# Patient Record
Sex: Female | Born: 1940
Health system: Southern US, Community
[De-identification: ages and names within clinical notes are randomized; demographics above are authoritative.]

## PROBLEM LIST (undated history)

## (undated) DIAGNOSIS — R319 Hematuria, unspecified: Secondary | ICD-10-CM

## (undated) DIAGNOSIS — N200 Calculus of kidney: Secondary | ICD-10-CM

## (undated) DIAGNOSIS — I340 Nonrheumatic mitral (valve) insufficiency: Secondary | ICD-10-CM

## (undated) DIAGNOSIS — R9430 Abnormal result of cardiovascular function study, unspecified: Secondary | ICD-10-CM

## (undated) DIAGNOSIS — I4891 Unspecified atrial fibrillation: Secondary | ICD-10-CM

## (undated) DIAGNOSIS — I8392 Asymptomatic varicose veins of left lower extremity: Secondary | ICD-10-CM

## (undated) DIAGNOSIS — I341 Nonrheumatic mitral (valve) prolapse: Secondary | ICD-10-CM

## (undated) DIAGNOSIS — N189 Chronic kidney disease, unspecified: Secondary | ICD-10-CM

## (undated) DIAGNOSIS — IMO0002 Reserved for concepts with insufficient information to code with codable children: Secondary | ICD-10-CM

## (undated) DIAGNOSIS — C439 Malignant melanoma of skin, unspecified: Secondary | ICD-10-CM

## (undated) DIAGNOSIS — E785 Hyperlipidemia, unspecified: Secondary | ICD-10-CM

## (undated) DIAGNOSIS — Z79899 Other long term (current) drug therapy: Secondary | ICD-10-CM

## (undated) DIAGNOSIS — R87619 Unspecified abnormal cytological findings in specimens from cervix uteri: Secondary | ICD-10-CM

## (undated) DIAGNOSIS — K611 Rectal abscess: Secondary | ICD-10-CM

## (undated) DIAGNOSIS — Z87442 Personal history of urinary calculi: Secondary | ICD-10-CM

## (undated) DIAGNOSIS — I700 Atherosclerosis of aorta: Secondary | ICD-10-CM

## (undated) HISTORY — DX: Atherosclerosis of aorta: I70.0

## (undated) HISTORY — DX: Hyperlipidemia, unspecified: E78.5

## (undated) HISTORY — DX: Malignant melanoma of skin, unspecified: C43.9

## (undated) HISTORY — DX: Nonrheumatic mitral (valve) prolapse: I34.1

## (undated) HISTORY — PX: COLONOSCOPY: SHX174

## (undated) HISTORY — DX: Calculus of kidney: N20.0

## (undated) HISTORY — DX: Rectal abscess: K61.1

## (undated) HISTORY — DX: Chronic kidney disease, unspecified: N18.9

## (undated) HISTORY — DX: Abnormal result of cardiovascular function study, unspecified: R94.30

## (undated) HISTORY — DX: Unspecified abnormal cytological findings in specimens from cervix uteri: R87.619

## (undated) HISTORY — DX: Unspecified atrial fibrillation: I48.91

## (undated) HISTORY — PX: GYNECOLOGIC CRYOSURGERY: SHX857

## (undated) HISTORY — PX: TONSILLECTOMY AND ADENOIDECTOMY: SHX28

## (undated) HISTORY — DX: Nonrheumatic mitral (valve) insufficiency: I34.0

## (undated) HISTORY — DX: Reserved for concepts with insufficient information to code with codable children: IMO0002

## (undated) HISTORY — PX: OTHER SURGICAL HISTORY: SHX169

## (undated) HISTORY — DX: Other long term (current) drug therapy: Z79.899

---

## 1979-06-03 HISTORY — PX: GYNECOLOGIC CRYOSURGERY: SHX857

## 1998-10-25 ENCOUNTER — Other Ambulatory Visit: Admission: RE | Admit: 1998-10-25 | Discharge: 1998-10-25 | Payer: Self-pay | Admitting: *Deleted

## 1999-10-17 ENCOUNTER — Other Ambulatory Visit: Admission: RE | Admit: 1999-10-17 | Discharge: 1999-10-17 | Payer: Self-pay | Admitting: *Deleted

## 1999-12-29 ENCOUNTER — Encounter: Admission: RE | Admit: 1999-12-29 | Discharge: 1999-12-29 | Payer: Self-pay | Admitting: *Deleted

## 1999-12-29 ENCOUNTER — Encounter: Payer: Self-pay | Admitting: *Deleted

## 2000-04-09 ENCOUNTER — Encounter: Payer: Self-pay | Admitting: *Deleted

## 2000-04-09 ENCOUNTER — Encounter: Admission: RE | Admit: 2000-04-09 | Discharge: 2000-04-09 | Payer: Self-pay | Admitting: *Deleted

## 2000-10-24 ENCOUNTER — Other Ambulatory Visit: Admission: RE | Admit: 2000-10-24 | Discharge: 2000-10-24 | Payer: Self-pay | Admitting: *Deleted

## 2001-03-28 ENCOUNTER — Encounter: Admission: RE | Admit: 2001-03-28 | Discharge: 2001-03-28 | Payer: Self-pay | Admitting: *Deleted

## 2001-03-28 ENCOUNTER — Encounter: Payer: Self-pay | Admitting: *Deleted

## 2001-10-24 ENCOUNTER — Other Ambulatory Visit: Admission: RE | Admit: 2001-10-24 | Discharge: 2001-10-24 | Payer: Self-pay | Admitting: *Deleted

## 2002-03-25 ENCOUNTER — Encounter: Admission: RE | Admit: 2002-03-25 | Discharge: 2002-03-25 | Payer: Self-pay | Admitting: *Deleted

## 2002-03-25 ENCOUNTER — Encounter: Payer: Self-pay | Admitting: *Deleted

## 2002-08-02 HISTORY — PX: HYSTEROSCOPY: SHX211

## 2002-11-19 ENCOUNTER — Other Ambulatory Visit: Admission: RE | Admit: 2002-11-19 | Discharge: 2002-11-19 | Payer: Self-pay | Admitting: *Deleted

## 2002-12-02 ENCOUNTER — Encounter: Admission: RE | Admit: 2002-12-02 | Discharge: 2002-12-02 | Payer: Self-pay | Admitting: *Deleted

## 2002-12-02 ENCOUNTER — Encounter: Payer: Self-pay | Admitting: *Deleted

## 2003-04-09 ENCOUNTER — Encounter: Payer: Self-pay | Admitting: *Deleted

## 2003-04-09 ENCOUNTER — Encounter: Admission: RE | Admit: 2003-04-09 | Discharge: 2003-04-09 | Payer: Self-pay | Admitting: *Deleted

## 2003-11-26 ENCOUNTER — Other Ambulatory Visit: Admission: RE | Admit: 2003-11-26 | Discharge: 2003-11-26 | Payer: Self-pay | Admitting: *Deleted

## 2003-12-02 ENCOUNTER — Encounter: Admission: RE | Admit: 2003-12-02 | Discharge: 2003-12-02 | Payer: Self-pay | Admitting: Internal Medicine

## 2004-02-02 ENCOUNTER — Ambulatory Visit (HOSPITAL_COMMUNITY): Admission: RE | Admit: 2004-02-02 | Discharge: 2004-02-02 | Payer: Self-pay | Admitting: Radiology

## 2004-02-25 ENCOUNTER — Ambulatory Visit (HOSPITAL_COMMUNITY): Admission: RE | Admit: 2004-02-25 | Discharge: 2004-02-25 | Payer: Self-pay | Admitting: Radiology

## 2004-04-12 ENCOUNTER — Encounter: Admission: RE | Admit: 2004-04-12 | Discharge: 2004-04-12 | Payer: Self-pay | Admitting: *Deleted

## 2004-11-30 ENCOUNTER — Other Ambulatory Visit: Admission: RE | Admit: 2004-11-30 | Discharge: 2004-11-30 | Payer: Self-pay | Admitting: Obstetrics and Gynecology

## 2004-12-06 ENCOUNTER — Encounter: Admission: RE | Admit: 2004-12-06 | Discharge: 2004-12-06 | Payer: Self-pay | Admitting: Internal Medicine

## 2004-12-06 ENCOUNTER — Encounter: Admission: RE | Admit: 2004-12-06 | Discharge: 2004-12-06 | Payer: Self-pay | Admitting: Urology

## 2004-12-17 ENCOUNTER — Observation Stay (HOSPITAL_COMMUNITY): Admission: EM | Admit: 2004-12-17 | Discharge: 2004-12-17 | Payer: Self-pay | Admitting: Emergency Medicine

## 2004-12-27 ENCOUNTER — Ambulatory Visit: Payer: Self-pay | Admitting: Cardiology

## 2005-01-04 ENCOUNTER — Ambulatory Visit: Payer: Self-pay

## 2005-01-06 ENCOUNTER — Encounter: Admission: RE | Admit: 2005-01-06 | Discharge: 2005-01-06 | Payer: Self-pay | Admitting: Internal Medicine

## 2005-01-12 ENCOUNTER — Ambulatory Visit: Payer: Self-pay | Admitting: Cardiology

## 2005-05-01 ENCOUNTER — Encounter: Admission: RE | Admit: 2005-05-01 | Discharge: 2005-05-01 | Payer: Self-pay | Admitting: *Deleted

## 2005-06-02 ENCOUNTER — Other Ambulatory Visit: Admission: RE | Admit: 2005-06-02 | Discharge: 2005-06-02 | Payer: Self-pay | Admitting: Obstetrics and Gynecology

## 2005-06-08 ENCOUNTER — Encounter: Admission: RE | Admit: 2005-06-08 | Discharge: 2005-06-08 | Payer: Self-pay | Admitting: Internal Medicine

## 2005-12-04 ENCOUNTER — Encounter: Admission: RE | Admit: 2005-12-04 | Discharge: 2005-12-04 | Payer: Self-pay | Admitting: Obstetrics & Gynecology

## 2005-12-04 ENCOUNTER — Other Ambulatory Visit: Admission: RE | Admit: 2005-12-04 | Discharge: 2005-12-04 | Payer: Self-pay | Admitting: Obstetrics & Gynecology

## 2006-05-23 ENCOUNTER — Encounter: Admission: RE | Admit: 2006-05-23 | Discharge: 2006-05-23 | Payer: Self-pay | Admitting: Obstetrics & Gynecology

## 2006-05-28 ENCOUNTER — Encounter: Admission: RE | Admit: 2006-05-28 | Discharge: 2006-05-28 | Payer: Self-pay | Admitting: Urology

## 2006-12-06 ENCOUNTER — Other Ambulatory Visit: Admission: RE | Admit: 2006-12-06 | Discharge: 2006-12-06 | Payer: Self-pay | Admitting: Obstetrics & Gynecology

## 2007-04-02 ENCOUNTER — Encounter: Admission: RE | Admit: 2007-04-02 | Discharge: 2007-04-02 | Payer: Self-pay | Admitting: Urology

## 2007-06-06 ENCOUNTER — Encounter: Admission: RE | Admit: 2007-06-06 | Discharge: 2007-06-06 | Payer: Self-pay | Admitting: Obstetrics & Gynecology

## 2007-12-09 ENCOUNTER — Other Ambulatory Visit: Admission: RE | Admit: 2007-12-09 | Discharge: 2007-12-09 | Payer: Self-pay | Admitting: Obstetrics & Gynecology

## 2008-06-09 ENCOUNTER — Encounter: Admission: RE | Admit: 2008-06-09 | Discharge: 2008-06-09 | Payer: Self-pay | Admitting: Internal Medicine

## 2008-07-08 ENCOUNTER — Ambulatory Visit: Payer: Self-pay | Admitting: Internal Medicine

## 2008-07-28 ENCOUNTER — Ambulatory Visit: Payer: Self-pay

## 2008-07-28 ENCOUNTER — Encounter: Payer: Self-pay | Admitting: Cardiology

## 2008-08-11 ENCOUNTER — Ambulatory Visit: Payer: Self-pay | Admitting: Cardiology

## 2009-06-15 ENCOUNTER — Encounter: Admission: RE | Admit: 2009-06-15 | Discharge: 2009-06-15 | Payer: Self-pay | Admitting: Internal Medicine

## 2009-11-11 ENCOUNTER — Ambulatory Visit: Payer: Self-pay | Admitting: Internal Medicine

## 2009-11-11 ENCOUNTER — Telehealth: Payer: Self-pay | Admitting: Cardiology

## 2009-11-11 ENCOUNTER — Ambulatory Visit: Payer: Self-pay | Admitting: Cardiology

## 2009-12-02 ENCOUNTER — Telehealth: Payer: Self-pay | Admitting: Cardiology

## 2009-12-02 ENCOUNTER — Ambulatory Visit: Payer: Self-pay | Admitting: Internal Medicine

## 2009-12-02 ENCOUNTER — Ambulatory Visit: Payer: Self-pay | Admitting: Cardiology

## 2009-12-14 ENCOUNTER — Ambulatory Visit: Payer: Self-pay | Admitting: Cardiology

## 2009-12-14 ENCOUNTER — Encounter: Payer: Self-pay | Admitting: Cardiology

## 2010-02-04 ENCOUNTER — Encounter: Payer: Self-pay | Admitting: Cardiology

## 2010-02-07 ENCOUNTER — Ambulatory Visit: Payer: Self-pay | Admitting: Cardiology

## 2010-02-24 ENCOUNTER — Encounter: Payer: Self-pay | Admitting: Cardiology

## 2010-06-20 ENCOUNTER — Encounter: Admission: RE | Admit: 2010-06-20 | Discharge: 2010-06-20 | Payer: Self-pay | Admitting: Obstetrics & Gynecology

## 2010-08-08 ENCOUNTER — Ambulatory Visit: Payer: Self-pay | Admitting: Cardiology

## 2010-10-23 ENCOUNTER — Encounter: Payer: Self-pay | Admitting: Urology

## 2010-10-23 ENCOUNTER — Encounter: Payer: Self-pay | Admitting: Radiology

## 2010-11-01 NOTE — Progress Notes (Signed)
Summary: a-fib    Phone Note Call from Patient   Caller: Pts husband Dr Maple Hudson Summary of Call: Received phone call that pt was in a-fib, she ahs taken her calcium channel blocker and it has not helped, pts husband reports that she also had an episode about a month ago but it broke w/med, will bring pt in for EKG at 1pm today Initial call taken by: Meredith Staggers, RN,  November 11, 2009 12:13 PM  Follow-up for Phone Call        pt in for EKG HR 160's, pt seen by Dr Warnell Forester, RN  November 11, 2009 2:03 PM

## 2010-11-01 NOTE — Assessment & Plan Note (Signed)
Summary: rov  Medications Added SIMVASTATIN 40 MG TABS (SIMVASTATIN) Take one tablet by mouth daily at bedtime * VITAMIN D 16109 weekly * ESTROGEN VAGINAL RING  DILT-CD 120 MG XR24H-CAP (DILTIAZEM HCL COATED BEADS) as needed DILT-CD 120 MG XR24H-CAP (DILTIAZEM HCL COATED BEADS) Take 1 capsule by mouth once a day      Allergies Added: * TETANUS  History of Present Illness: [name] (the wife of Dr. Stevphen Meuse) is a 70 y/o woman with h/o lone PAF and hyperlipidemia. She is followed by Dr. Myrtis Ser who has not seen her in several years as she has been doing well.  She tells me over the past 7 months has had 2 episodes of AF after drinking alcohol or having some caffeine. Both episodes were short and terminated about an hour after taking diltiazem.  Today woke up with palpitations. No CP or SOB. Took diltiazem but palpitations persisted. Came to office and had ECG which showed AF with RVR 169. While in office AF broke back to sinus.  Continue to exercise regularly without CP or SOB.  Current Medications (verified): 1)  Simvastatin 40 Mg Tabs (Simvastatin) .... Take One Tablet By Mouth Daily At Bedtime 2)  Vitamin D .... 50000 Weekly 3)  Estrogen Vaginal Ring 4)  Dilt-Cd 120 Mg Xr24h-Cap (Diltiazem Hcl Coated Beads) .... As Needed  Allergies (verified): 1)  * Tetanus  Past History:  Past Medical History: PAF   --echo normal with tirvial MR Hyperlipidemia  Review of Systems       As per HPI and past medical history; otherwise all systems negative.   Vital Signs:  Patient profile:   70 year old female Weight:      140 pounds Pulse rate:   169 / minute BP sitting:   112 / 60  (left arm)  Vitals Entered By: Meredith Staggers, RN (November 11, 2009 1:46 PM)  Physical Exam  General:  Gen: well appearing. thin no resp difficulty HEENT: normal Neck: supple. no JVD. Carotids 2+ bilat; no bruits. No lymphadenopathy or thryomegaly appreciated. Cor: PMI nondisplaced. Regular  rate & rhythm. No rubs, gallops, murmur. Lungs: clear Abdomen: soft, nontender, nondistended.Good bowel sounds. Extremities: no cyanosis, clubbing, rash, edema Neuro: alert & orientedx3, cranial nerves grossly intact. moves all 4 extremities w/o difficulty. affect pleasant    Impression & Recommendations:  Problem # 1:  ATRIAL FIBRILLATION (ICD-427.31) She now has had 3 episodes of lone PAF (CHADS 0) in past 7 months with decreasing triggers and despite signifcant lifestyle modifications to prevent. I discussed the options with her and her husband.  1) Continue current therapy with diltiazem as needed  2)  Use flecainide as pill-in-pocket approach 3) Start flecainide 50 two times a day + low dose AV nodal blocker to help prevent PAF recurrence  Given the increasing frequency I favor the 3rd option but she seems to favor the 2nd. We did discuss the risks of proarrythmia. I will discuss with Dr. Myrtis Ser as well. Would suggest ETT to evaluate for underlying CAD.  Increase ASA to 325.  I discussed with Dr. Myrtis Ser and he favored trying her on Diltiazem 120qd (verus as needed) and proceeding with ETT. If ETT negative and she continues to have PAF, he will then consider flecainide with pill in pocket approach. I think this is a great way to approach it and we gave her a script for the diltaizem and arranged the stress test.   Orders: EKG w/ Interpretation (93000) Treadmill (Treadmill)  Patient Instructions:  1)  Your physician has requested that you have an exercise tolerance test.  For further information please visit [email or web address]  Please also follow instruction sheet, as given. Prescriptions: DILT-CD 120 MG XR24H-CAP (DILTIAZEM HCL COATED BEADS) Take 1 capsule by mouth once a day  #30 x 6   Entered by:   Meredith Staggers, RN   Authorized by:   Dolores Patty, MD, [facility]   Signed by:   Meredith Staggers, RN on 11/11/2009   Method used:   [facility]        [facility] [address]  [phone]* (retail)       [address]       [place], [place]  [address]       Ph: [phone] or [phone]       Fax: [phone]   RxID:   [id number]

## 2010-11-01 NOTE — Assessment & Plan Note (Signed)
Summary: [place] [facility]  Medications Added FLECAINIDE ACETATE 50 MG TABS (FLECAINIDE ACETATE) Take one tablet by mouth every 12 hours, take 4 tabs tonight      Allergies Added:   Visit Type:  add on Primary Provider:  Rodrigo Ran MD  CC:  a fib.  History of Present Illness: [name] (the wife of Dr. Stevphen Meuse) is a 70 y/o woman with h/o lone PAF and hyperlipidemia. She is followed by Dr. Myrtis Ser.  I saw her on February 10 for an episode of AF. Given that it was her 3rd episode in the 7 months we discussed possible initiation of Flecainide but given the fact that sehe didn't have recent stress test we decided to change her diltiazem from as needed to scheduled dosing. She has stress test scheduled for late March.  She walked into the office today c/o recurrent palpitations. Last night had mild irregular HR so she took an extra dose of diltiazem but HR still fast. Palpitations recurred today so she came for evalaution. ECG here shows AF at 124 bpm.   Had emergency root canal on Tuesday and has been taking pain meds.  HR when in sinus rhythm tends to be in the 80s-90s.   Current Medications (verified): 1)  Simvastatin 40 Mg Tabs (Simvastatin) .... Take One Tablet By Mouth Daily At Bedtime 2)  Vitamin D .... 50000 Weekly 3)  Estrogen Vaginal Ring 4)  Dilt-Cd 120 Mg Xr24h-Cap (Diltiazem Hcl Coated Beads) .... Take 1 Capsule By Mouth Once A Day  Allergies (verified): 1)  * Tetanus  Past History:  Past Medical History: Last updated: 11/11/2009 PAF   --echo normal with tirvial MR Hyperlipidemia  Review of Systems       As per HPI and past medical history; otherwise all systems negative.   Vital Signs:  Patient profile:   70 year old female Height:      66 inches Weight:      142 pounds BMI:     23.00 Pulse rate:   124 / minute BP sitting:   102 / 64  (left arm) Cuff size:   regular  Vitals Entered By: Hardin Negus, RMA (December 02, 2009 4:53 PM)  Physical  Exam  General:  Gen: well appearing. thin no resp difficulty HEENT: normal Neck: supple. no JVD. Carotids 2+ bilat; no bruits. No lymphadenopathy or thryomegaly appreciated. Cor: PMI nondisplaced.Irregular tachy. No rubs, gallops, murmur. Lungs: clear Abdomen: soft, nontender, nondistended.Good bowel sounds. Extremities: no cyanosis, clubbing, rash, edema Neuro: alert & orientedx3, cranial nerves grossly intact. moves all 4 extremities w/o difficulty. affect pleasant    Impression & Recommendations:  Problem # 1:  ATRIAL FIBRILLATION (ICD-427.31) Recurrent with 4 known symptomatic episodes in 8 months despite calcium channel blockade. Will start low-dose flelcainide at 50 two times a day. She will take 200mg  today. May need to increase to 75mg  (or 100)  two times a day to keep AF in check. Continue diltiazem and ASA 325. We have moved ETT up to next week to rule out pro-arrhthymia effect of Flecainide (we discussed this potential side effect).  F/u with Dr. Myrtis Ser.   Other Orders: EKG w/ Interpretation (93000)  Patient Instructions: 1)  Flecanide 50mg  two times a day, take 4 tabs tonight Prescriptions: FLECAINIDE ACETATE 50 MG TABS (FLECAINIDE ACETATE) Take one tablet by mouth every 12 hours, take 4 tabs tonight  #64 x 3   Entered by:   Meredith Staggers, RN   Authorized by:  Dolores Patty, MD, [facility]   Signed by:   Meredith Staggers, RN on 12/02/2009   Method used:   [facility] to        [facility] [address] [phone]* (retail)       [address]       [place], [place]  [address]       Ph: [phone] or [phone]       Fax: [phone]   RxID:   [id number]

## 2010-11-01 NOTE — Assessment & Plan Note (Signed)
Summary: per check out/sf  Medications Added FLECAINIDE ACETATE 50 MG TABS (FLECAINIDE ACETATE) Take one tablet by mouth every 12 hours FLECAINIDE ACETATE 50 MG TABS (FLECAINIDE ACETATE) Take one tablet by mouth every 12 hours      Allergies Added:   Visit Type:  Follow-up Primary Provider:  Rodrigo Ran MD  CC:  Atrial fibrillation.  History of Present Illness: The patient is seen for followup of atrial fibrillation.  She has not had any recurrent atrial fib since low-dose flecainide was started.  Of course her episodes in the past had been very infrequent.  She is only 50 mg b.i.d.  We did a treadmill to be sure that she has no stress induced ectopy.  She feels well and is not having any particular problems.  Current Medications (verified): 1)  Simvastatin 40 Mg Tabs (Simvastatin) .... Take One Tablet By Mouth Daily At Bedtime 2)  Vitamin D .... 50000 Weekly 3)  Estrogen Vaginal Ring 4)  Dilt-Cd 120 Mg Xr24h-Cap (Diltiazem Hcl Coated Beads) .... Take 1 Capsule By Mouth Once A Day 5)  Flecainide Acetate 50 Mg Tabs (Flecainide Acetate) .... Take One Tablet By Mouth Every 12 Hours  Allergies (verified): 1)  * Tetanus  Past History:  Past Medical History: PAF --echo normal with tirvial MR  / recurrent episodes  March, 2011... CHADS2 score 0 EF 55-65%... echo... October, 2009 Mitral regurgitation mild.... echo.... October, 2009 Hyperlipidemia Flecainide Rx.... started December 02, 2009  /  treadmill December 14, 2009.... no exercise-induced ectopy  Review of Systems       Patient denies fever, chills, headache, sweats, rash, change in vision, change in hearing, chest pain, cough, nausea vomiting, urinary symptoms.  All other systems are reviewed and are negative  Vital Signs:  Patient profile:   70 year old female Height:      66 inches Weight:      137 pounds BMI:     22.19 Pulse rate:   75 / minute BP sitting:   104 / 66  (left arm) Cuff size:   regular  Vitals Entered By:  Hardin Negus, RMA (Feb 07, 2010 2:04 PM)  Physical Exam  General:  patient is quite stable in general. Eyes:  no xanthelasma. Neck:  no jugular venous distention. Lungs:  lungs are clear.  Respiratory effort is nonlabored. Heart:  cardiac exam reveals an S1-S2.  There are no clicks or significant murmurs. Abdomen:  abdomen is soft. Extremities:  no peripheral edema. Psych:  patient is oriented to person time and place.  Affect is normal.   Impression & Recommendations:  Problem # 1:  MITRAL REGURGITATION (ICD-396.3) mitral regurgitation is mild.  No further workup is needed.  Problem # 2:  HYPERLIPIDEMIA-MIXED (ICD-272.4)  Her updated medication list for this problem includes:    Simvastatin 40 Mg Tabs (Simvastatin) .Marland Kitchen... Take one tablet by mouth daily at bedtime Lipids are treated.  Problem # 3:  ATRIAL FIBRILLATION (ICD-427.31)  Her updated medication list for this problem includes:    Flecainide Acetate 50 Mg Tabs (Flecainide acetate) .Marland Kitchen... Take one tablet by mouth every 12 hours Rhythm is stable on physical exam.  She is doing well.  I decided to obtain a trough level.  It would be very unlikely that her level is high. The information would be helpful going forward if we were to decide to go to a higher dose.  Followup in 6 months.  Patient Instructions: 1)  Follow up in 6 months Prescriptions:  FLECAINIDE ACETATE 50 MG TABS (FLECAINIDE ACETATE) Take one tablet by mouth every 12 hours  #180 x 4   Entered by:   Hardin Negus, RMA   Authorized by:   Talitha Givens, MD, [facility]   Signed by:   Hardin Negus, RMA on 02/07/2010   Method used:   [facility] to        [facility] [address] [phone]* (retail)       [address]       [place], [place]  [address]       Ph: [phone] or [phone]       Fax: [phone]   RxID:   [id number] DILT-CD 120 MG XR24H-CAP (DILTIAZEM HCL COATED BEADS) Take 1 capsule by mouth once a day  #90 x 4    Entered by:   Hardin Negus, RMA   Authorized by:   Talitha Givens, MD, [facility]   Signed by:   Hardin Negus, RMA on 02/07/2010   Method used:   Electronically to        [facility] [address] [phone]* (retail)       [address]       [place], [place]  [address]       Ph: [phone] or [phone]       Fax: [phone]   RxID:   [id number]

## 2010-11-01 NOTE — Miscellaneous (Signed)
  Clinical Lists Changes  Problems: Added new problem of * FLECAINIDE RX Added new problem of MITRAL REGURGITATION (ICD-396.3) Observations: Added new observation of PAST MED HX: PAF --echo normal with tirvial MR EF 55-65%... echo... October, 2009 Mitral regurgitation mild.... echo.... October, 2009 Hyperlipidemia Flecainide Rx.... started December 02, 2009  (12/14/2009 12:29) Added new observation of PRIMARY MD: Rodrigo Ran MD (12/14/2009 12:29)       Past History:  Past Medical History: PAF --echo normal with tirvial MR EF 55-65%... echo... October, 2009 Mitral regurgitation mild.... echo.... October, 2009 Hyperlipidemia Flecainide Rx.... started December 02, 2009

## 2010-11-01 NOTE — Assessment & Plan Note (Signed)
Summary: [facility]    Visit Type:  treadmill Primary Provider:  Rodrigo Ran MD  CC:  atrial fibrillation.  History of Present Illness: The patient was here today for a standard treadmill.  She is on Flecainide 50 mg p.o. b.i.d.  Treadmill is done to be sure that she does not have any exercise induced arrhythmias.  She exercised well.  There were no arrhythmias.  She has not had any atrial fib since low-dose flecainide was started.  We will obtain a trough level.  She will remain on low dose diltiazem along with the flecainide.  If she has a recurrent episode she will take an extra dose of 100 mg of flecainide.  I will see her back in 2 months.  Allergies: 1)  * Tetanus

## 2010-11-01 NOTE — Miscellaneous (Signed)
  Clinical Lists Changes  Observations: Added new observation of PAST MED HX: PAF --echo normal with tirvial MR  / recurrent episodes  March, 2011 EF 55-65%... echo... October, 2009 Mitral regurgitation mild.... echo.... October, 2009 Hyperlipidemiaa Flecainide Rx.... started December 02, 2009  /  treadmill December 14, 2009.... no exercise-induced ectopy  (02/04/2010 13:07) Added new observation of PRIMARY MD: Rodrigo Ran MD (02/04/2010 13:07)       Past History:  Past Medical History: PAF --echo normal with tirvial MR  / recurrent episodes  March, 2011 EF 55-65%... echo... October, 2009 Mitral regurgitation mild.... echo.... October, 2009 Hyperlipidemiaa Flecainide Rx.... started December 02, 2009  /  treadmill December 14, 2009.... no exercise-induced ectopy

## 2010-11-01 NOTE — Progress Notes (Signed)
Summary: a-fib   Phone Note Call from Patient   Caller: husband Dr Maple Hudson  [phone] Summary of Call: pts husband called and reported she still having a-fib and wanted to know if it was ok to take extra diltiazem, per Dr Gala Romney that is ok  Follow-up for Phone Call        pt walked into the office before she could be called backed, see nurse visit Meredith Staggers, RN  December 02, 2009 3:48 PM      Appended Document: a-fib Saw patient as a walk in complaining of possibly being in atrial fib. She states that she took another long acting Diltiazem at about 320 pm because her heart rate was fast. EKG showed atrial fib rate 120 to 140 with a BP of 120/80 and RR of 18 .Discussed case with Dr. Gala Romney...he advised I give her Diltiazem 60mg  by mouth...given at 345 pm. and he will see her for an MD visit.

## 2010-11-01 NOTE — Assessment & Plan Note (Signed)
Summary: f21m  Medications Added CALCIUM CARBONATE-VITAMIN D 600-400 MG-UNIT  TABS (CALCIUM CARBONATE-VITAMIN D) once daily VITAMIN B COMPLEX-C   CAPS (B COMPLEX-C) once daily ASPIRIN EC 325 MG TBEC (ASPIRIN) Take one tablet by mouth daily FISH OIL   OIL (FISH OIL) 1000mg  two times a day MULTIVITAMINS   TABS (MULTIPLE VITAMIN) once daily MAGNESIUM 500 MG TABS (MAGNESIUM) once daily      Allergies Added:   Visit Type:  Follow-up Primary Provider:  Rodrigo Ran MD  CC:  atrial fibrillation.  History of Present Illness: The patient is seen for followup of paroxysmal atrial fibrillation.  I saw her last May, 2011.  We checked a flecainide level on March 03, 2010.  Level was 0.28 which is below therapeutic range.  The patient has had no recurrence of her atrial fibrillation.  She is fully active.  Current Medications (verified): 1)  Simvastatin 40 Mg Tabs (Simvastatin) .... Take One Tablet By Mouth Daily At Bedtime 2)  Vitamin D .... 50000 Weekly 3)  Estrogen Vaginal Ring 4)  Dilt-Cd 120 Mg Xr24h-Cap (Diltiazem Hcl Coated Beads) .... Take 1 Capsule By Mouth Once A Day 5)  Flecainide Acetate 50 Mg Tabs (Flecainide Acetate) .... Take One Tablet By Mouth Every 12 Hours 6)  Calcium Carbonate-Vitamin D 600-400 Mg-Unit  Tabs (Calcium Carbonate-Vitamin D) .... Once Daily 7)  Vitamin B Complex-C   Caps (B Complex-C) .... Once Daily 8)  Aspirin Ec 325 Mg Tbec (Aspirin) .... Take One Tablet By Mouth Daily 9)  Fish Oil   Oil (Fish Oil) .... 1000mg  Two Times A Day 10)  Multivitamins   Tabs (Multiple Vitamin) .... Once Daily 11)  Magnesium 500 Mg Tabs (Magnesium) .... Once Daily  Allergies (verified): 1)  * Tetanus  Past History:  Past Medical History: Last updated: 02/07/2010 PAF --echo normal with tirvial MR  / recurrent episodes  March, 2011... CHADS2 score 0 EF 55-65%... echo... October, 2009 Mitral regurgitation mild.... echo.... October, 2009 Hyperlipidemia Flecainide Rx.... started  December 02, 2009  /  treadmill December 14, 2009.... no exercise-induced ectopy  Review of Systems       Patient denies fever, chills, headache, sweats, rash, change in vision, change in hearing, chest pain, cough, nausea vomiting, urinary symptoms.  All other systems are reviewed and are negative.  Vital Signs:  Patient profile:   70 year old female Height:      66 inches Weight:      142 pounds BMI:     23.00 Pulse rate:   80 / minute BP sitting:   110 / 70  (left arm) Cuff size:   regular  Vitals Entered By: Hardin Negus, RMA (August 08, 2010 2:00 PM)  Physical Exam  General:  patient looks quite good. Eyes:  no xanthelasma. Neck:  no jugular venous extension. Lungs:  lungs are clear respiratory effort is not labored. Heart:  cardiac exam reveals S1-S2.  No clicks or significant murmurs. Abdomen:  abdomen is soft. Msk:  no musculoskeletal deformities. Extremities:  no peripheral edema. Psych:  patient is oriented to person time and place.  Affect is normal.   Impression & Recommendations:  Problem # 1:  MITRAL REGURGITATION (ICD-396.3) Mitral regurgitation was very mild in the past.  She does not need a followup echo at this time.  Problem # 2:  * FLECAINIDE RX The patient does very well with low-dose flecainide.  There is no reason to repeat a level or to make any changes.  Problem #  3:  HYPERLIPIDEMIA-MIXED (ICD-272.4)  Her updated medication list for this problem includes:    Simvastatin 40 Mg Tabs (Simvastatin) .Marland Kitchen... Take one tablet by mouth daily at bedtime Lipids are treated by her primary physician.  Problem # 4:  ATRIAL FIBRILLATION (ICD-427.31)  Her updated medication list for this problem includes:    Flecainide Acetate 50 Mg Tabs (Flecainide acetate) .Marland Kitchen... Take one tablet by mouth every 12 hours    Aspirin Ec 325 Mg Tbec (Aspirin) .Marland Kitchen... Take one tablet by mouth daily No recurrent clinical atrial fibrillation.  There is no indication for Coumadin.  One  year cardiology follow.  Patient Instructions: 1)  Your physician wants you to follow-up in: 1 year  You will receive a reminder letter in the mail two months in advance. If you don't receive a letter, please call our office to schedule the follow-up appointment.

## 2011-02-14 NOTE — Assessment & Plan Note (Signed)
[facility]                            CARDIOLOGY OFFICE NOTE   NAME:[name], [name]                     MRN:          [id number]  DATE:07/08/2008                            DOB:          Nov 03, 1940    PRIMARY CARDIOLOGIST:  Luis Abed, MD, [facility]   This is a very pleasant 70 year old married white female patient who saw  Dr. Myrtis Ser back in April 2006, at which time she had an episode of atrial  fibrillation.  This was felt related to a combination of a viral  illness, taking decongestant and caffeine.  She was placed on low-dose  Cardizem short term and given a prescription eventually for Cartia to  have on hand.  Echo at that time was normal.   The patient has not been seen in 3 years.  She was traveling in  [place] with her husband and was sitting down to meal.  She  was eating some chocolate mousse and was drinking a vodka and tonic,  which was stronger than what she normally drinks.  She stated she went  into atrial fibrillation.  She stated her heart was racing and very  irregular.  She had the 75-year-old Cartia in her purse and took one, and  her symptoms resolved after 45 minutes.  She denied any chest pain,  dizziness, or presyncope with this episode.  She is now wondering today  if it had anything to do with the alcohol or caffeine or the chocolate  that she ate.  She has not had any further episodes.   CURRENT MEDICATIONS:  1. Fish oil 1000 mg daily.  2. Super-B complex and C daily.  3. Calcium and D daily.  4. Zocor 40 mg nightly.  5. Estrogen ring.  6. Aspirin 81 mg daily.  7. Multivitamin daily.   PHYSICAL EXAMINATION:  GENERAL:  This is a very pleasant 70 year old  white female in no acute distress.  VITAL SIGNS:  Blood pressure 113/68, pulse 71, weight 143.  NECK:  Without JVD, HJR, bruit, or thyroid enlargement.  LUNGS:  Clear anterior, posterior, and lateral.  HEART:  Regular rate and rhythm at 71 beats per  minute.  Normal S1 and  S2.  No murmur, rub, bruit, thrill, or heave noted.  ABDOMEN:  Soft without organomegaly, masses, lesions, or abnormal  tenderness.  EXTREMITIES:  Without cyanosis, clubbing, or edema.  She has good distal  pulses.   EKG normal sinus rhythm, normal EKG.   IMPRESSION:  1. Paroxysmal atrial fibrillation with recurrent episode last week,      last episode 3 years ago.  2. Hyperlipidemia, treated.  3. Question mitral valve prolapse in the past, echo has never      documented this.   PLAN:  I have given the patient prescription for Cartia 120 mg to take  p.r.n.  We will do a 2-D echo on her to make sure there has not been any  change and she will follow up with Dr. Myrtis Ser.      Jacolyn Reedy, PA-C  Electronically Signed      Doylene Canning.  Ladona Ridgel, MD  Electronically Signed   ML/MedQ  DD: 07/08/2008  DT: 07/09/2008  Job #: [id number]

## 2011-02-14 NOTE — Assessment & Plan Note (Signed)
[facility]                            CARDIOLOGY OFFICE NOTE   NAME:Leathers, SUKHMANI FETHEROLF                     MRN:          [id number]  DATE:08/11/2008                            DOB:          [birth year]    [name] (the wife of Dr. Stevphen Meuse) is here for cardiology  followup.  I have followed her in the past.  She is a delightful healthy  woman.  She had an episode of atrial fibrillation in April 2006.  She  has not had any problems since then until very recently.  She was  traveling and had had some chocolate and a stronger alcoholic drink than  usual.  She was actually feeling fine, but noticed some increased heart  rate.  She has carried calcium-blocker in her wallet for many years, and  she took a dose.  She felt better and had no reoccurrence.  In my  absence, she was seen by Wende Bushy, PAC in our office on July 08, 2008, and she was quite stable.  She arranged for 2D echocardiogram  followup and had Mrs. Cunliffe carry calcium-blocker.  She has had no  recurrence since then.  Her 2D echocardiogram was done on July 28, 2008.  She has excellent LV function.  She has no major mitral valve  problems.  There is a trivial mitral regurgitation.   PAST MEDICAL HISTORY:   ALLERGIES:  No known drug allergies.   MEDICATIONS:  Fish oil, vitamins, Zocor, estrogen, aspirin, calcium, and  diltiazem 120 that she carries as needed.   OTHER MEDICAL PROBLEMS:  See the list below.   REVIEW OF SYSTEMS:  She has no GI or GU symptoms.  She has no headaches  or chills or fever.  Her review of systems is negative.   PHYSICAL EXAMINATION:  VITAL SIGNS:  Blood pressure is 104/60 with a  pulse of 78.  GENERAL:  The patient is oriented to person, time, and place.  Affect is  normal.  HEENT:  No xanthelasma.  She has normal extraocular motion.  NECK:  There are no carotid bruits.  There is no jugular venous tension.  LUNGS:  Clear.  Respiratory effort  is not labored.  CARDIAC:  S1 with an S2.  There are no clicks or significant murmurs.  ABDOMEN:  Soft.  She has no peripheral edema.   PROBLEMS:  1. History of 1 episode of atrial fibrillation in 2006, and another      very brief episode of very recently with no recurrence.  2. Hyperlipidemia, treated.  3. Question of mitral valve prolapse in the past, but her current      echocardiogram shows no major mitral abnormalities.   The patient does not need Coumadin.  She does not need any other  medicines at this time.  We will follow her.  If she has recurring  episodes overtime, I will be happy to see her.  It will be okay for her  to resume a small amount of chocolate and alcohol in moderation.     Luis Abed, MD, [facility]  Electronically  Signed    JDK/MedQ  DD: 08/11/2008  DT: 08/12/2008  Job #: [id number]   cc:   Loraine Leriche A. Perini, M.D.

## 2011-02-17 NOTE — Discharge Summary (Signed)
NAMEJASREET, [name]              ACCOUNT NO.:  [id number]   MEDICAL RECORD NO.:  [id number]          PATIENT TYPE:  INP   LOCATION:  3732                         FACILITY:  [facility]   PHYSICIAN:  Olga Millers, M.D. LHCDATE OF BIRTH:  08/28/1941   DATE OF ADMISSION:  12/17/2004  DATE OF DISCHARGE:  12/17/2004                                 DISCHARGE SUMMARY   DISCHARGE DIAGNOSIS:  Atrial fibrillation converted to normal sinus rhythm,  new onset.   PAST MEDICAL HISTORY:  1.  Mitral valve prolapse.  2.  Hyperlipidemia.  3.  History of microscopic hematuria.   DISCHARGE MEDICATIONS:  1.  Cardizem CD 120 mg p.o. daily.  2.  Zocor 40 mg daily as previously.  3.  Aspirin 325 mg daily.   FOLLOWUP:  Dr. Myrtis [name] at the end of this coming week.  Our office will call  and arrange an appointment time.   HISTORY OF PRESENT ILLNESS:  Ms. [name] is a pleasant 70 year old female with  past history stated above who presented with new onset atrial fibrillation.  The patient states she had seen Dr. Myrtis [name] in the remote past.  Her primary  care physician is Dr. ____________Mack in [place].  On day of admission  around 11:30 p.m., she developed sudden onset of palpitations without any  associated shortness of breath, chest pain, or presyncope.  She presented ot  the [facility] Emergency Room.  Cardiac enzymes negative.  EKG showing  normal sinus rhythm converting to atrial fibrillation with a rapid  ventricular response with no specific ST changes.  The patient was admitted  to telemetry, placed on IV Cardizem, heparin for anticoagulation therapy.  The patient converted to normal sinus rhythm.  She was seen by Dr. Willa [name].  Plan is to discharge the patient to follow up as instructed above.  TSH level was pending.      MB/MEDQ  D:  12/17/2004  T:  12/18/2004  Job:  [id number]

## 2011-02-17 NOTE — H&P (Signed)
[name], [name]              ACCOUNT NO.:  [id number]   MEDICAL RECORD NO.:  [id number]          PATIENT TYPE:  INP   LOCATION:  3732                         FACILITY:  [facility]   PHYSICIAN:  Olga Millers, M.D. LHCDATE OF BIRTH:  1940/10/10   DATE OF ADMISSION:  12/17/2004  DATE OF DISCHARGE:                                HISTORY & PHYSICAL   HISTORY OF PRESENT ILLNESS:  [name] is a very pleasant 70 year old  female with past medical history with mitral valve prolapse, hyperlipidemia,  who presents with atrial fibrillation.  The patient has previously been seen  by Dr. Myrtis Ser, but it has been quite some time.  She typically does not have  dyspnea on exertion, orthopnea, PND, pedal edema, palpitations, presyncope,  syncope or exertional chest pain.  Tonight, at approximately 11:30 p.m., she  developed a sudden onset of palpitations.  There was no associated shortness  of breath, chest pain or presyncope.  The symptoms persisted and she  presented to the emergency room for further evaluation.  She was found to be  in atrial fibrillation and we were asked to further evaluate.  Of note, she  has had palpitations briefly in the past, but none in the past 2 years.  They have lasted for approximately 1 minute previously.   ALLERGIES:  She has no known drug allergies.   MEDICATIONS:  Her medications include:  1.  Zocor 40 mg p.o. nightly.  2.  Vitamins.   SOCIAL HISTORY:  She does not smoke.  She does occasionally consume alcohol.   FAMILY HISTORY:  Her family history is negative for coronary artery disease.   PAST MEDICAL HISTORY:  There is no diabetes mellitus, or hypertension, but  there is hyperlipidemia.  She has a history of mild mitral valve prolapse by  report.  She recently was noted to have microscopic hematuria and is being  evaluated for that.  She apparently has been found to have nephrolithiasis  by a CT scan.  She has had prior tonsillectomy.   REVIEW OF  SYSTEMS:  She denies any headaches or fever or chills.  There is  no productive cough or hemoptysis.  There is no dysphagia, odynophagia,  melena or hematochezia.  There is no dysuria or hematuria.  There is no rash  or seizure activity.  There is no orthopnea, PND or pedal edema.  There is  no claudication noted.  The remaining systems are negative.   PHYSICAL EXAM:  VITAL SIGNS:  On physical exam, her blood pressure is 90/60  and her pulse is 146.  GENERAL:  She is well-developed and well-nourished, in no acute distress.  Her skin is warm and dry.  She does not appear to be depressed and there is  no peripheral clubbing.  HEENT:  Unremarkable with normal eyelids.  NECK:  Her neck is supple with a normal upstroke bilaterally and I cannot  appreciate bruits.  There is no jugular venous distention and I cannot  appreciate thyromegaly.  CHEST:  Her chest is clear to auscultation with normal expansion.  CARDIOVASCULAR:  Exam reveals a tachycardic rate and  an irregular rhythm.  There are no murmurs, rubs, or gallops noted.  ABDOMINAL EXAM:  Not tender or distended.  Positive bowel sounds.  No  hepatosplenomegaly and no masses appreciated.  There is no abdominal bruit.  She has 2+ femoral pulses bilaterally and no bruits.  EXTREMITIES:  Extremities show no edema and I can palpate no cords.  She has  2+ posterior tibial pulses bilaterally.  NEUROLOGICAL:  Exam is grossly intact.   ACCESSORY CLINICAL DATA:  Her electrocardiogram shows a normal sinus rhythm  converting to atrial fibrillation with a rapid ventricular response.  The  axis is normal.  There are nonspecific ST changes.   DIAGNOSES:  1.  New-onset atrial fibrillation.  2.  History of mitral valve prolapse.  3.  Hyperlipidemia.  4.  History of microscopic hematuria.   PLAN:  [name] has developed atrial fibrillation.  We will admit to a  telemetry bed and we will add IV Cardizem for rate control.  We may also  need to add  digoxin, as her heart rate is increased in the emergency room  after a 15-mg bolus and her blood pressure is borderline.  We will rule out  myocardial infarction with serial enzymes, although I think ischemia is  unlikely.  We will check a TSH to exclude hyperthyroidism.  The patient has  no embolic risk factors (no hypertension, diabetes mellitus, prior CVA and  her age is less 62).  We will add heparin for now.  However, if she converts  to normal sinus rhythm, then I would discontinue on aspirin and not long-  term Coumadin at this point.  She will need long-term Cardizem for rate  control.  We will schedule her for an outpatient echocardiogram if she  converts and a followup with Dr. Myrtis Ser.      BC/MEDQ  D:  12/17/2004  T:  12/17/2004  Job:  [id number]

## 2011-02-28 ENCOUNTER — Other Ambulatory Visit: Payer: Self-pay | Admitting: Cardiology

## 2011-03-05 ENCOUNTER — Other Ambulatory Visit: Payer: Self-pay | Admitting: Cardiology

## 2011-05-25 ENCOUNTER — Other Ambulatory Visit: Payer: Self-pay | Admitting: Obstetrics & Gynecology

## 2011-05-25 DIAGNOSIS — Z1231 Encounter for screening mammogram for malignant neoplasm of breast: Secondary | ICD-10-CM

## 2011-06-16 ENCOUNTER — Other Ambulatory Visit: Payer: Self-pay | Admitting: Obstetrics & Gynecology

## 2011-06-16 DIAGNOSIS — Z780 Asymptomatic menopausal state: Secondary | ICD-10-CM

## 2011-07-03 ENCOUNTER — Ambulatory Visit
Admission: RE | Admit: 2011-07-03 | Discharge: 2011-07-03 | Disposition: A | Discharge status: Home / Self Care | Payer: Medicare Other | Source: Ambulatory Visit | Attending: Obstetrics & Gynecology | Admitting: Obstetrics & Gynecology

## 2011-07-03 DIAGNOSIS — Z1231 Encounter for screening mammogram for malignant neoplasm of breast: Secondary | ICD-10-CM

## 2011-07-03 DIAGNOSIS — Z780 Asymptomatic menopausal state: Secondary | ICD-10-CM

## 2011-07-10 ENCOUNTER — Ambulatory Visit
Admission: RE | Admit: 2011-07-10 | Discharge: 2011-07-10 | Disposition: A | Discharge status: Home / Self Care | Payer: Medicare Other | Source: Ambulatory Visit | Attending: Internal Medicine | Admitting: Internal Medicine

## 2011-07-10 ENCOUNTER — Other Ambulatory Visit: Payer: Self-pay | Admitting: Internal Medicine

## 2011-07-10 DIAGNOSIS — Z0000 Encounter for general adult medical examination without abnormal findings: Secondary | ICD-10-CM

## 2011-07-25 ENCOUNTER — Encounter: Payer: Self-pay | Admitting: Vascular Surgery

## 2011-08-16 ENCOUNTER — Encounter: Payer: Self-pay | Admitting: Cardiology

## 2011-08-16 DIAGNOSIS — E785 Hyperlipidemia, unspecified: Secondary | ICD-10-CM | POA: Insufficient documentation

## 2011-08-16 DIAGNOSIS — Z79899 Other long term (current) drug therapy: Secondary | ICD-10-CM | POA: Insufficient documentation

## 2011-08-16 DIAGNOSIS — I4891 Unspecified atrial fibrillation: Secondary | ICD-10-CM | POA: Insufficient documentation

## 2011-08-16 DIAGNOSIS — R9430 Abnormal result of cardiovascular function study, unspecified: Secondary | ICD-10-CM | POA: Insufficient documentation

## 2011-08-16 DIAGNOSIS — I340 Nonrheumatic mitral (valve) insufficiency: Secondary | ICD-10-CM | POA: Insufficient documentation

## 2011-08-17 ENCOUNTER — Encounter: Payer: Self-pay | Admitting: Cardiology

## 2011-08-17 ENCOUNTER — Ambulatory Visit (INDEPENDENT_AMBULATORY_CARE_PROVIDER_SITE_OTHER): Payer: Medicare Other | Admitting: Cardiology

## 2011-08-17 DIAGNOSIS — Z79899 Other long term (current) drug therapy: Secondary | ICD-10-CM

## 2011-08-17 DIAGNOSIS — Z5189 Encounter for other specified aftercare: Secondary | ICD-10-CM

## 2011-08-17 DIAGNOSIS — I340 Nonrheumatic mitral (valve) insufficiency: Secondary | ICD-10-CM

## 2011-08-17 DIAGNOSIS — I059 Rheumatic mitral valve disease, unspecified: Secondary | ICD-10-CM

## 2011-08-17 DIAGNOSIS — I4891 Unspecified atrial fibrillation: Secondary | ICD-10-CM

## 2011-08-17 NOTE — Assessment & Plan Note (Addendum)
The patient has had no recurrence of atrial fibrillation.  She continues on low dose Flecainide.  We know that her level was low therapeutic in the past.  I have chosen not to repeat a level at this time.  I will leave her on the medication at this point.  Her stroke risk from atrial fib is extremely low.  She does not need anticoagulation at this time. The patient has been on aspirin for this purpose.  There is no data that says that 325 is any better than 81 mg.  Therefore she was switched to 81 mg of aspirin daily.

## 2011-08-17 NOTE — Assessment & Plan Note (Signed)
Patient had mild mitral regurgitation in the past.  It has been 3 years since her last echo.  I will arrange for a followup echo to be sure that there is been no significant progression.  If not there will not be a need for another echo for at least 5 years.  In the past the patient had taken antibiotics for dental prophylaxis.  There is no longer a guideline recommendations for antibiotic use in her case.  She and her husband feel more comfortable using an antibiotic.  She tolerates it well and she has not had any reactions.  In my opinion it is totally acceptable for her to do this if she chooses.  Patient asked me about the use of calcium for protection from osteoporosis.  I made it clear to her that there is no formal recommendation concerning the use of calcium from the cardiovascular viewpoint.  There are theoretical issues but none of these have been proven.  The patient has significant concerns about her osteoporosis risk.  I feel it is up to her and her other physicians to decide this issue.  There is no contraindication from my viewpoint.

## 2011-08-17 NOTE — Assessment & Plan Note (Signed)
Flecainide Will be continued.  No change in therapy.

## 2011-08-17 NOTE — Progress Notes (Signed)
HPI    Patient is seen in followup for history of paroxysmal atrial fibrillation.  Historically she had very rare episodes of atrial fib in the past.  We decided to put her on Flecainide 50 mg b.i.d. Several years ago.  She had a level done in June, 2011.  Level was in the low therapeutic range of 0.28.  Clinically she's had no recurrence of atrial fib.  She feels quite good and she is quite active.  She's not had chest pain or shortness of breath.  Allergies  Allergen Reactions  . Tetanus Toxoid     Current Outpatient Prescriptions  Medication Sig Dispense Refill  . aspirin 81 MG tablet Take 325 mg by mouth daily.       . calcium-vitamin D 250-100 MG-UNIT per tablet Take by mouth daily.       . Cholecalciferol (VITAMIN D PO) Take by mouth. 50000 weekly       . diltiazem (CARDIZEM CD) 120 MG 24 hr capsule TAKE 1 CAPSULE BY MOUTH ONCE A DAY  90 capsule  4  . fish oil-omega-3 fatty acids 1000 MG capsule Take 2 g by mouth daily.        . flecainide (TAMBOCOR) 50 MG tablet TAKE ONE TABLET BY MOUTH EVERY 12 HOURS  180 tablet  4  . Multiple Vitamin (MULTIVITAMIN) capsule Take 1 capsule by mouth daily.        . simvastatin (ZOCOR) 40 MG tablet Take 40 mg by mouth at bedtime.          History   Social History  . Marital Status: Married    Spouse Name: N/A    Number of Children: N/A  . Years of Education: N/A   Occupational History  . Not on file.   Social History Main Topics  . Smoking status: Never Smoker   . Smokeless tobacco: Not on file  . Alcohol Use: Yes  . Drug Use: No  . Sexually Active:    Other Topics Concern  . Not on file   Social History Narrative  . No narrative on file    No family history on file.  Past Medical History  Diagnosis Date  . Hyperlipidemia   . Drug therapy     Flecainide therapy started March, 2011, treadmill March, 2011, no exercise-induced ectopy  . Atrial fibrillation     Very rare episodes  / episode March, 2011  CHADS score O  . Ejection  fraction     EF 55-65%, echo, October, 2009  . Mitral regurgitation     Mild, echo, October, 2009    No past surgical history on file.  ROS   Patient denies fever, chills, headache, sweats, rash, change in vision, change in hearing, chest pain, cough, nausea vomiting, urinary symptoms.  All other systems are reviewed and are negative.  PHYSICAL EXAM  Patient appears quite healthy.  She is oriented to person time and place.  Affect is normal.  There is no jugular venous distention.  Lungs are clear.  Respiratory effort is nonlabored.  Cardiac exam reveals an S1 and S2.  There is no significant murmur heard.  The abdomen is soft.  There is no peripheral edema.  Filed Vitals:   08/17/11 0936  BP: 118/78  Pulse: 76  Height: 5\' 6"  (1.676 m)  Weight: 145 lb (65.772 kg)    EKG EKG is done today and reviewed by me.  She has normal sinus rhythm.  There is no significant abnormality  ASSESSMENT &  PLAN

## 2011-08-17 NOTE — Patient Instructions (Signed)
Your physician recommends that you schedule a follow-up appointment in: 12 months with Dr Myrtis Ser Your physician has requested that you have an echocardiogram. Echocardiography is a painless test that uses sound waves to create images of your heart. It provides your doctor with information about the size and shape of your heart and how well your heart's chambers and valves are working. This procedure takes approximately one hour. There are no restrictions for this procedure.

## 2011-08-23 ENCOUNTER — Encounter: Payer: Self-pay | Admitting: Vascular Surgery

## 2011-08-28 ENCOUNTER — Encounter: Payer: Self-pay | Admitting: Vascular Surgery

## 2011-08-28 ENCOUNTER — Ambulatory Visit (INDEPENDENT_AMBULATORY_CARE_PROVIDER_SITE_OTHER): Payer: Medicare Other | Admitting: Vascular Surgery

## 2011-08-28 ENCOUNTER — Ambulatory Visit (HOSPITAL_COMMUNITY): Payer: Medicare Other | Attending: Cardiology | Admitting: Radiology

## 2011-08-28 VITALS — BP 131/71 | HR 85 | Resp 18 | Ht 66.0 in | Wt 142.0 lb

## 2011-08-28 DIAGNOSIS — I079 Rheumatic tricuspid valve disease, unspecified: Secondary | ICD-10-CM | POA: Insufficient documentation

## 2011-08-28 DIAGNOSIS — I059 Rheumatic mitral valve disease, unspecified: Secondary | ICD-10-CM

## 2011-08-28 DIAGNOSIS — I83893 Varicose veins of bilateral lower extremities with other complications: Secondary | ICD-10-CM

## 2011-08-28 DIAGNOSIS — I4891 Unspecified atrial fibrillation: Secondary | ICD-10-CM

## 2011-08-28 DIAGNOSIS — I340 Nonrheumatic mitral (valve) insufficiency: Secondary | ICD-10-CM

## 2011-08-28 DIAGNOSIS — E785 Hyperlipidemia, unspecified: Secondary | ICD-10-CM | POA: Insufficient documentation

## 2011-08-28 NOTE — Progress Notes (Signed)
BLE venous DUplex for V.V. On 08/28/2011 performed VVS

## 2011-08-28 NOTE — Progress Notes (Signed)
Subjective:     Patient ID: [name], female   DOB: 12-Sep-1941, 69 y.o.   MRN: [id number]  HPI this 70 year old healthy female is referred for venous insufficiency of the left leg. She has a history of sclerotherapy on multiple occasions performed by Dr. Amy Swaziland and other physicians in the past over 20-30 year span. She has developed increasingly prominent spider and varicose veins in the left lateral calf area which cause aching throbbing and burning discomfort as the day progresses. She also has developed worsening spider and reticular veins in the left thigh area. She has no history of DVT or thrombophlebitis. He has worn elastic compression stockings in the past without improvement.. She has never had previous laser treatment or surgical treatment for her varicosities. She continues to have worsening of her symptoms which are affecting her daily living with pain in the left leg. Past Medical History  Diagnosis Date  . Hyperlipidemia   . Drug therapy     Flecainide therapy started March, 2011, treadmill March, 2011, no exercise-induced ectopy  . Atrial fibrillation     Very rare episodes  / episode March, 2011  CHADS score O  . Ejection fraction     EF 55-65%, echo, October, 2009  . Mitral regurgitation     Mild, echo, October, 2009    History  Substance Use Topics  . Smoking status: Never Smoker   . Smokeless tobacco: Never Used  . Alcohol Use: 0.0 oz/week    Family History  Problem Relation Age of Onset  . Parkinsonism Father     Allergies  Allergen Reactions  . Tetanus Toxoid     Current outpatient prescriptions:aspirin 81 MG tablet, Take 325 mg by mouth daily. TAKING 81 MG DAILY, Disp: , Rfl: ;  calcium-vitamin D 250-100 MG-UNIT per tablet, Take by mouth daily. , Disp: , Rfl: ;  Cholecalciferol (VITAMIN D PO), Take by mouth. 50000 weekly , Disp: , Rfl: ;  diltiazem (CARDIZEM CD) 120 MG 24 hr capsule, TAKE 1 CAPSULE BY MOUTH ONCE A DAY, Disp: 90 capsule, Rfl:  4 fish oil-omega-3 fatty acids 1000 MG capsule, Take 2 g by mouth daily.  , Disp: , Rfl: ;  flecainide (TAMBOCOR) 50 MG tablet, TAKE ONE TABLET BY MOUTH EVERY 12 HOURS, Disp: 180 tablet, Rfl: 4;  Multiple Vitamin (MULTIVITAMIN) capsule, Take 1 capsule by mouth daily.  , Disp: , Rfl: ;  simvastatin (ZOCOR) 40 MG tablet, Take 40 mg by mouth at bedtime.  , Disp: , Rfl:   BP 131/71  Pulse 85  Resp 18  Ht 5\' 6"  (1.676 m)  Wt 142 lb (64.411 kg)  BMI 22.92 kg/m2  Body mass index is 22.92 kg/(m^2).         Review of Systems she denies chest pain, dyspnea on exertion, PND, orthopnea, hemoptysis, lower extremity claudication. She has a history of palpitations and atrial fibrillation but not in the past few years. All other symptoms are negative and a complete review of systems     Objective:   Physical Exam blood pressure 131/71 heart rate 85 respirations 18 General she is well-developed well-nourished female no apparent stress alert and oriented x3 HEENT normal for age Lungs no rhonchi or wheezing Cardiovascular regular and no murmurs carotid pulses 3+ no audible bruits Abdomen soft nontender with no palpable masses Neurologic normal Skin free of rashes Lower extremity exam reveals 3+ femoral and dorsalis pedis pulses palpable bilaterally. No distal edema is noted. She has  bulging varicosities  in the left lateral calf. There are diffuse bilateral veins almost circumferential in nature in the anterior and posterior thigh on the left. She has some prominent veins in the left pretibial area. There are no medial varicosities noted. There is no hyperpigmentation or ulceration noted.  Today I ordered a venous duplex exam of the left leg which are reviewed and interpreted. She has mild reflux in the left great saphenous system through the anterior accessory branch but no reflux in the left great saphenous vein trunk. Left small saphenous vein is a large vein which has gross reflux throughout  supplying these varicosities in the left lateral calf area. The right leg also has reflux in the small saphenous vein which is a smaller vessel and in the great saphenous vein intermittently. There is no DVT bilaterally.    Assessment:    venous insufficiency left leg secondary to gross reflux left small saphenous vein supplying bulging varicosities left lateral calf. These are affecting her daily living causing significant pain    Plan:    #1 long-leg elastic compression stockings 20-30 mm gradient #2 elevate legs intermittently during the day #3 ibuprofen daily to see if this effects her symptomatology #4 patient return in 3 months. There is no improvement in her symptomatology believe she should be treated with laser ablation left small saphenous vein with 10-20 stab phlebectomy and one course of sclerotherapy

## 2011-08-28 NOTE — Progress Notes (Deleted)
Laser Ablation Procedure      Date: 08/28/2011    [name] DOB:Apr 25, 1941  Consent signed: Yes  Surgeon:J.D. Hart Rochester  Procedure: Laser Ablation: right Greater Saphenous Vein  BP 131/71  Pulse 85  Resp 18  Ht 5\' 6"  (1.676 m)  Wt 142 lb (64.411 kg)  BMI 22.92 kg/m2  Start time: 9:15AM   End time: 10:45AM  Tumescent Anesthesia: 475 cc 0.9% NaCl with 50 cc Lidocaine HCL with 1% Epi and 15 cc 8.4% NaHCO3  Local Anesthesia: 16 cc Lidocaine HCL and NaHCO3 (ratio 2:1)  Pulsed Mode 15 watts  1 second   1 pulse  1    Total Pulses  124   Total Energy  1821 Joules   Total Time 2 :01   Stab Phlebectomy: >29 Sites: Thigh. calf  Patient tolerated procedure well: yes  [name], Neena Rhymes Description of Procedure:  After marking the course of the saphenous vein and the secondary varicosities in the standing position, the patient was placed on the operating table in the supine position, and the right leg was prepped and draped in sterile fashion. Local anesthetic was administered, and under ultrasound guidance the saphenous vein was accessed with a micro needle and guide wire; then the micro puncture sheath was placed. A guide wire was inserted to the saphenofemoral junction, followed by a 5 french sheath.  The position of the sheath and then the laser fiber below the junction was confirmed using the ultrasound and visualization of the aiming beam.  Tumescent anesthesia was administered along the course of the saphenous vein using ultrasound guidance. Protective laser glasses were placed on the patient, and the laser was fired at 15 watt pulsed mode advancing 1-2 mm per sec.  For a total of 1821 joules.  A steri strip was applied to the puncture site.  The patient was then put into Trendelenburg position.  Local anesthetic was utilized overlying the marked varicosities.  Greater than >20 stab wounds were made using the tip of an 11 blade; and using the vein hook,  The phlebectomies were  performed using a hemostat to avulse these varicosities.  Adequate hemostasis was achieved, and steri strips were applied to the stab wound.    ABD pads and thigh high compression stockings were applied.  Ace wrap bandages were applied over the phlebectomy sites and at the top of the saphenofemoral junction.  Blood loss was less than 15 cc.  The patient ambulated out of the operating room having tolerated the procedure well.

## 2011-08-30 NOTE — Procedures (Unsigned)
LOWER EXTREMITY VENOUS REFLUX EXAM  INDICATION:  Varicose veins  EXAM:  Using color-flow imaging and pulse Doppler spectral analysis, the bilateral common femoral, femoral, popliteal, posterior tibial, great and small saphenous veins were evaluated.  There is evidence suggesting deep venous insufficiency in the right lower extremity.  There is no evidence suggesting deep venous insufficiency in the left lower extremity.  The bilateral saphenofemoral junctions are competent.  The right GSV is not competent with reflux of >500 milliseconds with the caliber as described below.  The left GSV is competent.  The left anterolateral branch is not competent with reflux of >500 milliseconds with diameter ranges from 0.45 cm to 0.25 cm.  The bilateral proximal small saphenous veins demonstrate incompetency. The right small saphenous vein diameters range from 0.70 cm to 0.27 cm. The left small saphenous vein diameters range from 0.59 cm to 0.32 cm.  GSV Diameter (used if found to be incompetent only)                                           Right    Left Proximal Greater Saphenous Vein           0.67 cm  cm Proximal-to-mid-thigh                     0.49 cm  cm Mid thigh                                 0.56 cm  cm Mid-distal thigh                          cm       cm Distal thigh                              0.42 cm  cm Knee                                      0.46 cm  cm  IMPRESSION: 1. The right great saphenous vein is not competent with reflux of >500     milliseconds. 2. The left great saphenous vein is competent. 3. The left anterolateral branch is not competent with diameters as     noted above. 4. The bilateral great saphenous veins are not tortuous. 5. The deep venous system of the right lower extremity is not     competent with reflux of greater than 500 milliseconds. 6. The deep venous system of the left lower extremity is competent. 7. The bilateral small  saphenous veins are not competent with reflux     of >500 milliseconds.  ___________________________________________ Quita Skye Hart [place], M.D.  SH/MEDQ  D:  08/28/2011  T:  08/28/2011  Job:  [id number]

## 2011-08-31 NOTE — Progress Notes (Signed)
Pt was notified.  

## 2011-12-01 ENCOUNTER — Encounter: Payer: Self-pay | Admitting: Vascular Surgery

## 2011-12-04 ENCOUNTER — Encounter: Payer: Self-pay | Admitting: Vascular Surgery

## 2011-12-04 ENCOUNTER — Ambulatory Visit (INDEPENDENT_AMBULATORY_CARE_PROVIDER_SITE_OTHER): Payer: Medicare Other | Admitting: Vascular Surgery

## 2011-12-04 VITALS — BP 119/83 | HR 79 | Resp 18 | Ht 66.5 in | Wt 140.0 lb

## 2011-12-04 DIAGNOSIS — I83893 Varicose veins of bilateral lower extremities with other complications: Secondary | ICD-10-CM | POA: Insufficient documentation

## 2011-12-04 NOTE — Progress Notes (Signed)
Subjective:     Patient ID: [name], female   DOB: 1941/03/26, 71 y.o.   MRN: [id number]  HPI 71 year old female returns for further evaluation of her venous insufficiency of the left lower leg. She has bulging varicosities associated with reflux in the left small saphenous vein. She has been treating this with long-leg elastic compression stockings 20-30 mm gradient as well as frequent elevation of the legs. She is unable to take ibuprofen. He has had no improvement in her symptomatology which consists of aching throbbing and burning discomfort which worsens as the day progresses.  Past Medical History  Diagnosis Date  . Hyperlipidemia   . Drug therapy     Flecainide therapy started March, 2011, treadmill March, 2011, no exercise-induced ectopy  . Atrial fibrillation     Very rare episodes  / episode March, 2011  CHADS score O  . Ejection fraction     EF 55-65%, echo, October, 2009  . Mitral regurgitation     Mild, echo, October, 2009    History  Substance Use Topics  . Smoking status: Never Smoker   . Smokeless tobacco: Never Used  . Alcohol Use: 0.0 oz/week    Family History  Problem Relation Age of Onset  . Parkinsonism Father     Allergies  Allergen Reactions  . Tetanus Toxoid     Current outpatient prescriptions:aspirin 81 MG tablet, Take 325 mg by mouth daily. TAKING 81 MG DAILY, Disp: , Rfl: ;  calcium-vitamin D 250-100 MG-UNIT per tablet, Take by mouth daily. , Disp: , Rfl: ;  Cholecalciferol (VITAMIN D PO), Take by mouth. 50000 weekly , Disp: , Rfl: ;  diltiazem (CARDIZEM CD) 120 MG 24 hr capsule, TAKE 1 CAPSULE BY MOUTH ONCE A DAY, Disp: 90 capsule, Rfl: 4 fish oil-omega-3 fatty acids 1000 MG capsule, Take 2 g by mouth daily.  , Disp: , Rfl: ;  flecainide (TAMBOCOR) 50 MG tablet, TAKE ONE TABLET BY MOUTH EVERY 12 HOURS, Disp: 180 tablet, Rfl: 4;  Multiple Vitamin (MULTIVITAMIN) capsule, Take 1 capsule by mouth daily.  , Disp: , Rfl: ;  simvastatin (ZOCOR) 40 MG  tablet, Take 40 mg by mouth at bedtime.  , Disp: , Rfl:   BP 119/83  Pulse 79  Resp 18  Ht 5' 6.5" (1.689 m)  Wt 140 lb (63.504 kg)  BMI 22.26 kg/m2  Body mass index is 22.26 kg/(m^2).         Review of Systems     Objective:   Physical Exam blood pressure 119/83 heart rate 79 respirations 18 General well-developed well-nourished female in no apparent distress alert and oriented x3 Lawrence remedy exam on the left reveals 3+ femoral popliteal dorsalis pedis pulses palpable. She has bulging varicosities extending laterally into the small saphenous system and radiating around to the pretibial region. She has diffuse spider veins in her anterior and lateral thigh on the left as well as distally.  She has documented gross reflux in the left small saphenous vein supplying these varicosities.  She also has reflux in the right great saphenous vein but is not having enough symptomatology on the right to require treatment     Assessment:     Gross reflux left small saphenous vein with painful varicosities    Plan:     Patient needs #1 laser ablation left small saphenous vein #2 return in 3 months with possible stab phlebectomy secondary varicosities left leg with one course sclerotherapy

## 2011-12-18 ENCOUNTER — Encounter: Payer: Self-pay | Admitting: *Deleted

## 2011-12-19 ENCOUNTER — Other Ambulatory Visit: Payer: Self-pay | Admitting: *Deleted

## 2011-12-19 DIAGNOSIS — I83893 Varicose veins of bilateral lower extremities with other complications: Secondary | ICD-10-CM

## 2012-01-26 ENCOUNTER — Encounter: Payer: Self-pay | Admitting: Vascular Surgery

## 2012-01-29 ENCOUNTER — Ambulatory Visit (INDEPENDENT_AMBULATORY_CARE_PROVIDER_SITE_OTHER): Payer: Medicare Other | Admitting: Vascular Surgery

## 2012-01-29 ENCOUNTER — Encounter: Payer: Self-pay | Admitting: Vascular Surgery

## 2012-01-29 VITALS — BP 142/85 | HR 82 | Resp 18 | Ht 66.0 in | Wt 140.0 lb

## 2012-01-29 DIAGNOSIS — I83893 Varicose veins of bilateral lower extremities with other complications: Secondary | ICD-10-CM

## 2012-01-29 NOTE — Progress Notes (Signed)
Subjective:     Patient ID: [name], female   DOB: 10/24/1940, 71 y.o.   MRN: [id number]  HPI this 71 year old female had laser ablation of the left small saphenous vein performed under local tumescent anesthesia for painful varicosities and venous hypertension in the left leg. She tolerated the procedure well.  Review of Systems     Objective:   Physical ExamBP 142/85  Pulse 82  Resp 18  Ht 5\' 6"  (1.676 m)  Wt 140 lb (63.504 kg)  BMI 22.60 kg/m2      Assessment:    well-tolerated laser ablation left small saphenous vein performed under local tumescent anesthesia for venous hypertension with painful varicosities    Plan:     Return in one week for venous duplex exam left leg to confirm closure left small saphenous vein Patient will then return in 3 months to see if stab phlebectomy and sclerotherapy is necessary

## 2012-01-29 NOTE — Progress Notes (Signed)
Laser Ablation Procedure      Date: 01/29/2012    [name] DOB:[birth year]  Consent signed: Yes  Surgeon:J.D. Hart Rochester  Procedure: Laser Ablation: left Small Saphenous Vein  BP 142/85  Pulse 82  Resp 18  Ht 5\' 6"  (1.676 m)  Wt 140 lb (63.504 kg)  BMI 22.60 kg/m2  Start time: 3:00   End time: 3:30  Tumescent Anesthesia: 225 cc 0.9% NaCl with 50 cc Lidocaine HCL with 1% Epi and 15 cc 8.4% NaHCO3  Local Anesthesia: 7 cc Lidocaine HCL and NaHCO3 (ratio 2:1)  Pulsed mode: Watts 15 Seconds 1 Pulses:1 Total Pulses:91 Total Energy: 1365 Total Time: 1:31   Patient tolerated procedure well: Yes   Description of Procedure:  After marking the course of the saphenous vein and the secondary varicosities in the standing position, the patient was placed on the operating table in the prone position, and the left leg was prepped and draped in sterile fashion. Local anesthetic was administered, and under ultrasound guidance the saphenous vein was accessed with a micro needle and guide wire; then the micro puncture sheath was placed. A guide wire was inserted to the saphenopopliteal junction, followed by a 5 french sheath.  The position of the sheath and then the laser fiber below the junction was confirmed using the ultrasound and visualization of the aiming beam.  Tumescent anesthesia was administered along the course of the saphenous vein using ultrasound guidance. Protective laser glasses were placed on the patient, and the laser was fired at 15 watt pulsed mode advancing 1-2 mm per sec.  For a total of 1365 joules.  A steri strip was applied to the puncture site.     ABD pads and thigh high compression stockings were applied.  Ace wrap bandages were applied over the phlebectomy sites and at the top of the saphenopopliteal junction.  Blood loss was less than 15 cc.  The patient ambulated out of the operating room having tolerated the procedure well.

## 2012-01-30 ENCOUNTER — Ambulatory Visit: Payer: Medicare Other | Admitting: Vascular Surgery

## 2012-01-30 ENCOUNTER — Encounter: Payer: Self-pay | Admitting: Vascular Surgery

## 2012-01-30 ENCOUNTER — Telehealth: Payer: Self-pay | Admitting: *Deleted

## 2012-01-30 NOTE — Telephone Encounter (Signed)
Reached patient at home. Having no pain. Following all instructions. Reminded her of her fu appts. Next wk.

## 2012-02-05 ENCOUNTER — Encounter: Payer: Self-pay | Admitting: Vascular Surgery

## 2012-02-06 ENCOUNTER — Encounter: Payer: Self-pay | Admitting: Vascular Surgery

## 2012-02-06 ENCOUNTER — Encounter (INDEPENDENT_AMBULATORY_CARE_PROVIDER_SITE_OTHER): Payer: Medicare Other | Admitting: *Deleted

## 2012-02-06 ENCOUNTER — Ambulatory Visit (INDEPENDENT_AMBULATORY_CARE_PROVIDER_SITE_OTHER): Payer: Medicare Other | Admitting: Vascular Surgery

## 2012-02-06 VITALS — BP 114/58 | HR 74 | Resp 16 | Ht 66.0 in | Wt 142.0 lb

## 2012-02-06 DIAGNOSIS — I83893 Varicose veins of bilateral lower extremities with other complications: Secondary | ICD-10-CM

## 2012-02-06 NOTE — Progress Notes (Signed)
Subjective:     Patient ID: [name], female   DOB: 1940-10-23, 71 y.o.   MRN: [id number]  HPI this 71 year old healthy female returns 1 week post laser ablation left small saphenous vein for painful varicosities. She has had mild to moderate discomfort with no distal edema and feels the leg is less tight than previously. She is wearing elastic compression stockings and taking ibuprofen as prescribed. She has no symptoms in the contralateral right leg. She denies any chest pain, dyspnea on exertion, PND, orthopnea, hemoptysis, or other pulmonary symptoms.  Past Medical History  Diagnosis Date  . Hyperlipidemia   . Drug therapy     Flecainide therapy started March, 2011, treadmill March, 2011, no exercise-induced ectopy  . Atrial fibrillation     Very rare episodes  / episode March, 2011  CHADS score O  . Ejection fraction     EF 55-65%, echo, October, 2009  . Mitral regurgitation     Mild, echo, October, 2009    History  Substance Use Topics  . Smoking status: Never Smoker   . Smokeless tobacco: Never Used  . Alcohol Use: 0.0 oz/week    Family History  Problem Relation Age of Onset  . Parkinsonism Father     Allergies  Allergen Reactions  . Tetanus Toxoid     Current outpatient prescriptions:aspirin 81 MG tablet, Take 325 mg by mouth daily. TAKING 81 MG DAILY, Disp: , Rfl: ;  calcium-vitamin D 250-100 MG-UNIT per tablet, Take by mouth daily. , Disp: , Rfl: ;  Cholecalciferol (VITAMIN D PO), Take by mouth. 50000 weekly , Disp: , Rfl: ;  diltiazem (CARDIZEM CD) 120 MG 24 hr capsule, TAKE 1 CAPSULE BY MOUTH ONCE A DAY, Disp: 90 capsule, Rfl: 4 fish oil-omega-3 fatty acids 1000 MG capsule, Take 2 g by mouth daily.  , Disp: , Rfl: ;  flecainide (TAMBOCOR) 50 MG tablet, TAKE ONE TABLET BY MOUTH EVERY 12 HOURS, Disp: 180 tablet, Rfl: 4;  Multiple Vitamin (MULTIVITAMIN) capsule, Take 1 capsule by mouth daily.  , Disp: , Rfl: ;  simvastatin (ZOCOR) 40 MG tablet, Take 40 mg by mouth  at bedtime.  , Disp: , Rfl:   BP 114/58  Pulse 74  Resp 16  Ht 5\' 6"  (1.676 m)  Wt 142 lb (64.411 kg)  BMI 22.92 kg/m2  Body mass index is 22.92 kg/(m^2).          Review of Systems     Objective:   Physical Exam blood pressure 114/58 heart rate 74 respirations 16 General well-developed well-nourished female in no apparent distress alert and oriented x3 Lungs no rhonchi or wheezing Left lower extremity with 3+ femoral and dorsalis pedis pulse palpable. There is mild tenderness along the course of the left small saphenous vein. There continued to be some bulging varicosities along the lateral aspect of the left lower leg but not as tense as previously.  Today I ordered a venous duplex exam of the left leg which are reviewed and interpreted. There is no DVT. The left small saphenous vein is totally occluded from the mid calf to the distal thigh    Assessment:     Successful laser ablation left small saphenous vein for painful varicosities left leg    Plan:     Return in 3 months to see if stab phlebectomy and or sclerotherapy will be indicated

## 2012-02-12 NOTE — Procedures (Unsigned)
DUPLEX DEEP VENOUS EXAM - LOWER EXTREMITY  INDICATION:  Varicose veins with other complications.  HISTORY:  Edema:  No. Trauma/Surgery:  Left lesser saphenous vein laser ablation on 01/29/2012. Pain:  Mild left calf tenderness. PE:  No. Previous DVT:  No. Anticoagulants: Other:  DUPLEX EXAM:               CFV   SFV   PopV  PTV    GSV               R  L  R  L  R  L  R   L  R  L Thrombosis    o  o     o     o      o     o Spontaneous   +  +     +     +      +     + Phasic        +  +     +     +      +     + Augmentation  +  +     +     +      +     + Compressible  +  +     +     +      +     + Competent     0  0     +     0      +     +  Legend:  + - yes  o - no  p - partial  D - decreased  IMPRESSION: 1. No evidence of deep vein thrombosis noted in the left lower     extremity. 2. The left lesser saphenous vein appears totally occluded from the     mid calf to distal thigh levels. 3. Reflux of >514milliseconds noted in the left popliteal and     bilateral common femoral veins.   _____________________________ Quita Skye Hart Rochester, M.D.  CH/MEDQ  D:  02/09/2012  T:  02/09/2012  Job:  [id number]

## 2012-05-05 ENCOUNTER — Other Ambulatory Visit: Payer: Self-pay | Admitting: Cardiology

## 2012-05-06 NOTE — Telephone Encounter (Signed)
Pt needs appointment then refill can be made Fax Received. Refill Completed. Athziry Millican Chowoe (R.M.A)   

## 2012-05-09 NOTE — Telephone Encounter (Signed)
Talk to me about this

## 2012-05-14 ENCOUNTER — Encounter: Payer: Self-pay | Admitting: Gastroenterology

## 2012-05-27 ENCOUNTER — Encounter: Payer: Self-pay | Admitting: Vascular Surgery

## 2012-05-28 ENCOUNTER — Encounter: Payer: Self-pay | Admitting: Vascular Surgery

## 2012-05-28 ENCOUNTER — Ambulatory Visit (INDEPENDENT_AMBULATORY_CARE_PROVIDER_SITE_OTHER): Payer: Medicare Other | Admitting: Vascular Surgery

## 2012-05-28 VITALS — BP 136/66 | HR 76 | Resp 16 | Ht 66.0 in | Wt 140.0 lb

## 2012-05-28 DIAGNOSIS — I83893 Varicose veins of bilateral lower extremities with other complications: Secondary | ICD-10-CM

## 2012-05-28 NOTE — Progress Notes (Signed)
Subjective:     Patient ID: [name], female   DOB: [birth year], 71 y.o.   MRN: [id number]  HPI this 71 year old female returns for further followup regarding her recent laser ablation of the left small saphenous vein for painful varicosities in the left lower leg. The leg is feeling better than he did prior to the procedure but she continues to have some aching discomfort particularly in the distal thigh and lateral and posterior calf. She has had no swelling. She has no symptoms in the right leg. She has worn elastic compression stocking as instructed. She has no history of stasis ulcers bleeding DVT or thrombophlebitis.  Past Medical History  Diagnosis Date  . Hyperlipidemia   . Drug therapy     Flecainide therapy started March, 2011, treadmill March, 2011, no exercise-induced ectopy  . Atrial fibrillation     Very rare episodes  / episode March, 2011  CHADS score O  . Ejection fraction     EF 55-65%, echo, October, 2009  . Mitral regurgitation     Mild, echo, October, 2009    History  Substance Use Topics  . Smoking status: Never Smoker   . Smokeless tobacco: Never Used  . Alcohol Use: 0.0 oz/week    Family History  Problem Relation Age of Onset  . Parkinsonism Father     Allergies  Allergen Reactions  . Tetanus Toxoid     Current outpatient prescriptions:aspirin 81 MG tablet, Take 325 mg by mouth daily. TAKING 81 MG DAILY, Disp: , Rfl: ;  calcium-vitamin D 250-100 MG-UNIT per tablet, Take by mouth daily. , Disp: , Rfl: ;  Cholecalciferol (VITAMIN D PO), Take by mouth. 50000 weekly , Disp: , Rfl: ;  diltiazem (CARDIZEM CD) 120 MG 24 hr capsule, TAKE 1 CAPSULE BY MOUTH ONCE A DAY, Disp: 90 capsule, Rfl: 4 fish oil-omega-3 fatty acids 1000 MG capsule, Take 2 g by mouth daily.  , Disp: , Rfl: ;  flecainide (TAMBOCOR) 50 MG tablet, TAKE ONE TABLET BY MOUTH EVERY 12 HOURS, Disp: 180 tablet, Rfl: 4;  Multiple Vitamin (MULTIVITAMIN) capsule, Take 1 capsule by mouth daily.  ,  Disp: , Rfl: ;  simvastatin (ZOCOR) 40 MG tablet, Take 40 mg by mouth at bedtime.  , Disp: , Rfl:   BP 136/66  Pulse 76  Resp 16  Ht 5\' 6"  (1.676 m)  Wt 140 lb (63.504 kg)  BMI 22.60 kg/m2  Body mass index is 22.60 kg/(m^2).           Review of Systems denies chest pain, dyspnea on exertion, PND, orthopnea, hemoptysis, claudication    Objective:   Physical Exam blood pressure 136/66 heart rate 76 respirations 16 General well-developed well-nourished female in no apparent distress alert and oriented x3 Left lower extremity with 3+ femoral and dorsalis pedis pulse palpable. There are residual bulging varicosities on the lateral aspect of the left calf with multiple reticular and spider veins in the distal thigh and calf area. 1+ edema present distally. No hyperpigmentation or ulceration is noted. Right leg is free of significant varicosities.    Assessment:     Persistent symptoms and residual varicosities and reticular veins left leg following laser ablation left small saphenous vein    Plan:      patient needs 2 courses of sclerotherapy to complete treatment regimen left leg of venous insufficiency

## 2012-06-25 ENCOUNTER — Encounter: Payer: Self-pay | Admitting: *Deleted

## 2012-06-26 ENCOUNTER — Ambulatory Visit (INDEPENDENT_AMBULATORY_CARE_PROVIDER_SITE_OTHER): Payer: Medicare Other | Admitting: *Deleted

## 2012-06-26 DIAGNOSIS — I83893 Varicose veins of bilateral lower extremities with other complications: Secondary | ICD-10-CM

## 2012-06-26 NOTE — Progress Notes (Signed)
X=.3% Sotradecol administered with a 27g butterfly.  Patient received a total of 12cc foam.  Treated major spider veins and three reticular veins. Easy access. (Skin is thin.) Anticipate good results. Patient tolerated tx very well. One more ins covered sclero treatment to be done 07/31/12.  Photos: yes  Compression stockings applied: yes

## 2012-07-08 ENCOUNTER — Encounter: Payer: Self-pay | Admitting: Gastroenterology

## 2012-07-11 ENCOUNTER — Other Ambulatory Visit: Payer: Self-pay | Admitting: Obstetrics & Gynecology

## 2012-07-11 DIAGNOSIS — Z1231 Encounter for screening mammogram for malignant neoplasm of breast: Secondary | ICD-10-CM

## 2012-07-19 ENCOUNTER — Ambulatory Visit (AMBULATORY_SURGERY_CENTER): Payer: Medicare Other | Admitting: *Deleted

## 2012-07-19 VITALS — Ht 66.5 in | Wt 143.0 lb

## 2012-07-19 DIAGNOSIS — Z1211 Encounter for screening for malignant neoplasm of colon: Secondary | ICD-10-CM

## 2012-07-19 MED ORDER — PEG-KCL-NACL-NASULF-NA ASC-C 100 G PO SOLR: ORAL | Status: DC

## 2012-07-19 NOTE — Progress Notes (Signed)
No allergies to eggs or soy products 

## 2012-07-30 ENCOUNTER — Encounter: Payer: Self-pay | Admitting: Cardiology

## 2012-07-31 ENCOUNTER — Ambulatory Visit: Payer: Medicare Other | Admitting: *Deleted

## 2012-08-05 ENCOUNTER — Encounter: Payer: Self-pay | Admitting: Gastroenterology

## 2012-08-05 ENCOUNTER — Ambulatory Visit (AMBULATORY_SURGERY_CENTER): Payer: Medicare Other | Admitting: Gastroenterology

## 2012-08-05 VITALS — BP 139/77 | HR 71 | Temp 97.3°F | Resp 18 | Ht 66.5 in | Wt 143.0 lb

## 2012-08-05 DIAGNOSIS — K5520 Angiodysplasia of colon without hemorrhage: Secondary | ICD-10-CM

## 2012-08-05 DIAGNOSIS — Z1211 Encounter for screening for malignant neoplasm of colon: Secondary | ICD-10-CM

## 2012-08-05 MED ORDER — SODIUM CHLORIDE 0.9 % IV SOLN: 500.0000 mL | INTRAVENOUS | Status: DC

## 2012-08-05 NOTE — Op Note (Signed)
[facility] [address] [place], [address]   COLONOSCOPY PROCEDURE REPORT  PATIENT: [name], [name]  MR#: [id number] BIRTHDATE: 17-Dec-1940 , 71  yrs. old GENDER: Female ENDOSCOPIST: Mardella Layman, MD, Clementeen Graham REFERRED BY:  Guerry Bruin, M.D. PROCEDURE DATE:  08/05/2012 PROCEDURE:   Colonoscopy, screening ASA CLASS:   Class II INDICATIONS:average risk patient for colon cancer. MEDICATIONS: propofol (Diprivan) 250mg  IV  DESCRIPTION OF PROCEDURE:   After the risks and benefits and of the procedure were explained, informed consent was obtained.  A digital rectal exam revealed no abnormalities of the rectum.    The LB [id number]  endoscope was introduced through the anus and advanced to the cecum, which was identified by both the appendix and ileocecal valve .  The quality of the prep was excellent, using MoviPrep .  The instrument was then slowly withdrawn as the colon was fully examined.     COLON FINDINGS: A normal appearing cecum, ileocecal valve, and appendiceal orifice were identified.  The ascending, hepatic flexure, transverse, splenic flexure, descending, sigmoid colon and rectum appeared unremarkable.  No polyps or cancers were seen. Small nonbleeding cecal telangiectasiae...see pictures. Retroflexed views revealed no abnormalities.     The scope was then withdrawn from the patient and the procedure completed.  COMPLICATIONS: There were no complications. ENDOSCOPIC IMPRESSION: 1.   Normal colon...no polyps or cancer 2.   Angiodysplastic lesion 3.   Small nonbleeding cecal telangiectasiae...see pictures.  RECOMMENDATIONS: 1.  Continue current medications 2.  Continue current colorectal screening recommendations for "routine risk" patients with a repeat colonoscopy in 10 years.   REPEAT EXAM:  cc:  _______________________________ eSignedMardella Layman, MD, [facility] 08/05/2012 2:57 PM

## 2012-08-05 NOTE — Progress Notes (Signed)
Patient did not experience any of the following events: a burn prior to discharge; a fall within the facility; wrong site/side/patient/procedure/implant event; or a hospital transfer or hospital admission upon discharge from the facility. (G8907) Patient did not have preoperative order for IV antibiotic SSI prophylaxis. (G8918)  

## 2012-08-05 NOTE — Patient Instructions (Addendum)
YOU HAD AN ENDOSCOPIC PROCEDURE TODAY AT THE [facility]: Refer to the procedure report that was given to you for any specific questions about what was found during the examination.  If the procedure report does not answer your questions, please call your gastroenterologist to clarify.  If you requested that your care partner not be given the details of your procedure findings, then the procedure report has been included in a sealed envelope for you to review at your convenience later.  YOU SHOULD EXPECT: Some feelings of bloating in the abdomen. Passage of more gas than usual.  Walking can help get rid of the air that was put into your GI tract during the procedure and reduce the bloating. If you had a lower endoscopy (such as a colonoscopy or flexible sigmoidoscopy) you may notice spotting of blood in your stool or on the toilet paper. If you underwent a bowel prep for your procedure, then you may not have a normal bowel movement for a few days.  DIET: Your first meal following the procedure should be a light meal and then it is ok to progress to your normal diet.  A half-sandwich or bowl of soup is an example of a good first meal.  Heavy or fried foods are harder to digest and may make you feel nauseous or bloated.  Likewise meals heavy in dairy and vegetables can cause extra gas to form and this can also increase the bloating.  Drink plenty of fluids but you should avoid alcoholic beverages for 24 hours.  ACTIVITY: Your care partner should take you home directly after the procedure.  You should plan to take it easy, moving slowly for the rest of the day.  You can resume normal activity the day after the procedure however you should NOT DRIVE or use heavy machinery for 24 hours (because of the sedation medicines used during the test).    SYMPTOMS TO REPORT IMMEDIATELY: A gastroenterologist can be reached at any hour.  During normal business hours, 8:30 AM to 5:00 PM Monday through Friday,  call [phone].  After hours and on weekends, please call the GI answering service at [phone] who will take a message and have the physician on call contact you.   Following lower endoscopy (colonoscopy or flexible sigmoidoscopy):  Excessive amounts of blood in the stool  Significant tenderness or worsening of abdominal pains  Swelling of the abdomen that is new, acute  Fever of 100F or higher  FOLLOW UP: If any biopsies were taken you will be contacted by phone or by letter within the next 1-3 weeks.  Call your gastroenterologist if you have not heard about the biopsies in 3 weeks.  Our staff will call the home number listed on your records the next business day following your procedure to check on you and address any questions or concerns that you may have at that time regarding the information given to you following your procedure. This is a courtesy call and so if there is no answer at the home number and we have not heard from you through the emergency physician on call, we will assume that you have returned to your regular daily activities without incident.  SIGNATURES/CONFIDENTIALITY: You and/or your care partner have signed paperwork which will be entered into your electronic medical record.  These signatures attest to the fact that that the information above on your After Visit Summary has been reviewed and is understood.  Full responsibility of the confidentiality of this   discharge information lies with you and/or your care-partner.  Repeat colonoscopy in 10 years.  

## 2012-08-06 ENCOUNTER — Telehealth: Payer: Self-pay

## 2012-08-06 NOTE — Telephone Encounter (Signed)
Line busy x 2

## 2012-08-07 ENCOUNTER — Encounter: Payer: Self-pay | Admitting: *Deleted

## 2012-08-08 ENCOUNTER — Ambulatory Visit (INDEPENDENT_AMBULATORY_CARE_PROVIDER_SITE_OTHER): Payer: Medicare Other | Admitting: *Deleted

## 2012-08-08 DIAGNOSIS — I781 Nevus, non-neoplastic: Secondary | ICD-10-CM

## 2012-08-08 DIAGNOSIS — I83893 Varicose veins of bilateral lower extremities with other complications: Secondary | ICD-10-CM

## 2012-08-08 NOTE — Progress Notes (Signed)
Had planned on doing her last insurance covered sclerotherapy treatment today but she has not healed as well as expected. There is an area of blue "bruising" that she says has looked that way since the first treatment and is not changing. I am hopeful that over the next 6 weeks, the area will resolve. I encouraged her to continue using the Arnicare. May consider using Asclera on her next time to see if that works better for her. Will work on getting her DOS extended through [place] of 2014.

## 2012-08-14 ENCOUNTER — Ambulatory Visit: Payer: Medicare Other | Admitting: *Deleted

## 2012-08-15 ENCOUNTER — Encounter (HOSPITAL_COMMUNITY): Payer: Self-pay | Admitting: Pharmacist

## 2012-08-19 ENCOUNTER — Encounter (HOSPITAL_COMMUNITY)
Admission: RE | Admit: 2012-08-19 | Discharge: 2012-08-19 | Disposition: A | Discharge status: Home / Self Care | Payer: Medicare Other | Source: Ambulatory Visit | Attending: Obstetrics & Gynecology | Admitting: Obstetrics & Gynecology

## 2012-08-19 ENCOUNTER — Ambulatory Visit
Admission: RE | Admit: 2012-08-19 | Discharge: 2012-08-19 | Disposition: A | Discharge status: Home / Self Care | Payer: Medicare Other | Source: Ambulatory Visit | Attending: Obstetrics & Gynecology | Admitting: Obstetrics & Gynecology

## 2012-08-19 ENCOUNTER — Encounter (HOSPITAL_COMMUNITY): Payer: Self-pay

## 2012-08-19 DIAGNOSIS — Z1231 Encounter for screening mammogram for malignant neoplasm of breast: Secondary | ICD-10-CM

## 2012-08-19 NOTE — Patient Instructions (Addendum)
   Your procedure is scheduled on: Tuesday, Nov 19th  At  930am  Enter through the Main Entrance of [facility] at: 8am Pick up the phone at the desk and dial [phone] and inform us of your arrival.  Please call this number if you have any problems the morning of surgery: [phone]  Remember: Do not eat food after midnight: Monday Do not drink clear liquids after: Monday Take these medicines the morning of surgery with a SIP OF WATER:   Flecainide, Cardizem CD  Do not wear jewelry, make-up, or FINGER nail polish No metal in your hair or on your body. Do not wear lotions, powders, perfumes. You may wear deodorant.  Please use your CHG wash as directed prior to surgery.  Do not shave anywhere for at least 12 hours prior to first CHG shower.  Do not bring valuables to the hospital. Contacts, dentures or bridgework may not be worn into surgery.  Patients discharged on the day of surgery will not be allowed to drive home.  Home with husband Ronaldo Miyamoto.

## 2012-08-19 NOTE — Pre-Procedure Instructions (Signed)
Copy of labs and EKG requested and put on chart

## 2012-08-20 ENCOUNTER — Ambulatory Visit (HOSPITAL_COMMUNITY): Payer: Medicare Other | Admitting: Anesthesiology

## 2012-08-20 ENCOUNTER — Encounter (HOSPITAL_COMMUNITY)
Admission: RE | Disposition: A | Discharge status: Home / Self Care | Payer: Self-pay | Source: Ambulatory Visit | Attending: Obstetrics & Gynecology

## 2012-08-20 ENCOUNTER — Ambulatory Visit (HOSPITAL_COMMUNITY)
Admission: RE | Admit: 2012-08-20 | Discharge: 2012-08-20 | Disposition: A | Discharge status: Home / Self Care | Payer: Medicare Other | Source: Ambulatory Visit | Attending: Obstetrics & Gynecology | Admitting: Obstetrics & Gynecology

## 2012-08-20 ENCOUNTER — Encounter (HOSPITAL_COMMUNITY): Payer: Self-pay | Admitting: Anesthesiology

## 2012-08-20 DIAGNOSIS — Z01818 Encounter for other preprocedural examination: Secondary | ICD-10-CM | POA: Insufficient documentation

## 2012-08-20 DIAGNOSIS — N840 Polyp of corpus uteri: Secondary | ICD-10-CM | POA: Diagnosis present

## 2012-08-20 DIAGNOSIS — Z01812 Encounter for preprocedural laboratory examination: Secondary | ICD-10-CM | POA: Insufficient documentation

## 2012-08-20 HISTORY — PX: DILATATION & CURRETTAGE/HYSTEROSCOPY WITH RESECTOCOPE: SHX5572

## 2012-08-20 SURGERY — DILATATION & CURETTAGE/HYSTEROSCOPY WITH RESECTOCOPE
Anesthesia: General | Site: Uterus | Wound class: Clean Contaminated

## 2012-08-20 MED ORDER — MIDAZOLAM HCL 5 MG/5ML IJ SOLN
INTRAMUSCULAR | Status: DC | PRN
  Administered 2012-08-20: 1 mg via INTRAVENOUS

## 2012-08-20 MED ORDER — CEFAZOLIN SODIUM-DEXTROSE 2-3 GM-% IV SOLR
INTRAVENOUS | Status: AC
  Filled 2012-08-20: qty 50

## 2012-08-20 MED ORDER — LIDOCAINE HCL (CARDIAC) 20 MG/ML IV SOLN
INTRAVENOUS | Status: DC | PRN
  Administered 2012-08-20: 50 mg via INTRAVENOUS

## 2012-08-20 MED ORDER — MEPERIDINE HCL 25 MG/ML IJ SOLN: 6.2500 mg | INTRAMUSCULAR | Status: DC | PRN

## 2012-08-20 MED ORDER — KETOROLAC TROMETHAMINE 30 MG/ML IJ SOLN
INTRAMUSCULAR | Status: DC | PRN
  Administered 2012-08-20: 15 mg via INTRAVENOUS

## 2012-08-20 MED ORDER — KETOROLAC TROMETHAMINE 30 MG/ML IJ SOLN: 15.0000 mg | Freq: Once | INTRAMUSCULAR | Status: DC | PRN

## 2012-08-20 MED ORDER — CEFAZOLIN SODIUM-DEXTROSE 2-3 GM-% IV SOLR
2.0000 g | INTRAVENOUS | Status: AC
  Administered 2012-08-20: 2 g via INTRAVENOUS

## 2012-08-20 MED ORDER — GLYCINE 1.5 % IR SOLN
Status: DC | PRN
  Administered 2012-08-20: 1

## 2012-08-20 MED ORDER — ONDANSETRON HCL 4 MG/2ML IJ SOLN
INTRAMUSCULAR | Status: AC
  Filled 2012-08-20: qty 2

## 2012-08-20 MED ORDER — FENTANYL CITRATE 0.05 MG/ML IJ SOLN
INTRAMUSCULAR | Status: DC | PRN
  Administered 2012-08-20: 50 ug via INTRAVENOUS

## 2012-08-20 MED ORDER — KETOROLAC TROMETHAMINE 30 MG/ML IJ SOLN
INTRAMUSCULAR | Status: AC
  Filled 2012-08-20: qty 1

## 2012-08-20 MED ORDER — LIDOCAINE-EPINEPHRINE 1 %-1:100000 IJ SOLN
INTRAMUSCULAR | Status: DC | PRN
  Administered 2012-08-20: 10 mL

## 2012-08-20 MED ORDER — HYDROCODONE-ACETAMINOPHEN 5-500 MG PO TABS: ORAL_TABLET | ORAL | Status: DC

## 2012-08-20 MED ORDER — ONDANSETRON HCL 4 MG/2ML IJ SOLN: 4.0000 mg | Freq: Four times a day (QID) | INTRAMUSCULAR | Status: DC | PRN

## 2012-08-20 MED ORDER — PROPOFOL 10 MG/ML IV EMUL
INTRAVENOUS | Status: DC | PRN
  Administered 2012-08-20: 120 mg via INTRAVENOUS

## 2012-08-20 MED ORDER — FENTANYL CITRATE 0.05 MG/ML IJ SOLN: 25.0000 ug | INTRAMUSCULAR | Status: DC | PRN

## 2012-08-20 MED ORDER — MIDAZOLAM HCL 2 MG/2ML IJ SOLN
INTRAMUSCULAR | Status: AC
  Filled 2012-08-20: qty 2

## 2012-08-20 MED ORDER — ONDANSETRON HCL 4 MG/2ML IJ SOLN: 4.0000 mg | Freq: Once | INTRAMUSCULAR | Status: DC | PRN

## 2012-08-20 MED ORDER — ONDANSETRON HCL 4 MG/2ML IJ SOLN
INTRAMUSCULAR | Status: DC | PRN
  Administered 2012-08-20: 4 mg via INTRAVENOUS

## 2012-08-20 MED ORDER — ACETAMINOPHEN 325 MG PO TABS: 650.0000 mg | ORAL_TABLET | ORAL | Status: DC | PRN

## 2012-08-20 MED ORDER — LACTATED RINGERS IV SOLN
INTRAVENOUS | Status: DC
  Administered 2012-08-20 (×2): via INTRAVENOUS

## 2012-08-20 MED ORDER — ACETAMINOPHEN 650 MG RE SUPP
650.0000 mg | RECTAL | Status: DC | PRN
  Filled 2012-08-20: qty 1

## 2012-08-20 MED ORDER — LIDOCAINE HCL (CARDIAC) 20 MG/ML IV SOLN
INTRAVENOUS | Status: AC
  Filled 2012-08-20: qty 5

## 2012-08-20 MED ORDER — PROPOFOL 10 MG/ML IV EMUL
INTRAVENOUS | Status: AC
  Filled 2012-08-20: qty 20

## 2012-08-20 MED ORDER — FENTANYL CITRATE 0.05 MG/ML IJ SOLN
INTRAMUSCULAR | Status: AC
  Filled 2012-08-20: qty 2

## 2012-08-20 SURGICAL SUPPLY — 16 items
CANISTER SUCTION 2500CC (MISCELLANEOUS) ×2 IMPLANT
CATH ROBINSON RED A/P 16FR (CATHETERS) ×2 IMPLANT
CLOTH BEACON ORANGE TIMEOUT ST (SAFETY) ×2 IMPLANT
CONTAINER PREFILL 10% NBF 60ML (FORM) ×4 IMPLANT
DILATOR CANAL MILEX (MISCELLANEOUS) ×2 IMPLANT
DRESSING TELFA 8X3 (GAUZE/BANDAGES/DRESSINGS) ×2 IMPLANT
ELECT REM PT RETURN 9FT ADLT (ELECTROSURGICAL) ×2
ELECTRODE REM PT RTRN 9FT ADLT (ELECTROSURGICAL) ×1 IMPLANT
GLOVE BIOGEL PI IND STRL 7.0 (GLOVE) ×1 IMPLANT
GLOVE BIOGEL PI INDICATOR 7.0 (GLOVE) ×1
GLOVE ECLIPSE 6.5 STRL STRAW (GLOVE) ×4 IMPLANT
GOWN STRL REIN XL XLG (GOWN DISPOSABLE) ×4 IMPLANT
PACK HYSTEROSCOPY LF (CUSTOM PROCEDURE TRAY) ×2 IMPLANT
PAD OB MATERNITY 4.3X12.25 (PERSONAL CARE ITEMS) ×2 IMPLANT
TOWEL OR 17X24 6PK STRL BLUE (TOWEL DISPOSABLE) ×4 IMPLANT
WATER STERILE IRR 1000ML POUR (IV SOLUTION) ×2 IMPLANT

## 2012-08-20 NOTE — Anesthesia Preprocedure Evaluation (Signed)
Anesthesia Evaluation  Patient identified by MRN, date of birth, ID band Patient awake    Reviewed: Allergy & Precautions, H&P , NPO status , Patient's Chart, lab work & pertinent test results  Airway Mallampati: I TM Distance: >3 FB Neck ROM: full    Dental No notable dental hx. (+) Teeth Intact   Pulmonary neg pulmonary ROS,    Pulmonary exam normal       Cardiovascular negative cardio ROS      Neuro/Psych negative neurological ROS  negative psych ROS   GI/Hepatic negative GI ROS, Neg liver ROS,   Endo/Other  negative endocrine ROS  Renal/GU negative Renal ROS  negative genitourinary   Musculoskeletal negative musculoskeletal ROS (+)   Abdominal Normal abdominal exam  (+)   Peds negative pediatric ROS (+)  Hematology negative hematology ROS (+)   Anesthesia Other Findings   Reproductive/Obstetrics negative OB ROS                           Anesthesia Physical Anesthesia Plan  ASA: II  Anesthesia Plan: General   Post-op Pain Management:    Induction: Intravenous  Airway Management Planned: LMA  Additional Equipment:   Intra-op Plan:   Post-operative Plan:   Informed Consent: I have reviewed the patients History and Physical, chart, labs and discussed the procedure including the risks, benefits and alternatives for the proposed anesthesia with the patient or authorized representative who has indicated his/her understanding and acceptance.     Plan Discussed with: CRNA and Surgeon  Anesthesia Plan Comments:         Anesthesia Quick Evaluation  

## 2012-08-20 NOTE — Transfer of Care (Signed)
Immediate Anesthesia Transfer of Care Note  Patient: [name]  Procedure(s) Performed: Procedure(s) (LRB) with comments: DILATATION & CURETTAGE/HYSTEROSCOPY WITH RESECTOCOPE (N/A)  Patient Location: PACU  Anesthesia Type:General  Level of Consciousness: awake, alert  and oriented  Airway & Oxygen Therapy: Patient Spontanous Breathing and Patient connected to nasal cannula oxygen  Post-op Assessment: Report given to PACU RN and Post -op Vital signs reviewed and stable  Post vital signs: Reviewed and stable  Complications: No apparent anesthesia complications

## 2012-08-20 NOTE — H&P (Signed)
[name] is an 71 y.o. female here for hysteroscopy and polyp resection.  She has been having watery vaginal discharge for several months.  She was evaluated with a sonohysterogram showing an endometrial polyp.  Resection has been recommended.  Patient is here with spouse.  Risks and benefits discussed.  Pertinent Gynecological History: Menses: post-menopausal Bleeding: none Contraception: post menopausal status DES exposure: denies Blood transfusions: none Sexually transmitted diseases: no past history Previous GYN Procedures: none  Last mammogram: normal Date: this week Last pap: normal Date: 2013 OB History: G2, P2   Menstrual History: Menarche age: early teens No LMP recorded. Patient is postmenopausal.    Past Medical History  Diagnosis Date  . Hyperlipidemia   . Drug therapy     Flecainide therapy started March, 2011, treadmill March, 2011, no exercise-induced ectopy  . Atrial fibrillation     Very rare episodes  / episode March, 2011  CHADS score O  . Ejection fraction     EF 55-65%, echo, October, 2009  . Mitral regurgitation     Mild, echo, October, 2009  . MVP (mitral valve prolapse)     per pt "mild"  . Chronic kidney disease     hx of kidney stones    Past Surgical History  Procedure Date  . Tonsillectomy and adenoidectomy   . Varicose vein ablation     left  . Colonoscopy     Family History  Problem Relation Age of Onset  . Parkinsonism Father   . Colon cancer Neg Hx   . Esophageal cancer Neg Hx   . Rectal cancer Neg Hx   . Stomach cancer Neg Hx     Social History:  reports that she has never smoked. She has never used smokeless tobacco. She reports that she drinks alcohol. She reports that she does not use illicit drugs.  Allergies:  Allergies  Allergen Reactions  . Tetanus Toxoid     Fever, swelling at site    Prescriptions prior to admission  Medication Sig Dispense Refill  . aspirin 81 MG tablet Take 81 mg by mouth daily. TAKING  81 MG DAILY      . calcium-vitamin D 250-100 MG-UNIT per tablet Take 1 tablet by mouth daily.       Marland Kitchen diltiazem (CARDIZEM CD) 120 MG 24 hr capsule TAKE 1 CAPSULE BY MOUTH ONCE A DAY  90 capsule  4  . ESTRING 2 MG vaginal ring Place 2 mg vaginally every 3 (three) months. Wears for 3 months      . fish oil-omega-3 fatty acids 1000 MG capsule Take 2 g by mouth daily.        . flecainide (TAMBOCOR) 50 MG tablet TAKE ONE TABLET BY MOUTH EVERY 12 HOURS  180 tablet  4  . Magnesium 500 MG TABS Take 1 tablet by mouth daily.       . Multiple Vitamin (MULTIVITAMIN) capsule Take 1 capsule by mouth daily.        . simvastatin (ZOCOR) 40 MG tablet Take 40 mg by mouth at bedtime.        . Vitamin D, Ergocalciferol, (DRISDOL) 50000 UNITS CAPS Take 50,000 Units by mouth every 7 (seven) days.        Review of Systems  Constitutional: Negative for fever and chills.  Eyes: Negative for blurred vision.  Respiratory: Negative for cough.   Cardiovascular: Negative for chest pain and palpitations.  Gastrointestinal: Negative for heartburn, nausea, vomiting and abdominal pain.  Genitourinary: Negative  for dysuria, urgency and frequency.  Neurological: Negative for dizziness and headaches.  Endo/Heme/Allergies: Does not bruise/bleed easily.  Psychiatric/Behavioral: Negative for depression.    Blood pressure 130/73, pulse 80, temperature 97.3 F (36.3 C), temperature source Oral, resp. rate 16, height 5\' 6"  (1.676 m), weight 63.504 kg (140 lb), SpO2 100.00%. Physical Exam  Vitals reviewed. Constitutional: She is oriented to person, place, and time. She appears well-developed and well-nourished.  HENT:  Head: Normocephalic and atraumatic.  Cardiovascular: Normal rate and regular rhythm.   Respiratory: Effort normal and breath sounds normal.  GI: Soft. Bowel sounds are normal.  Musculoskeletal: Normal range of motion.  Neurological: She is alert and oriented to person, place, and time.  Skin: Skin is warm and  dry.  Psychiatric: She has a normal mood and affect.    No results found for this or any previous visit (from the past 24 hour(s)).  Mm Digital Screening  08/19/2012  *RADIOLOGY REPORT*  Clinical Data: Screening.  DIGITAL BILATERAL SCREENING MAMMOGRAM WITH CAD DIGITAL BREAST TOMOSYNTHESIS  Digital breast tomosynthesis images are acquired in two projections.  These images are reviewed in combination with the digital mammogram, confirming the findings below.  Comparison:  Previous exams.  Findings:  The breast tissue is heterogeneously dense. No suspicious masses, architectural distortion, or calcifications are present.  Images were processed with CAD.  IMPRESSION: No mammographic evidence of malignancy.  A result letter of this screening mammogram will be mailed directly to the patient.  RECOMMENDATION: Screening mammogram in one year. (Code:SM-B-01Y)  BI-RADS CATEGORY 1:  Negative.   Original Report Authenticated By: Baird Lyons, M.D.     Assessment/Plan: 71 year old wf with history of endometrial polyp here for resection, hysteroscopy, and Dilation and curettage.  Risks and benefits have been described. She is ready to proceed.  [name] 08/20/2012, 9:05 AM

## 2012-08-20 NOTE — Op Note (Signed)
08/20/2012  10:12 AM  PATIENT:  [name]  71 y.o. female  PRE-OPERATIVE DIAGNOSIS:  Endometrial Polyp  POST-OPERATIVE DIAGNOSIS:  Endometrial Polyp  PROCEDURE:  Procedure(s): DILATATION & CURETTAGE/HYSTEROSCOPY WITH POLYP RESECTION  SURGEON:  Jevin Camino SUZANNE  ASSISTANTS: OR Staff   ANESTHESIA:   general  ESTIMATED BLOOD LOSS: * No blood loss amount entered *  BLOOD ADMINISTERED:none   FLUIDS: 1000cc LR  UOP: 75cc drained with I&O catheterization at beginning of procedure  SPECIMEN:  Endometrial polyp x2 and endometrial curettings  DISPOSITION OF SPECIMEN:  PATHOLOGY  FINDINGS: Thin endometrium with the presence of 2 endometrial polyps  DESCRIPTION OF OPERATION: Patient taken to the OR.  She is positioned in the supine position. Informed consent is present and on the chart.  Running IV is in place.  2grams Ancef given IV.  Sequential compression devices are on her lower extremities and functioning properly.  Once adequate anesthesia is placed and confirmed, her legs were placed in the low lithotomy position in [place] stirrups.  Legs were then lifted to the high lithotomy position. The perineum, inner thighs, and vagina were prepped with Betadine x3. A red rubber Foley catheter was used as an in and out catheterizations drain the bladder of all urine. The patient was then draped in a normal standard fashion.  A bivalve speculum was placed in the vagina. The anterior lip of the cervix was grasped with a single-tooth tenaculum. A paracervical block of 1% Xylocaine mixed 1:1 with epinephrine (1:100,000 units) was placed by injecting 2.5 cc at each the 3, and  6, 9, and 12 o'clock positions.  An os finder was used to dilate the cervix. The uterus sounded to 5 1/2 cm. The cervix was dilated up to #21 with Shawnie Pons dilators. Then a 3.9 mm diagnostic hysteroscope was used to visualize endometrial cavity. 1.5% glycine was used as a hysteroscopic fluid. There are actually 2 endometrial  polyps are present. Endometrial tissue is otherwise thin. Photo documentation was made. The tubal ostia were noted bilaterally. The hysteroscope was removed and the cervix was dilated up to #23. Using a polyp forceps each polyp was grasped and removed without difficulty. There is a small amount tissue remaining from the second polyp and a #1 toothed curette was used to curette his portion of the endometrial cavity. With revisualization of the endometrial cavity with the hysteroscopy there is no remaining polyp tissue. A curetting of the entire endometrial cavity was performed at this point. There is minimal tissue obtained. The cavity was revisualized final time and photodocumentation was made. The hysteroscope was removed at this point. The single-tooth tenaculum was removed. There is no bleeding noted from the tenaculum sites. There was just a small amount of bleeding from the cervical os. The speculum was removed. 15 mg IV Toradol was given by the CRNA. The Betadine prep was washed off the skin. The legs were lower back to the supine position and taken out of the lithotomy position. Sponge, laps, needle, and instrument counts were correct x2. The patient did well during the procedure. She was awakened from anesthesia and taken to the recovery room stable condition.   COUNTS:  YES  PLAN OF CARE: Transfer to PACU

## 2012-08-20 NOTE — Anesthesia Postprocedure Evaluation (Signed)
Anesthesia Post Note  Patient: [name]  Procedure(s) Performed: Procedure(s) (LRB): DILATATION & CURETTAGE/HYSTEROSCOPY WITH RESECTOCOPE (N/A)  Anesthesia type: General  Patient location: PACU  Post pain: Pain level controlled  Post assessment: Post-op Vital signs reviewed  Last Vitals:  Filed Vitals:   08/20/12 1030  BP: 105/45  Pulse: 71  Temp:   Resp: 18    Post vital signs: Reviewed  Level of consciousness: sedated  Complications: No apparent anesthesia complicationsfj

## 2012-08-21 ENCOUNTER — Encounter (HOSPITAL_COMMUNITY): Payer: Self-pay | Admitting: Obstetrics & Gynecology

## 2012-11-05 ENCOUNTER — Encounter: Payer: Self-pay | Admitting: *Deleted

## 2012-11-06 ENCOUNTER — Ambulatory Visit (INDEPENDENT_AMBULATORY_CARE_PROVIDER_SITE_OTHER): Payer: Medicare Other | Admitting: *Deleted

## 2012-11-06 DIAGNOSIS — I83893 Varicose veins of bilateral lower extremities with other complications: Secondary | ICD-10-CM

## 2012-11-06 DIAGNOSIS — I781 Nevus, non-neoplastic: Secondary | ICD-10-CM

## 2012-11-06 NOTE — Progress Notes (Signed)
X=.3% Sotradecol administered with a 27g butterfly.  Patient received a total of 11cc.  Jalecia is a slow healer but the areas previously treated that looked so blue have faded. She wants to try one more time to see if we can get some good results. She did have a significant skin rxn to most of the injections which is not uncommon but usually not seen. The areas were red and puffy which usually mean the med is working appropriately. Retreated all vessels I could get in to. Easy access. Her skin is thin and transperant. Will follow prn and hope for good results.   Photos: yes  Compression stockings applied: yes

## 2012-11-11 ENCOUNTER — Encounter: Payer: Self-pay | Admitting: Vascular Surgery

## 2012-12-27 ENCOUNTER — Other Ambulatory Visit: Payer: Self-pay | Admitting: *Deleted

## 2013-03-17 ENCOUNTER — Telehealth: Payer: Self-pay | Admitting: Obstetrics & Gynecology

## 2013-03-17 DIAGNOSIS — N898 Other specified noninflammatory disorders of vagina: Secondary | ICD-10-CM

## 2013-03-17 DIAGNOSIS — N840 Polyp of corpus uteri: Secondary | ICD-10-CM

## 2013-03-17 NOTE — Telephone Encounter (Signed)
Pt concerned about having a watery discharge, the last time she had this problem she ended up  Having a hysteroscopy, polyp resection and a D&C  08/2012. Not sure if she needs to schedule a PUS Out of town, will be back Wednesday  03/18/13. Should I just schedule a OV.  Please advise.

## 2013-03-17 NOTE — Telephone Encounter (Signed)
Please put on schedule for Sonohysterography.  Do not need to precert.  Maybe can be done early next week.  Order entered.

## 2013-03-17 NOTE — Telephone Encounter (Signed)
Patient calling re: Watery discharge and wants to schedule an ultrasound please.

## 2013-03-18 ENCOUNTER — Telehealth: Payer: Self-pay | Admitting: *Deleted

## 2013-03-18 NOTE — Telephone Encounter (Signed)
Patient only avail this week on Wed or Thurs.  No PUS appt avail this week. Patient out of town until 04-20-13 Appt sched for 04-21-13.

## 2013-03-18 NOTE — Telephone Encounter (Signed)
See next phone note regarding appt.

## 2013-03-18 NOTE — Telephone Encounter (Signed)
PUS/SHGM Scheduled for 04/21/13 @ 1:30 and office consult w/Dr. Hyacinth Meeker to follow.

## 2013-03-18 NOTE — Telephone Encounter (Signed)
Returning call regarding ultrasound appt.

## 2013-03-18 NOTE — Telephone Encounter (Signed)
Called to sched PUS/shgm appt. LMTCB.

## 2013-03-18 NOTE — Telephone Encounter (Signed)
Sched appt for 03-24-13 at 230 but patient is not aware, will need to see if this will work for her.

## 2013-03-24 ENCOUNTER — Other Ambulatory Visit: Payer: Self-pay | Admitting: Obstetrics & Gynecology

## 2013-03-24 ENCOUNTER — Other Ambulatory Visit: Payer: Self-pay

## 2013-04-02 ENCOUNTER — Other Ambulatory Visit: Payer: Self-pay | Admitting: Obstetrics & Gynecology

## 2013-04-02 DIAGNOSIS — N898 Other specified noninflammatory disorders of vagina: Secondary | ICD-10-CM

## 2013-04-02 DIAGNOSIS — N840 Polyp of corpus uteri: Secondary | ICD-10-CM

## 2013-04-02 DIAGNOSIS — N83339 Acquired atrophy of ovary and fallopian tube, unspecified side: Secondary | ICD-10-CM

## 2013-04-21 ENCOUNTER — Ambulatory Visit (INDEPENDENT_AMBULATORY_CARE_PROVIDER_SITE_OTHER): Payer: Medicare Other | Admitting: Obstetrics & Gynecology

## 2013-04-21 ENCOUNTER — Ambulatory Visit (INDEPENDENT_AMBULATORY_CARE_PROVIDER_SITE_OTHER): Payer: Medicare Other

## 2013-04-21 DIAGNOSIS — N83339 Acquired atrophy of ovary and fallopian tube, unspecified side: Secondary | ICD-10-CM

## 2013-04-21 DIAGNOSIS — D259 Leiomyoma of uterus, unspecified: Secondary | ICD-10-CM

## 2013-04-21 DIAGNOSIS — D252 Subserosal leiomyoma of uterus: Secondary | ICD-10-CM

## 2013-04-21 DIAGNOSIS — N952 Postmenopausal atrophic vaginitis: Secondary | ICD-10-CM

## 2013-04-21 DIAGNOSIS — N840 Polyp of corpus uteri: Secondary | ICD-10-CM

## 2013-04-21 DIAGNOSIS — N898 Other specified noninflammatory disorders of vagina: Secondary | ICD-10-CM

## 2013-04-21 NOTE — Progress Notes (Signed)
72 y.o.Marriedfemale here for a pelvic ultrasound with sonohystogram.  Has experienced, again, some watery discharge.  She is sure this is not urine.  She had something similar last year and had a benign endometrial polyp found on ultrasound.  This was removed without difficulty and pathology was benign.  She wants to make sure she does not have a new polyp or other gyn issue.  Indication:  Vaginal discharge, history of endometrial polyp  No LMP recorded. Patient is postmenopausal.  Contraception:   PMP  Technique:  Both transabdominal and transvaginal ultrasound examinations of the pelvis were performed. Transabdominal technique was performed for global imaging of the pelvis including uterus, ovaries, adnexal regions, and pelvic cul-de-sac. It was necessary to proceed with endovaginal exam following the abdominal ultrasound transabdominal exam to visualize the endometrium and adnexa. Color and duplex Doppler ultrasound was utilized to evaluate blood flow to the ovaries.    FINDINGS: UTERUS:  5.0 x 4.2 x 2.5cm with calcified right fundal 9 x 11mm fibroid, and 11 x 10mm posterior fibroid.  Both subserosal EMS: 0.59mm ADNEXA: Left ovary 1.5 x 1.1 x 0.9cm, Right ovary 1.5 x 0.8 x 0.8cm.  Both appear atrophic.  CUL DE SAC: neg, no free fluid  SHSG:  After obtaining appropriate verbal consent from patient, the cervix was visualized using a speculum, and prepped with betadine.  A tenaculum  was applied to the cervix.  Dilation of the cervix was necessary. The catheter was passed into the uterus and sterile saline introduced, with the following findings:  No lesions noted.  No biopsy was performed due to hysteroscopic polyp resection, D&C done 08/20/12 with negative pathology.  Discussed with patient findings.  Images reviewed.  She is satisfied and does not feel the need to proceed with additional testing.  She has decided to stop the Estring due to cost.  She has tried Vagifem in the past but states  it is not much cheaper either.  She will use OTC products for vaginal dryness, if needed.  She is welcome to call with any new issues or concerns and she knows I do want her to call if similar problem occurs again.  Assessment:  H/O endometrial polyp, vaginal discharge/fluid, subserosal fibroids  Plan:  For now, will just watch and wait.  She is comfortable that ultrasound was essentially the same except that the endometrium on this ultrasound is very thin.  ~15 minutes spent with patient >50% of time was in face to face discussion of above.

## 2013-04-23 ENCOUNTER — Encounter: Payer: Self-pay | Admitting: Obstetrics & Gynecology

## 2013-04-23 NOTE — Patient Instructions (Signed)
Please call if you continue to have symptoms

## 2013-05-07 ENCOUNTER — Other Ambulatory Visit: Payer: Self-pay

## 2013-05-13 ENCOUNTER — Other Ambulatory Visit: Payer: Self-pay | Admitting: Cardiology

## 2013-06-04 ENCOUNTER — Encounter: Payer: Self-pay | Admitting: Obstetrics & Gynecology

## 2013-06-04 ENCOUNTER — Ambulatory Visit (INDEPENDENT_AMBULATORY_CARE_PROVIDER_SITE_OTHER): Payer: Medicare Other | Admitting: Gynecology

## 2013-06-04 VITALS — BP 126/72 | Temp 98.1°F | Resp 14 | Ht 65.5 in | Wt 142.0 lb

## 2013-06-04 DIAGNOSIS — R300 Dysuria: Secondary | ICD-10-CM

## 2013-06-04 LAB — POCT URINALYSIS DIPSTICK: Urobilinogen, UA: NEGATIVE

## 2013-06-04 MED ORDER — NITROFURANTOIN MONOHYD MACRO 100 MG PO CAPS: 100.0000 mg | ORAL_CAPSULE | Freq: Two times a day (BID) | ORAL | Status: DC

## 2013-06-04 NOTE — Progress Notes (Signed)
Subjective:     Patient ID: [name], female   DOB: 1941-05-22, 72 y.o.   MRN: [id number]  HPI Comments: Pt reports burning and frequency that began yesterday.  No fever or chills.  Pt reports history of recurrent cystitis.  Pt reports walking outside with increase sweat and not changing undergarments immediately.  Pt was not holding her urine.  Pt is anxious as she is going out of town for 2w.    Urinary Frequency  This is a new problem. The current episode started yesterday. The problem has been gradually improving. There has been no fever. The fever has been present for less than 1 day. Associated symptoms include frequency. Pertinent negatives include no flank pain, hematuria, hesitancy or urgency. She has tried home medications and increased fluids for the symptoms. Her past medical history is significant for recurrent UTIs.     Review of Systems  Genitourinary: Positive for frequency. Negative for hesitancy, urgency, hematuria and flank pain.       Objective:   Physical Exam  Constitutional: She is oriented to person, place, and time. She appears well-developed and well-nourished.  Neurological: She is alert and oriented to person, place, and time.  Pelvic exam: VULVA: normal appearing vulva with no masses, tenderness or lesions, VAGINA: atrophic, no discharge, CERVIX: atrophic, UTERUS: atrophic, ADNEXA: no masses. No suprapubic tenderness NO CVAT U/a:  Trace Leuk esterase, trace blood, otherwise negative    Assessment:     Questionable cystitis     Plan:     Check urine culture

## 2013-06-06 LAB — URINE CULTURE
Colony Count: NO GROWTH
Organism ID, Bacteria: NO GROWTH

## 2013-06-19 ENCOUNTER — Encounter: Payer: Self-pay | Admitting: Obstetrics & Gynecology

## 2013-08-02 ENCOUNTER — Other Ambulatory Visit: Payer: Self-pay | Admitting: Cardiology

## 2013-08-07 ENCOUNTER — Other Ambulatory Visit: Payer: Self-pay

## 2013-08-18 ENCOUNTER — Other Ambulatory Visit: Payer: Self-pay

## 2013-08-18 DIAGNOSIS — Z1231 Encounter for screening mammogram for malignant neoplasm of breast: Secondary | ICD-10-CM

## 2013-08-22 ENCOUNTER — Encounter: Payer: Self-pay | Admitting: Obstetrics & Gynecology

## 2013-08-22 ENCOUNTER — Ambulatory Visit (INDEPENDENT_AMBULATORY_CARE_PROVIDER_SITE_OTHER): Payer: Medicare Other | Admitting: Obstetrics & Gynecology

## 2013-08-22 VITALS — BP 118/72 | HR 64 | Resp 16 | Ht 65.5 in | Wt 144.0 lb

## 2013-08-22 DIAGNOSIS — Z01419 Encounter for gynecological examination (general) (routine) without abnormal findings: Secondary | ICD-10-CM

## 2013-08-22 DIAGNOSIS — Z124 Encounter for screening for malignant neoplasm of cervix: Secondary | ICD-10-CM

## 2013-08-22 MED ORDER — ESTRADIOL 2 MG VA RING: 2.0000 mg | VAGINAL_RING | VAGINAL | Status: DC

## 2013-08-22 MED ORDER — VITAMIN D (ERGOCALCIFEROL) 1.25 MG (50000 UNIT) PO CAPS: 50000.0000 [IU] | ORAL_CAPSULE | ORAL | Status: DC

## 2013-08-22 NOTE — Progress Notes (Signed)
72 y.o. [id number] MarriedCaucasianF here for annual exam.  [place].  H/O recurrent discharge with endometrial polyp in u/s.  They just sold [employer].  No VB or discharge.  Did labs with Perini yesterday.    Patient's last menstrual period was 10/02/1997.          Sexually active: yes  The current method of family planning is none.    Exercising: yes  pilates and zumba Smoker:  no  Health Maintenance: Pap:  07/02/12 WNL History of abnormal Pap:  yes MMG:  08/19/12 normal, scheduled 12/18 Colonoscopy:  2013 repeat in 10 years  BMD:   2012, -1.1/-1.1 TDaP:  Up to date with Dr Waynard Edwards Screening Labs: PCP, Hb today: PCP, Urine today: PCP   reports that she has never smoked. She has never used smokeless tobacco. She reports that she drinks about 0.5 ounces of alcohol per week. She reports that she does not use illicit drugs.  Past Medical History  Diagnosis Date  . Hyperlipidemia   . Drug therapy     Flecainide therapy started March, 2011, treadmill March, 2011, no exercise-induced ectopy  . Atrial fibrillation     Very rare episodes  / episode March, 2011  CHADS score O  . Ejection fraction     EF 55-65%, echo, October, 2009  . Mitral regurgitation     Mild, echo, October, 2009  . MVP (mitral valve prolapse)     per pt "mild"  . Chronic kidney disease     hx of kidney stones  . Abnormal Pap smear     Past Surgical History  Procedure Laterality Date  . Tonsillectomy and adenoidectomy    . Varicose vein ablation      left  . Colonoscopy    . Dilatation & currettage/hysteroscopy with resectocope  08/20/2012    Procedure: DILATATION & CURETTAGE/HYSTEROSCOPY WITH RESECTOCOPE;  Surgeon: Annamaria Boots, MD;  Location: [facility];  Service: Gynecology;  Laterality: N/A;  . Gynecologic cryosurgery  1980's  . Hysteroscopy  11/3    polyp resection    Current Outpatient Prescriptions  Medication Sig Dispense Refill  . aspirin 81  MG tablet Take 81 mg by mouth daily. TAKING 81 MG DAILY      . calcium-vitamin D 250-100 MG-UNIT per tablet Take 1 tablet by mouth daily.       Marland Kitchen diltiazem (CARDIZEM CD) 120 MG 24 hr capsule TAKE 1 CAPSULE BY MOUTH ONCE A DAY  90 capsule  0  . ESTRING 2 MG vaginal ring Place 2 mg vaginally every 3 (three) months. Wears for 3 months      . fish oil-omega-3 fatty acids 1000 MG capsule Take 2 g by mouth daily.        . flecainide (TAMBOCOR) 50 MG tablet TAKE ONE TABLET BY MOUTH EVERY 12 HOURS  120 tablet  0  . Magnesium 500 MG TABS Take 1 tablet by mouth daily.       . Multiple Vitamin (MULTIVITAMIN) capsule Take 1 capsule by mouth daily.        . simvastatin (ZOCOR) 40 MG tablet Take 40 mg by mouth at bedtime.        . Vitamin D, Ergocalciferol, (DRISDOL) 50000 UNITS CAPS Take 50,000 Units by mouth every 7 (seven) days.       No current facility-administered medications for this visit.    Family History  Problem Relation Age of Onset  . Parkinsonism Father   .  Colon cancer Neg Hx   . Esophageal cancer Neg Hx   . Rectal cancer Neg Hx   . Stomach cancer Neg Hx     ROS:  Pertinent items are noted in HPI.  Otherwise, a comprehensive ROS was negative.  Exam:   BP 118/72  Pulse 64  Resp 16  Ht 5' 5.5" (1.664 m)  Wt 144 lb (65.318 kg)  BMI 23.59 kg/m2  LMP 10/02/1997    Height: 5' 5.5" (166.4 cm)  Ht Readings from Last 3 Encounters:  08/22/13 5' 5.5" (1.664 m)  06/04/13 5' 5.5" (1.664 m)  08/14/12 5\' 6"  (1.676 m)    General appearance: alert, cooperative and appears stated age Head: Normocephalic, without obvious abnormality, atraumatic Neck: no adenopathy, supple, symmetrical, trachea midline and thyroid normal to inspection and palpation Lungs: clear to auscultation bilaterally Breasts: normal appearance, no masses or tenderness Heart: regular rate and rhythm Abdomen: soft, non-tender; bowel sounds normal; no masses,  no organomegaly Extremities: extremities normal, atraumatic,  no cyanosis or edema Skin: Skin color, texture, turgor normal. No rashes or lesions Lymph nodes: Cervical, supraclavicular, and axillary nodes normal. No abnormal inguinal nodes palpated Neurologic: Grossly normal   Pelvic: External genitalia:  no lesions              Urethra:  normal appearing urethra with no masses, tenderness or lesions              Bartholins and Skenes: normal                 Vagina: normal appearing vagina with normal color and discharge, no lesions              Cervix: no lesions              Pap taken: yes Bimanual Exam:  Uterus:  normal size, contour, position, consistency, mobility, non-tender              Adnexa: normal adnexa and no mass, fullness, tenderness               Rectovaginal: Confirms               Anus:  normal sphincter tone, no lesions  A:  Well Woman with normal exam PMP, No HRT H/O endometrial polyp removed last year.  H/O watery discharge which has resolved. H/O low Vit D.  On supplementation  P:   Mammogram yearly.  Pt has dense breasts.  Doing 3D yearly pap smear today.  Pt prefers yearly. On Vit D, 50K weekly.  Rx to pharmacy   return annually or prn  An After Visit Summary was printed and given to the patient.

## 2013-08-22 NOTE — Patient Instructions (Signed)

## 2013-09-08 ENCOUNTER — Other Ambulatory Visit: Payer: Self-pay | Admitting: Internal Medicine

## 2013-09-08 DIAGNOSIS — M899 Disorder of bone, unspecified: Secondary | ICD-10-CM

## 2013-09-18 ENCOUNTER — Ambulatory Visit: Payer: Medicare Other

## 2013-09-18 ENCOUNTER — Ambulatory Visit
Admission: RE | Admit: 2013-09-18 | Discharge: 2013-09-18 | Disposition: A | Discharge status: Home / Self Care | Payer: Medicare Other | Source: Ambulatory Visit

## 2013-09-18 DIAGNOSIS — Z1231 Encounter for screening mammogram for malignant neoplasm of breast: Secondary | ICD-10-CM

## 2013-10-13 ENCOUNTER — Ambulatory Visit
Admission: RE | Admit: 2013-10-13 | Discharge: 2013-10-13 | Disposition: A | Discharge status: Home / Self Care | Payer: Medicare Other | Source: Ambulatory Visit | Attending: Internal Medicine | Admitting: Internal Medicine

## 2013-10-13 DIAGNOSIS — M899 Disorder of bone, unspecified: Secondary | ICD-10-CM

## 2013-10-13 DIAGNOSIS — M949 Disorder of cartilage, unspecified: Principal | ICD-10-CM

## 2013-10-20 ENCOUNTER — Other Ambulatory Visit: Payer: Self-pay

## 2013-10-20 MED ORDER — DILTIAZEM HCL ER COATED BEADS 120 MG PO CP24: ORAL_CAPSULE | ORAL | Status: DC

## 2013-10-21 ENCOUNTER — Other Ambulatory Visit: Payer: Self-pay

## 2013-10-21 MED ORDER — FLECAINIDE ACETATE 50 MG PO TABS: ORAL_TABLET | ORAL | Status: DC

## 2013-10-24 ENCOUNTER — Telehealth: Payer: Self-pay | Admitting: Cardiology

## 2013-10-24 ENCOUNTER — Other Ambulatory Visit: Payer: Self-pay

## 2013-10-24 MED ORDER — FLECAINIDE ACETATE 50 MG PO TABS: ORAL_TABLET | ORAL | Status: DC

## 2013-10-24 MED ORDER — DILTIAZEM HCL ER COATED BEADS 120 MG PO CP24: ORAL_CAPSULE | ORAL | Status: DC

## 2013-10-24 NOTE — Telephone Encounter (Signed)
I explained the importance of the pt keeping her f/u appts.,she verbalized understanding and agreed to schedule a f/u with Dr Ron Parker on 3/6. Per pt request I have granted her a 90 day supply of her Diltiazem and Flecainide with the understanding that if she does not keep this appt we will not be able to grant a 90 day supply in the future, she verbalized understanding.

## 2013-10-24 NOTE — Telephone Encounter (Signed)
New message     Pt was told that she could not have her medication refilled until she made an appt.  She want to know why does she have to make an appt first.  However, she can not remember the last time she saw Dr Ron Parker.

## 2013-10-30 ENCOUNTER — Other Ambulatory Visit: Payer: Self-pay | Admitting: Cardiology

## 2013-10-30 MED ORDER — DILTIAZEM HCL ER COATED BEADS 120 MG PO CP24: ORAL_CAPSULE | ORAL | Status: DC

## 2013-11-07 ENCOUNTER — Other Ambulatory Visit: Payer: Self-pay | Admitting: *Deleted

## 2013-11-07 MED ORDER — FLECAINIDE ACETATE 50 MG PO TABS: ORAL_TABLET | ORAL | Status: DC

## 2013-12-03 ENCOUNTER — Encounter: Payer: Self-pay | Admitting: Cardiology

## 2013-12-05 ENCOUNTER — Encounter: Payer: Self-pay | Admitting: Cardiology

## 2013-12-05 ENCOUNTER — Ambulatory Visit (INDEPENDENT_AMBULATORY_CARE_PROVIDER_SITE_OTHER): Payer: Medicare Other | Admitting: Cardiology

## 2013-12-05 VITALS — BP 124/72 | HR 77 | Ht 66.0 in | Wt 147.0 lb

## 2013-12-05 DIAGNOSIS — Z79899 Other long term (current) drug therapy: Secondary | ICD-10-CM

## 2013-12-05 DIAGNOSIS — I059 Rheumatic mitral valve disease, unspecified: Secondary | ICD-10-CM

## 2013-12-05 DIAGNOSIS — E785 Hyperlipidemia, unspecified: Secondary | ICD-10-CM

## 2013-12-05 DIAGNOSIS — I4891 Unspecified atrial fibrillation: Secondary | ICD-10-CM

## 2013-12-05 DIAGNOSIS — I340 Nonrheumatic mitral (valve) insufficiency: Secondary | ICD-10-CM

## 2013-12-05 NOTE — Patient Instructions (Signed)
**Note De-Identified Varun Jourdan Obfuscation** Your physician recommends that you continue on your current medications as directed. Please refer to the Current Medication list given to you today. We will refill your medications for 2 years since you follow up with Dr Ron Parker every 2 years.  Your physician wants you to follow-up in: 2 years. You will receive a reminder letter in the mail two months in advance. If you don't receive a letter, please call our office to schedule the follow-up appointment.

## 2013-12-05 NOTE — Progress Notes (Signed)
HPI  Patient is seen to followup history of paroxysmal atrial fibrillation. She also has very mild mitral valvular disease. She's doing very well. She has been on low-dose flecainide for many years and has done very well. She has also been on aspirin. Her risk for is very low. Up to this point anticoagulation has not been necessary.  Allergies  Allergen Reactions  . Tetanus Toxoid     Fever, swelling at site    Current Outpatient Prescriptions  Medication Sig Dispense Refill  . aspirin 81 MG tablet Take 81 mg by mouth daily. TAKING 81 MG DAILY      . calcium-vitamin D 250-100 MG-UNIT per tablet Take 1 tablet by mouth daily.       Marland Kitchen diltiazem (CARDIZEM CD) 120 MG 24 hr capsule TAKE 1 CAPSULE BY MOUTH ONCE A DAY  30 capsule  1  . estradiol (ESTRING) 2 MG vaginal ring Place 2 mg vaginally every 3 (three) months. Wears for 3 months  1 each  4  . fish oil-omega-3 fatty acids 1000 MG capsule Take 1 g by mouth daily.       . flecainide (TAMBOCOR) 50 MG tablet TAKE ONE TABLET BY MOUTH EVERY 12 HOURS  180 tablet  0  . Magnesium 500 MG TABS Take 1 tablet by mouth daily.       . Multiple Vitamin (MULTIVITAMIN) capsule Take 1 capsule by mouth daily.        . simvastatin (ZOCOR) 40 MG tablet Take 40 mg by mouth at bedtime.        . Vitamin D, Ergocalciferol, (DRISDOL) 50000 UNITS CAPS capsule Take 1 capsule (50,000 Units total) by mouth every 7 (seven) days.  30 capsule  3   No current facility-administered medications for this visit.    History   Social History  . Marital Status: Married    Spouse Name: N/A    Number of Children: N/A  . Years of Education: N/A   Occupational History  . Not on file.   Social History Main Topics  . Smoking status: Never Smoker   . Smokeless tobacco: Never Used  . Alcohol Use: .5 - 1 oz/week    1-2 drink(s) per week     Comment: socially  . Drug Use: No  . Sexual Activity: Yes    Partners: Male    Birth Control/ Protection: Post-menopausal    Other Topics Concern  . Not on file   Social History Narrative  . No narrative on file    Family History  Problem Relation Age of Onset  . Parkinsonism Father   . Colon cancer Neg Hx   . Esophageal cancer Neg Hx   . Rectal cancer Neg Hx   . Stomach cancer Neg Hx     Past Medical History  Diagnosis Date  . Hyperlipidemia   . Drug therapy     Flecainide therapy started March, 2011, treadmill March, 2011, no exercise-induced ectopy  . Atrial fibrillation     Very rare episodes  / episode March, 2011  CHADS score O  . Ejection fraction     EF 55-65%, echo, October, 2009  . Mitral regurgitation     Mild, echo, October, 2009  . MVP (mitral valve prolapse)     per pt "mild"  . Chronic kidney disease     hx of kidney stones  . Abnormal Pap smear     Past Surgical History  Procedure Laterality Date  . Tonsillectomy and adenoidectomy    .  Varicose vein ablation      left  . Colonoscopy    . Dilatation & currettage/hysteroscopy with resectocope  08/20/2012    Procedure: [place];  Surgeon: Lyman Speller, MD;  Location: [facility];  Service: Gynecology;  Laterality: N/A;  . Gynecologic cryosurgery  1980's  . Hysteroscopy  11/3    polyp resection    Patient Active Problem List   Diagnosis Date Noted  . Varicose veins of lower extremities with other complications 95/63/8756  . Hyperlipidemia   . Drug therapy   . Atrial fibrillation   . Ejection fraction   . Mitral regurgitation     ROS   Patient denies fever, chills, headache, sweats, rash, change in vision, change in hearing, chest pain, cough, nausea vomiting, urinary symptoms. All other systems are reviewed and are negative.  PHYSICAL EXAM  Patient is oriented to person time and place. Affect is normal. There is no jugulovenous distention. Lungs are clear. Respiratory effort is nonlabored. Cardiac exam reveals S1 and S2. There no clicks or significant murmurs. The  abdomen is soft. Is no peripheral edema. There no musculoskeletal deformities. There are no skin rashes.  Filed Vitals:   12/05/13 0929  BP: 124/72  Pulse: 77  Height: 5\' 6"  (1.676 m)  Weight: 147 lb (66.679 kg)   EKG is done today and reviewed by me. There is normal sinus rhythm. The EKG is normal.  ASSESSMENT & PLAN

## 2013-12-05 NOTE — Assessment & Plan Note (Signed)
Her lipids are being treated. No change in therapy. 

## 2013-12-05 NOTE — Assessment & Plan Note (Signed)
She continues on low-dose flecainide. This continues to be a safe drug for her. No change in therapy.

## 2013-12-05 NOTE — Assessment & Plan Note (Signed)
We know that she has mild mitral regurgitation by echo most recently done November, 2012. There is no reason to repeat this study at this time. No further workup.

## 2013-12-05 NOTE — Assessment & Plan Note (Signed)
Her last episode was March, 2011. Her risk score is very low. She does not need to be anticoagulated. More recent data suggests that there is no definite benefit from aspirin for her atrial fibrillation. However her overall risk score is greater than 7.5%. She does have hyperlipidemia and is being treated. It is therefore very reasonable for her to continue on low-dose aspirin. I agree with her primary physician that could be used twice weekly as she does have some increased bruisability.

## 2014-01-27 ENCOUNTER — Other Ambulatory Visit: Payer: Self-pay | Admitting: *Deleted

## 2014-01-27 MED ORDER — FLECAINIDE ACETATE 50 MG PO TABS: ORAL_TABLET | ORAL | Status: DC

## 2014-01-27 MED ORDER — DILTIAZEM HCL ER COATED BEADS 120 MG PO CP24: ORAL_CAPSULE | ORAL | Status: DC

## 2014-07-07 ENCOUNTER — Other Ambulatory Visit: Payer: Self-pay | Admitting: *Deleted

## 2014-07-07 DIAGNOSIS — I83892 Varicose veins of left lower extremities with other complications: Secondary | ICD-10-CM

## 2014-07-31 ENCOUNTER — Encounter: Payer: Self-pay | Admitting: Vascular Surgery

## 2014-08-03 ENCOUNTER — Ambulatory Visit (INDEPENDENT_AMBULATORY_CARE_PROVIDER_SITE_OTHER): Payer: Medicare Other | Admitting: Vascular Surgery

## 2014-08-03 ENCOUNTER — Ambulatory Visit (HOSPITAL_COMMUNITY)
Admission: RE | Admit: 2014-08-03 | Discharge: 2014-08-03 | Disposition: A | Discharge status: Home / Self Care | Payer: Medicare Other | Source: Ambulatory Visit | Attending: Vascular Surgery | Admitting: Vascular Surgery

## 2014-08-03 ENCOUNTER — Encounter: Payer: Self-pay | Admitting: Vascular Surgery

## 2014-08-03 VITALS — BP 125/71 | HR 78 | Resp 16 | Ht 66.0 in | Wt 140.0 lb

## 2014-08-03 DIAGNOSIS — I8390 Asymptomatic varicose veins of unspecified lower extremity: Secondary | ICD-10-CM | POA: Insufficient documentation

## 2014-08-03 DIAGNOSIS — I868 Varicose veins of other specified sites: Secondary | ICD-10-CM

## 2014-08-03 DIAGNOSIS — I8393 Asymptomatic varicose veins of bilateral lower extremities: Secondary | ICD-10-CM

## 2014-08-03 DIAGNOSIS — I83892 Varicose veins of left lower extremities with other complications: Secondary | ICD-10-CM | POA: Diagnosis present

## 2014-08-03 NOTE — Progress Notes (Signed)
Subjective:     Patient ID: [name], female   DOB: 1941-07-22, 73 y.o.   MRN: [id number]  HPIthis 73 year old female has had previous laser ablation of the left small saphenous vein by me in 2013. She has had some sclerotherapy for spider veins in both lower extremities. She returns today with a new prominent vein on the lateral aspect of the left lower leg. This does cause aching and itching as the day progresses. She also has noticed more spider veins in the right lower extremity. She has no history of DVT or thrombophlebitis stasis ulcers or bleeding.  Past Medical History  Diagnosis Date  . Hyperlipidemia   . Drug therapy     Flecainide therapy started March, 2011, treadmill March, 2011, no exercise-induced ectopy  . Atrial fibrillation     Very rare episodes  / episode March, 2011  CHADS score O  . Ejection fraction     EF 55-65%, echo, October, 2009  . Mitral regurgitation     Mild, echo, October, 2009  . MVP (mitral valve prolapse)     per pt "mild"  . Chronic kidney disease     hx of kidney stones  . Abnormal Pap smear     History  Substance Use Topics  . Smoking status: Never Smoker   . Smokeless tobacco: Never Used  . Alcohol Use: 0.5 - 1.0 oz/week    1-2 drink(s) per week     Comment: socially    Family History  Problem Relation Age of Onset  . Parkinsonism Father   . Colon cancer Neg Hx   . Esophageal cancer Neg Hx   . Rectal cancer Neg Hx   . Stomach cancer Neg Hx     Allergies  Allergen Reactions  . Tetanus Toxoid     Fever, swelling at site    Current outpatient prescriptions: aspirin 81 MG tablet, Take 81 mg by mouth 3 (three) times a week. TAKING 81 MG DAILY, Disp: , Rfl: ;  calcium-vitamin D 250-100 MG-UNIT per tablet, Take 1 tablet by mouth daily. , Disp: , Rfl: ;  diltiazem (CARDIZEM CD) 120 MG 24 hr capsule, TAKE 1 CAPSULE BY MOUTH ONCE A DAY, Disp: 90 capsule, Rfl: 3 estradiol (ESTRING) 2 MG vaginal ring, Place 2 mg vaginally every 3  (three) months. Wears for 3 months, Disp: 1 each, Rfl: 4;  fish oil-omega-3 fatty acids 1000 MG capsule, Take 1 g by mouth daily. , Disp: , Rfl: ;  flecainide (TAMBOCOR) 50 MG tablet, TAKE ONE TABLET BY MOUTH EVERY 12 HOURS, Disp: 180 tablet, Rfl: 3;  Magnesium 500 MG TABS, Take 1 tablet by mouth daily. , Disp: , Rfl:  Multiple Vitamin (MULTIVITAMIN) capsule, Take 1 capsule by mouth daily.  , Disp: , Rfl: ;  simvastatin (ZOCOR) 40 MG tablet, Take 40 mg by mouth at bedtime.  , Disp: , Rfl: ;  Vitamin D, Ergocalciferol, (DRISDOL) 50000 UNITS CAPS capsule, Take 1 capsule (50,000 Units total) by mouth every 7 (seven) days., Disp: 30 capsule, Rfl: 3  BP 125/71 mmHg  Pulse 78  Resp 16  Ht 5\' 6"  (1.676 m)  Wt 140 lb (63.504 kg)  BMI 22.61 kg/m2  LMP 10/02/1997  Body mass index is 22.61 kg/(m^2).           Review of SystemsDenies chest pain, dyspnea on exertion, PND, orthopnea, hemoptysis     Objective:   Physical Exam BP 125/71 mmHg  Pulse 78  Resp 16  Ht 5'  6" (1.676 m)  Wt 140 lb (63.504 kg)  BMI 22.61 kg/m2  LMP 10/02/1997  Gen. Well-developed well-nourished female in no apparent distress alert and oriented 3 Left leg with 3 posterior cells pedis pulse palpable. No distal edema noted. Prominent linear row of small varicosities in the lower third laterally left leg with some spider veins proximal and distal. Also diffuse spider veins and thigh and calf area. Right leg with diffuse spider veins thigh and calf with no bulging varicosities noted.  Today I will order venous duplex exam the left leg which are reviewed and interpreted. There is no DVT. There is reflux in a few areas of the left great saphenous vein but it is a small caliber vein. Left small saphenous vein is totally closed-consistent with previous ablation     Assessment:     Spider and reticular veins left leg with successful laser ablation left small saphenous vein 2 years ago and no significant reflux in left  great saphenous vein     Plan:     Treatment proposed his foam sclerotherapy. Patient will consider this

## 2014-08-11 ENCOUNTER — Ambulatory Visit: Payer: Medicare Other | Admitting: Vascular Surgery

## 2014-08-11 ENCOUNTER — Encounter (HOSPITAL_COMMUNITY): Payer: Medicare Other

## 2014-09-08 ENCOUNTER — Encounter: Payer: Self-pay | Admitting: *Deleted

## 2014-09-09 ENCOUNTER — Ambulatory Visit (INDEPENDENT_AMBULATORY_CARE_PROVIDER_SITE_OTHER): Payer: Medicare Other | Admitting: *Deleted

## 2014-09-09 DIAGNOSIS — I83899 Varicose veins of unspecified lower extremities with other complications: Secondary | ICD-10-CM

## 2014-09-09 DIAGNOSIS — I739 Peripheral vascular disease, unspecified: Secondary | ICD-10-CM

## 2014-09-09 DIAGNOSIS — I8393 Asymptomatic varicose veins of bilateral lower extremities: Secondary | ICD-10-CM

## 2014-09-09 NOTE — Progress Notes (Signed)
X=.3% Sotradecol administered with a 27g butterfly.  Patient received a total of 6cc.  Injected large reticular on left calf that had an raised area that looked like it could bleed. Easy access. Used up rest of solution on other areas she pointed to. No adverse skin rxn noticed_usual rxn. Follow prn.  Photos: No.  Compression stockings applied: Yes.  and an ace  Note: She will need new stockings next time and she only wanted to try one syringe since she had a skin rxn last treatment.

## 2014-09-09 NOTE — Addendum Note (Signed)
Addended by: Mena Goes on: 09/09/2014 04:54 PM   Modules accepted: Orders

## 2014-09-10 ENCOUNTER — Encounter: Payer: Self-pay | Admitting: *Deleted

## 2014-09-18 ENCOUNTER — Encounter: Payer: Self-pay | Admitting: Obstetrics & Gynecology

## 2014-09-18 ENCOUNTER — Ambulatory Visit (INDEPENDENT_AMBULATORY_CARE_PROVIDER_SITE_OTHER): Payer: Medicare Other | Admitting: Obstetrics & Gynecology

## 2014-09-18 VITALS — BP 110/60 | HR 72 | Resp 18 | Ht 65.0 in | Wt 141.0 lb

## 2014-09-18 DIAGNOSIS — Z01419 Encounter for gynecological examination (general) (routine) without abnormal findings: Secondary | ICD-10-CM

## 2014-09-18 DIAGNOSIS — Z124 Encounter for screening for malignant neoplasm of cervix: Secondary | ICD-10-CM

## 2014-09-18 MED ORDER — VITAMIN D (ERGOCALCIFEROL) 1.25 MG (50000 UNIT) PO CAPS: 50000.0000 [IU] | ORAL_CAPSULE | ORAL | Status: DC

## 2014-09-18 MED ORDER — ESTRADIOL 2 MG VA RING: 2.0000 mg | VAGINAL_RING | VAGINAL | Status: DC

## 2014-09-18 NOTE — Progress Notes (Signed)
73 y.o. [id number] MarriedCaucasianF here for annual exam.  Doing well.  No vaginal bleeding.  Will have both children here for the holidays.   PCP:  Dr. Joylene Draft.  Saw last week.  Blood work was all normal.   Patient's last menstrual period was 10/02/1997.          Sexually active: Yes.    The current method of family planning is post menopausal status.    Exercising: Yes.    Pilates and zumba Smoker:  no  Health Maintenance: Pap:  08/2013 neg History of abnormal Pap:  yes MMG:  09/2013 BIRADS1:Neg  Colonoscopy:  08/2012 Normal- every 10 years  BMD:   1/15 -1.0/-1.3 TDaP: up to date. With Dr. Joylene Draft Screening Labs: PCP, Hb today: PCP, Urine today: PCP   reports that she has never smoked. She has never used smokeless tobacco. She reports that she drinks about 1.2 - 1.8 oz of alcohol per week. She reports that she does not use illicit drugs.  Past Medical History  Diagnosis Date  . Hyperlipidemia   . Drug therapy     Flecainide therapy started March, 2011, treadmill March, 2011, no exercise-induced ectopy  . Atrial fibrillation     Very rare episodes  / episode March, 2011  CHADS score O  . Ejection fraction     EF 55-65%, echo, October, 2009  . Mitral regurgitation     Mild, echo, October, 2009  . MVP (mitral valve prolapse)     per pt "mild"  . Chronic kidney disease     hx of kidney stones  . Abnormal Pap smear     Past Surgical History  Procedure Laterality Date  . Tonsillectomy and adenoidectomy    . Varicose vein ablation      left  . Colonoscopy    . Dilatation & currettage/hysteroscopy with resectocope  08/20/2012    Procedure: [place];  Surgeon: Lyman Speller, MD;  Location: [facility];  Service: Gynecology;  Laterality: N/A;  . Gynecologic cryosurgery  1980's  . Hysteroscopy  11/3    polyp resection    Current Outpatient Prescriptions  Medication Sig Dispense Refill  . aspirin 81 MG tablet Take 81 mg by mouth 3  (three) times a week. TAKING 81 MG DAILY    . calcium-vitamin D 250-100 MG-UNIT per tablet Take 1 tablet by mouth daily.     Marland Kitchen diltiazem (CARDIZEM CD) 120 MG 24 hr capsule TAKE 1 CAPSULE BY MOUTH ONCE A DAY 90 capsule 3  . estradiol (ESTRING) 2 MG vaginal ring Place 2 mg vaginally every 3 (three) months. Wears for 3 months 1 each 4  . fish oil-omega-3 fatty acids 1000 MG capsule Take 1 g by mouth daily.     . flecainide (TAMBOCOR) 50 MG tablet TAKE ONE TABLET BY MOUTH EVERY 12 HOURS 180 tablet 3  . Magnesium 500 MG TABS Take 1 tablet by mouth daily.     . Multiple Vitamin (MULTIVITAMIN) capsule Take 1 capsule by mouth daily.      . simvastatin (ZOCOR) 80 MG tablet Take 40 mg by mouth daily at 6 PM.   11  . Vitamin D, Ergocalciferol, (DRISDOL) 50000 UNITS CAPS capsule Take 1 capsule (50,000 Units total) by mouth every 7 (seven) days. 30 capsule 3   No current facility-administered medications for this visit.    Family History  Problem Relation Age of Onset  . Parkinsonism Father   . Colon cancer Neg Hx   .  Esophageal cancer Neg Hx   . Rectal cancer Neg Hx   . Stomach cancer Neg Hx     ROS:  Pertinent items are noted in HPI.  Otherwise, a comprehensive ROS was negative.  Exam:   BP 110/60 mmHg  Pulse 72  Resp 18  Ht 5\' 5"  (1.651 m)  Wt 141 lb (63.957 kg)  BMI 23.46 kg/m2  LMP 10/02/1997  Weight change: -3#   Height: 5\' 5"  (165.1 cm)  Ht Readings from Last 3 Encounters:  09/18/14 5\' 5"  (1.651 m)  08/03/14 5\' 6"  (1.676 m)  12/05/13 5\' 6"  (1.676 m)    General appearance: alert, cooperative and appears stated age Head: Normocephalic, without obvious abnormality, atraumatic Neck: no adenopathy, supple, symmetrical, trachea midline and thyroid normal to inspection and palpation Lungs: clear to auscultation bilaterally Breasts: normal appearance, no masses or tenderness Heart: regular rate and rhythm Abdomen: soft, non-tender; bowel sounds normal; no masses,  no  organomegaly Extremities: extremities normal, atraumatic, no cyanosis or edema Skin: Skin color, texture, turgor normal. No rashes or lesions Lymph nodes: Cervical, supraclavicular, and axillary nodes normal. No abnormal inguinal nodes palpated Neurologic: Grossly normal   Pelvic: External genitalia:  no lesions              Urethra:  normal appearing urethra with no masses, tenderness or lesions              Bartholins and Skenes: normal                 Vagina: normal appearing vagina with normal color and discharge, no lesions              Cervix: no lesions              Pap taken: Yes.   Bimanual Exam:  Uterus:  normal size, contour, position, consistency, mobility, non-tender              Adnexa: normal adnexa and no mass, fullness, tenderness               Rectovaginal: Confirms               Anus:  normal sphincter tone, no lesions  Chaperone was present for exam.  A:  Well Woman with normal exam PMP, No HRT H/O endometrial polyp removed 2013 H/O low Vit D. On supplementation Vaginal atrophic changes  P: Mammogram yearly. Pt has dense breasts. Doing 3D yearly pap smear today. Pt prefers yearly. On Vit D, 50K weekly. Rx to pharmacy Estring vaginal ring 2mg  pv every three months Screening labs with Dr. Joylene Draft last week return annually or prn

## 2014-09-21 LAB — IPS PAP SMEAR ONLY

## 2014-09-22 ENCOUNTER — Telehealth: Payer: Self-pay

## 2014-09-22 NOTE — Telephone Encounter (Signed)
Patient notified of results. See result note.//kn

## 2014-09-22 NOTE — Telephone Encounter (Signed)
Lmtcb//kn 

## 2014-09-22 NOTE — Telephone Encounter (Signed)
-----   Message from Lyman Speller, MD sent at 09/21/2014  4:51 PM EST ----- Please call pt.  There is yeast on her pap.  If she is have any symptoms, can treat with Diflucan 150mg  po x 1, repeat 48 hours.  02 recall.

## 2014-09-22 NOTE — Telephone Encounter (Signed)
Pt returned call  Best: [phone]  bf

## 2014-10-09 ENCOUNTER — Other Ambulatory Visit: Payer: Self-pay

## 2014-10-09 DIAGNOSIS — Z1231 Encounter for screening mammogram for malignant neoplasm of breast: Secondary | ICD-10-CM

## 2014-10-19 ENCOUNTER — Ambulatory Visit
Admission: RE | Admit: 2014-10-19 | Discharge: 2014-10-19 | Disposition: A | Discharge status: Home / Self Care | Payer: Medicare Other | Source: Ambulatory Visit

## 2014-10-19 DIAGNOSIS — Z1231 Encounter for screening mammogram for malignant neoplasm of breast: Secondary | ICD-10-CM

## 2015-02-11 ENCOUNTER — Other Ambulatory Visit: Payer: Self-pay | Admitting: Cardiology

## 2015-02-12 ENCOUNTER — Other Ambulatory Visit: Payer: Self-pay

## 2015-02-12 MED ORDER — FLECAINIDE ACETATE 50 MG PO TABS: 50.0000 mg | ORAL_TABLET | Freq: Two times a day (BID) | ORAL | Status: DC

## 2015-02-17 ENCOUNTER — Inpatient Hospital Stay (HOSPITAL_COMMUNITY)
Admission: AD | Admit: 2015-02-17 | Discharge: 2015-02-17 | Disposition: A | Discharge status: Home / Self Care | Payer: Medicare Other | Source: Ambulatory Visit | Attending: Obstetrics and Gynecology | Admitting: Obstetrics and Gynecology

## 2015-02-17 ENCOUNTER — Telehealth: Payer: Self-pay | Admitting: Obstetrics & Gynecology

## 2015-02-17 ENCOUNTER — Inpatient Hospital Stay (HOSPITAL_COMMUNITY): Payer: Medicare Other

## 2015-02-17 ENCOUNTER — Encounter (HOSPITAL_COMMUNITY): Payer: Self-pay | Admitting: *Deleted

## 2015-02-17 ENCOUNTER — Other Ambulatory Visit: Payer: Self-pay | Admitting: *Deleted

## 2015-02-17 DIAGNOSIS — R1032 Left lower quadrant pain: Secondary | ICD-10-CM | POA: Diagnosis present

## 2015-02-17 DIAGNOSIS — B373 Candidiasis of vulva and vagina: Secondary | ICD-10-CM | POA: Diagnosis not present

## 2015-02-17 DIAGNOSIS — Z87442 Personal history of urinary calculi: Secondary | ICD-10-CM | POA: Insufficient documentation

## 2015-02-17 DIAGNOSIS — K5900 Constipation, unspecified: Secondary | ICD-10-CM | POA: Diagnosis not present

## 2015-02-17 DIAGNOSIS — B3731 Acute candidiasis of vulva and vagina: Secondary | ICD-10-CM

## 2015-02-17 DIAGNOSIS — O26899 Other specified pregnancy related conditions, unspecified trimester: Secondary | ICD-10-CM

## 2015-02-17 DIAGNOSIS — R109 Unspecified abdominal pain: Secondary | ICD-10-CM

## 2015-02-17 LAB — CBC
HCT: 41.2 % (ref 36.0–46.0)
Hemoglobin: 14 g/dL (ref 12.0–15.0)
MCH: 30.1 pg (ref 26.0–34.0)
MCHC: 34 g/dL (ref 30.0–36.0)
MCV: 88.6 fL (ref 78.0–100.0)
Platelets: 250 10*3/uL (ref 150–400)
RBC: 4.65 MIL/uL (ref 3.87–5.11)
RDW: 12.2 % (ref 11.5–15.5)
WBC: 9.7 10*3/uL (ref 4.0–10.5)

## 2015-02-17 LAB — URINALYSIS, ROUTINE W REFLEX MICROSCOPIC
Bilirubin Urine: NEGATIVE
GLUCOSE, UA: NEGATIVE mg/dL
Ketones, ur: NEGATIVE mg/dL
Leukocytes, UA: NEGATIVE
Nitrite: NEGATIVE
Protein, ur: NEGATIVE mg/dL
SPECIFIC GRAVITY, URINE: 1.02 (ref 1.005–1.030)
Urobilinogen, UA: 0.2 mg/dL (ref 0.0–1.0)
pH: 6.5 (ref 5.0–8.0)

## 2015-02-17 LAB — URINE MICROSCOPIC-ADD ON

## 2015-02-17 MED ORDER — FLEET ENEMA 7-19 GM/118ML RE ENEM
1.0000 | ENEMA | Freq: Once | RECTAL | Status: AC
  Administered 2015-02-17: 1 via RECTAL

## 2015-02-17 MED ORDER — DILTIAZEM HCL ER COATED BEADS 120 MG PO CP24: ORAL_CAPSULE | ORAL | Status: DC

## 2015-02-17 MED ORDER — KETOROLAC TROMETHAMINE 60 MG/2ML IM SOLN
60.0000 mg | Freq: Once | INTRAMUSCULAR | Status: AC
  Administered 2015-02-17: 60 mg via INTRAMUSCULAR
  Filled 2015-02-17: qty 2

## 2015-02-17 MED ORDER — FLUCONAZOLE 150 MG PO TABS: 150.0000 mg | ORAL_TABLET | Freq: Every day | ORAL | Status: DC

## 2015-02-17 NOTE — Telephone Encounter (Signed)
Pts husband, Dr Annamaria Boots, called requesting immediate appointment for patient. He states she began having left lower quadrant pain 1.5 hrs ago. Dr Sabra Heck pt.

## 2015-02-17 NOTE — Telephone Encounter (Signed)
Spoke with patient's husband. Advised I have spoken with Dr.Silva who recommends patient be seen for evaluation. Advised may come to see Dr.Silva this morning for evaluation but may need to be see at [facility] for ultrasound as we do not have ultrasound here in the office today. Or they may go directly over to MAU at [facility] hospital for evaluation and have ultrasound done if needed. Husband is agreeable and would like to go to MAU at [facility] at this time. Advised I will let Dr.Silva know and to return call to office if they need anything.  Routing to provider for final review.

## 2015-02-17 NOTE — Telephone Encounter (Signed)
Spoke with patient's husband Dr.Slay. Husband states that patient woke up this morning with lower back pain and when she went to exercise she began to have left lower quadrant pain. Pain is mild to moderate and radiates to her vagina. Has been experiencing pain for 2 hours now. Took 2 tylenol with little relief. Thought it was GI related but has had no change in BM. Patient uses Estring which she removed this morning when pain started. Denies discharge, urinary symptoms, fevers, nausea, and vomiting. Husband states "I feel she needs a pelvic exam and maybe even an ultrasound if you have that in the office today." Advised we do not have ultrasound here in the office today. Advised I will speak with Dr.Silva regarding symptoms and return call with further recommendations. Husband is agreeable.

## 2015-02-17 NOTE — MAU Note (Signed)
Pain in LLQ, noted LLQ pain thought it might be back pain- but not feeling that.  Went on to exercise- pain got worse. Felt like was going to have diarrhea- sat on toilet, did have a small bowel movement- but was not loose.  Cramping has continued- pain is left side down to vagina. No nausea, no fever.  Took out vag ring;- was afraid it was going to be forced out with stool

## 2015-02-17 NOTE — MAU Provider Note (Signed)
History     CSN: [id number]  Arrival date and time: 02/17/15 1023   First Provider Initiated Contact with Patient 02/17/15 1149      Chief Complaint  Patient presents with  . Abdominal Pain   HPI   Ms. [name] is a 74 y.o. female [phone] who presents with abominal pain. She was on her way to the gym this morning and noted left lower quadrant pain that worsened in the car; she was unable to work out and had to go back home due to the pain. She felt that it was GI related and went home and sat on the toilet. She had a small, formed BM without any abnormalities. The pain did not subside following the BM. The pain is described as stabbing at times with radiation to her left lower back side. She denies vaginal bleeding. She does have a history of kidney stones.   OB History    Gravida Para Term Preterm AB TAB SAB Ectopic Multiple Living   2 2 2       2       Past Medical History  Diagnosis Date  . Hyperlipidemia   . Drug therapy     Flecainide therapy started March, 2011, treadmill March, 2011, no exercise-induced ectopy  . Atrial fibrillation     Very rare episodes  / episode March, 2011  CHADS score O  . Ejection fraction     EF 55-65%, echo, October, 2009  . Mitral regurgitation     Mild, echo, October, 2009  . MVP (mitral valve prolapse)     per pt "mild"  . Chronic kidney disease     hx of kidney stones  . Abnormal Pap smear     Past Surgical History  Procedure Laterality Date  . Tonsillectomy and adenoidectomy    . Varicose vein ablation      left  . Colonoscopy    . Dilatation & currettage/hysteroscopy with resectocope  08/20/2012    Procedure: [place];  Surgeon: Lyman Speller, MD;  Location: [facility];  Service: Gynecology;  Laterality: N/A;  . Gynecologic cryosurgery  1980's  . Hysteroscopy  11/3    polyp resection    Family History  Problem Relation Age of Onset  . Parkinsonism Father   . Colon  cancer Neg Hx   . Esophageal cancer Neg Hx   . Rectal cancer Neg Hx   . Stomach cancer Neg Hx     History  Substance Use Topics  . Smoking status: Never Smoker   . Smokeless tobacco: Never Used  . Alcohol Use: 1.2 - 1.8 oz/week    1-2 Standard drinks or equivalent, 1 Glasses of wine per week     Comment: socially    Allergies:  Allergies  Allergen Reactions  . Tetanus Toxoid     Fever, swelling at site    Prescriptions prior to admission  Medication Sig Dispense Refill Last Dose  . aspirin 81 MG tablet Take 81 mg by mouth 3 (three) times a week. TAKING 81 MG DAILY   02/16/2015 at Unknown time  . B Complex Vitamins (VITAMIN B COMPLEX) TABS Take 1 tablet by mouth at bedtime.   02/16/2015 at Unknown time  . calcium-vitamin D 250-100 MG-UNIT per tablet Take 1 tablet by mouth daily.    02/16/2015 at Unknown time  . fish oil-omega-3 fatty acids 1000 MG capsule Take 1 g by mouth daily.    02/16/2015 at Unknown time  .  flecainide (TAMBOCOR) 50 MG tablet Take 1 tablet (50 mg total) by mouth every 12 (twelve) hours. 180 tablet 2 02/17/2015 at Unknown time  . Magnesium 500 MG TABS Take 1 tablet by mouth daily. Pt takes this about three or four times a week.   02/16/2015 at Unknown time  . Multiple Vitamin (MULTIVITAMIN) capsule Take 1 capsule by mouth daily.     02/16/2015 at Unknown time  . simvastatin (ZOCOR) 80 MG tablet Take 40 mg by mouth daily at 6 PM.   11 02/16/2015 at Unknown time  . estradiol (ESTRING) 2 MG vaginal ring Place 2 mg vaginally every 3 (three) months. Wears for 3 months (Patient taking differently: Place 2 mg vaginally every 3 (three) months. Wears for 3 months - 4 months.  Today 02/17/15, pt removed the ring.Marland Kitchen) 1 each 4   . Vitamin D, Ergocalciferol, (DRISDOL) 50000 UNITS CAPS capsule Take 1 capsule (50,000 Units total) by mouth every 7 (seven) days. 30 capsule 3 02/14/2015   Results for orders placed or performed during the hospital encounter of 02/17/15 (from the past 48  hour(s))  Urinalysis, Routine w reflex microscopic     Status: Abnormal   Collection Time: 02/17/15 10:50 AM  Result Value Ref Range   Color, Urine YELLOW YELLOW   APPearance CLEAR CLEAR   Specific Gravity, Urine 1.020 1.005 - 1.030   pH 6.5 5.0 - 8.0   Glucose, UA NEGATIVE NEGATIVE mg/dL   Hgb urine dipstick TRACE (A) NEGATIVE   Bilirubin Urine NEGATIVE NEGATIVE   Ketones, ur NEGATIVE NEGATIVE mg/dL   Protein, ur NEGATIVE NEGATIVE mg/dL   Urobilinogen, UA 0.2 0.0 - 1.0 mg/dL   Nitrite NEGATIVE NEGATIVE   Leukocytes, UA NEGATIVE NEGATIVE  Urine microscopic-add on     Status: Abnormal   Collection Time: 02/17/15 10:50 AM  Result Value Ref Range   Squamous Epithelial / LPF FEW (A) RARE   RBC / HPF 0-2 <3 RBC/hpf   Urine-Other RARE YEAST     Comment: MUCOUS PRESENT AMORPHOUS URATES/PHOSPHATES     Results for orders placed or performed during the hospital encounter of 02/17/15 (from the past 48 hour(s))  Urinalysis, Routine w reflex microscopic     Status: Abnormal   Collection Time: 02/17/15 10:50 AM  Result Value Ref Range   Color, Urine YELLOW YELLOW   APPearance CLEAR CLEAR   Specific Gravity, Urine 1.020 1.005 - 1.030   pH 6.5 5.0 - 8.0   Glucose, UA NEGATIVE NEGATIVE mg/dL   Hgb urine dipstick TRACE (A) NEGATIVE   Bilirubin Urine NEGATIVE NEGATIVE   Ketones, ur NEGATIVE NEGATIVE mg/dL   Protein, ur NEGATIVE NEGATIVE mg/dL   Urobilinogen, UA 0.2 0.0 - 1.0 mg/dL   Nitrite NEGATIVE NEGATIVE   Leukocytes, UA NEGATIVE NEGATIVE  Urine microscopic-add on     Status: Abnormal   Collection Time: 02/17/15 10:50 AM  Result Value Ref Range   Squamous Epithelial / LPF FEW (A) RARE   RBC / HPF 0-2 <3 RBC/hpf   Urine-Other RARE YEAST     Comment: MUCOUS PRESENT AMORPHOUS URATES/PHOSPHATES   CBC     Status: None   Collection Time: 02/17/15 12:29 PM  Result Value Ref Range   WBC 9.7 4.0 - 10.5 K/uL   RBC 4.65 3.87 - 5.11 MIL/uL   Hemoglobin 14.0 12.0 - 15.0 g/dL   HCT 41.2  36.0 - 46.0 %   MCV 88.6 78.0 - 100.0 fL   MCH 30.1 26.0 - 34.0 pg  MCHC 34.0 30.0 - 36.0 g/dL   RDW 12.2 11.5 - 15.5 %   Platelets 250 150 - 400 K/uL   US Transvaginal Non-ob  02/17/2015   CLINICAL DATA:  Left lower quadrant pain  EXAM: TRANSABDOMINAL AND TRANSVAGINAL ULTRASOUND OF PELVIS  DOPPLER ULTRASOUND OF OVARIES  TECHNIQUE: Both transabdominal and transvaginal ultrasound examinations of the pelvis were performed. Transabdominal technique was performed for global imaging of the pelvis including uterus, ovaries, adnexal regions, and pelvic cul-de-sac.  It was necessary to proceed with endovaginal exam following the transabdominal exam to visualize the ovaries. Color and duplex Doppler ultrasound was utilized to evaluate blood flow to the ovaries.  COMPARISON:  None.  FINDINGS: Uterus  Measurements: 4.7 x 2.8 x 3.5 cm. Anterior uterine fibroid appears calcified measuring 7 x 2 x 8 mm. Within the posterior myometrium there is a calcified fibroid measuring 9 x 9 x 11 mm  Endometrium  Thickness: 2.3 mm.  No focal abnormality visualized.  Right ovary  Measurements: 1.4 x 0.7 x 0.7 cm. Normal appearance/no adnexal mass.  Left ovary  Measurements: 1.2 x 0.6 x 0.9 cm. Difficult to visualize due to overlying bowel gas and possibly stool.  Pulsed Doppler evaluation of both ovaries demonstrates normal low-resistance arterial and venous waveforms.  Other findings  No free fluid.  IMPRESSION: 1. No acute findings. 2. Small calcified uterine fibroids. 3. No evidence for ovarian torsion.  No adnexal mass is identified.   Electronically Signed   By: Kerby Moors M.D.   On: 02/17/2015 14:32   US Pelvis Complete  02/17/2015   CLINICAL DATA:  Left lower quadrant pain  EXAM: TRANSABDOMINAL AND TRANSVAGINAL ULTRASOUND OF PELVIS  DOPPLER ULTRASOUND OF OVARIES  TECHNIQUE: Both transabdominal and transvaginal ultrasound examinations of the pelvis were performed. Transabdominal technique was performed for global imaging  of the pelvis including uterus, ovaries, adnexal regions, and pelvic cul-de-sac.  It was necessary to proceed with endovaginal exam following the transabdominal exam to visualize the ovaries. Color and duplex Doppler ultrasound was utilized to evaluate blood flow to the ovaries.  COMPARISON:  None.  FINDINGS: Uterus  Measurements: 4.7 x 2.8 x 3.5 cm. Anterior uterine fibroid appears calcified measuring 7 x 2 x 8 mm. Within the posterior myometrium there is a calcified fibroid measuring 9 x 9 x 11 mm  Endometrium  Thickness: 2.3 mm.  No focal abnormality visualized.  Right ovary  Measurements: 1.4 x 0.7 x 0.7 cm. Normal appearance/no adnexal mass.  Left ovary  Measurements: 1.2 x 0.6 x 0.9 cm. Difficult to visualize due to overlying bowel gas and possibly stool.  Pulsed Doppler evaluation of both ovaries demonstrates normal low-resistance arterial and venous waveforms.  Other findings  No free fluid.  IMPRESSION: 1. No acute findings. 2. Small calcified uterine fibroids. 3. No evidence for ovarian torsion.  No adnexal mass is identified.   Electronically Signed   By: Kerby Moors M.D.   On: 02/17/2015 14:32   [place] Art/ven Flow Abd Pelv Doppler  02/17/2015   CLINICAL DATA:  Left lower quadrant pain  EXAM: TRANSABDOMINAL AND TRANSVAGINAL ULTRASOUND OF PELVIS  DOPPLER ULTRASOUND OF OVARIES  TECHNIQUE: Both transabdominal and transvaginal ultrasound examinations of the pelvis were performed. Transabdominal technique was performed for global imaging of the pelvis including uterus, ovaries, adnexal regions, and pelvic cul-de-sac.  It was necessary to proceed with endovaginal exam following the transabdominal exam to visualize the ovaries. Color and duplex Doppler ultrasound was utilized to evaluate blood flow to the  ovaries.  COMPARISON:  None.  FINDINGS: Uterus  Measurements: 4.7 x 2.8 x 3.5 cm. Anterior uterine fibroid appears calcified measuring 7 x 2 x 8 mm. Within the posterior myometrium there is a calcified  fibroid measuring 9 x 9 x 11 mm  Endometrium  Thickness: 2.3 mm.  No focal abnormality visualized.  Right ovary  Measurements: 1.4 x 0.7 x 0.7 cm. Normal appearance/no adnexal mass.  Left ovary  Measurements: 1.2 x 0.6 x 0.9 cm. Difficult to visualize due to overlying bowel gas and possibly stool.  Pulsed Doppler evaluation of both ovaries demonstrates normal low-resistance arterial and venous waveforms.  Other findings  No free fluid.  IMPRESSION: 1. No acute findings. 2. Small calcified uterine fibroids. 3. No evidence for ovarian torsion.  No adnexal mass is identified.   Electronically Signed   By: Kerby Moors M.D.   On: 02/17/2015 14:32    Review of Systems  Constitutional: Negative for fever and chills.  Gastrointestinal: Positive for abdominal pain (9/10; LLQ with radiation to her back.). Negative for nausea and vomiting.  Genitourinary: Positive for urgency and frequency. Negative for dysuria and hematuria.  Musculoskeletal: Back pain: Left lower back pain    Physical Exam   Blood pressure 149/83, pulse 82, temperature 97.9 F (36.6 C), temperature source Oral, resp. rate 16, height 5\' 5"  (1.651 m), weight 63.504 kg (140 lb), last menstrual period 10/02/1997, SpO2 100 %.  Physical Exam  Constitutional: She is oriented to person, place, and time. She appears well-developed and well-nourished. She appears distressed.  HENT:  Head: Normocephalic.  Eyes: Pupils are equal, round, and reactive to light.  Neck: Neck supple.  GI: Soft. Bowel sounds are normal. She exhibits no distension and no mass. There is no tenderness. There is no rebound, no guarding and no CVA tenderness.  Genitourinary:  Bimanual exam: Cervix closed Uterus non tender, normal size Adnexa non tender, no masses bilaterally Chaperone present for exam.  Neurological: She is alert and oriented to person, place, and time.  Skin: Skin is warm. She is not diaphoretic.  Psychiatric: Her behavior is normal.    MAU  Course  Procedures  None  MDM  Discussed patient with Dr. Quincy Simmonds proceed with pelvic US  Toradol 60 mg. CBC > normal   Patient denies pain following Toradol  1 fleets enema given in MAU with significant relief   Assessment and Plan   A:   1. Acute left lower quadrant pain      2. Constipation, unspecified constipation type   3. Yeast vaginitis     P:  Discharge home in stable condition Follow up with Dr. Sabra Heck; call to make an appointment Patient encouraged to use Fleets Enema at home as needed, as directed Ok to use metamucil as needed, as directed on the bottle Return to MAU if symptoms worsen    Lezlie Lye, NP 02/17/2015 2:28 PM

## 2015-02-18 LAB — URINE CULTURE
CULTURE: NO GROWTH
Colony Count: NO GROWTH

## 2015-02-18 NOTE — Telephone Encounter (Signed)
Patient went to [facility] and had a negative pelvic ultrasound.  She was noted to have a lot of stool in the intestine with her examination by the midlevel provider there.  Please call her to see if she is doing better.  I did already close this encounter.

## 2015-02-18 NOTE — Telephone Encounter (Addendum)
Spoke with patient. Patient states she is doing much better. "They told me it was likely a colon spasm. I feel a lot better." Advised patient I will let provider know and we are glad she is recovering and feeling better. Advised to give our office a call if she needs anything. Patient is agreeable.  Cc: Dr.Miller  Routing to provider for final review. Patient agreeable to disposition. Will close encounter.

## 2015-02-22 ENCOUNTER — Other Ambulatory Visit: Payer: Self-pay | Admitting: Nurse Practitioner

## 2015-02-22 ENCOUNTER — Telehealth: Payer: Self-pay | Admitting: Obstetrics and Gynecology

## 2015-02-22 ENCOUNTER — Ambulatory Visit
Admission: RE | Admit: 2015-02-22 | Discharge: 2015-02-22 | Disposition: A | Discharge status: Home / Self Care | Payer: Medicare Other | Source: Ambulatory Visit | Attending: Nurse Practitioner | Admitting: Nurse Practitioner

## 2015-02-22 ENCOUNTER — Encounter: Payer: Self-pay | Admitting: Nurse Practitioner

## 2015-02-22 ENCOUNTER — Telehealth: Payer: Self-pay | Admitting: Nurse Practitioner

## 2015-02-22 ENCOUNTER — Ambulatory Visit (INDEPENDENT_AMBULATORY_CARE_PROVIDER_SITE_OTHER): Payer: Medicare Other | Admitting: Nurse Practitioner

## 2015-02-22 VITALS — BP 134/92 | HR 72 | Temp 98.3°F | Ht 65.0 in | Wt 140.0 lb

## 2015-02-22 DIAGNOSIS — R300 Dysuria: Secondary | ICD-10-CM

## 2015-02-22 LAB — COMPREHENSIVE METABOLIC PANEL
ALBUMIN: 4.1 g/dL (ref 3.5–5.2)
ALT: 17 U/L (ref 0–35)
AST: 21 U/L (ref 0–37)
Alkaline Phosphatase: 85 U/L (ref 39–117)
BILIRUBIN TOTAL: 0.6 mg/dL (ref 0.2–1.2)
BUN: 21 mg/dL (ref 6–23)
CALCIUM: 9.4 mg/dL (ref 8.4–10.5)
CO2: 26 meq/L (ref 19–32)
Chloride: 105 mEq/L (ref 96–112)
Creat: 1.26 mg/dL — ABNORMAL HIGH (ref 0.50–1.10)
Glucose, Bld: 97 mg/dL (ref 70–99)
POTASSIUM: 4.8 meq/L (ref 3.5–5.3)
SODIUM: 140 meq/L (ref 135–145)
Total Protein: 7.3 g/dL (ref 6.0–8.3)

## 2015-02-22 LAB — POCT URINALYSIS DIPSTICK
Bilirubin, UA: NEGATIVE
Glucose, UA: NEGATIVE
KETONES UA: NEGATIVE
Leukocytes, UA: NEGATIVE
Nitrite, UA: NEGATIVE
PROTEIN UA: NEGATIVE
UROBILINOGEN UA: NEGATIVE
pH, UA: 6.5
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
## 2015-02-22 NOTE — Telephone Encounter (Signed)
Patient has been seen at [facility] today by Dr. Tresa Moore.   Message left on home number and husband, Dr. Annamaria Boots cell phone advised returning call, however office is now closed. Advised to call with any concerns.

## 2015-02-22 NOTE — Telephone Encounter (Signed)
Patient's spouse Dr Annamaria Boots called to let office know that he read her ct report and she should be seen by a urologist.

## 2015-02-22 NOTE — Progress Notes (Signed)
Reviewed personally.  M. Suzanne Dameer Speiser, MD.  

## 2015-02-22 NOTE — Progress Notes (Signed)
Schedule patient appointment for CT abdomen/pelvis w wo contrast at [facility] for today at 3:20pm. Patient is agreeable to date and time. Patient to have STAT CMP here in office before leaving. Patient aware will need to go to over to [place] to obtain contrast material and instructions. Aware should not eat past this time today. Okay for all liquids and normal daily medications. Order has been placed for precert before appointment. Placed in imaging hold.

## 2015-02-22 NOTE — Telephone Encounter (Signed)
Patients husband called MAU and specifically asked to speak to me. He states he is concerned because his wifes pain has returned.  I saw the patient 3 days ago in MAU and she was found to have large amounts of stool; she presented with left lower quadrant pain. She was feeling much better after a fleets enema and 60 mg of IM Toradol. Pelvic US was negative; although she had a small amount of hgb in her urine with a history of kidney stones. We were unable to officially rule in or out a kidney stone during her visit to MAU.  Dr. Annamaria Boots was concerned because his wife was feeling great up until today the pain started back and symptoms are similar to a UTI. He started her on Cipro and pyridium and wanted to know if there was any other bit of advice I could offer him. I encouraged him to call her Dr. ; Dr. Sabra Heck if symptoms were not 50% better by Monday. I also advised him to bring [facility] into MAU if symptoms worsened today. I advised him to try 1 dose of ibuprofen 600 mg PO and if that helped she would need to contact her cardiologist to confirm that taking ibuprofen is ok given her cardiac history. Dr. Annamaria Boots was very appreciative and was agreeable to this plan.

## 2015-02-22 NOTE — Patient Instructions (Signed)
Will  get CT scan and follow

## 2015-02-23 ENCOUNTER — Other Ambulatory Visit: Payer: Self-pay | Admitting: Urology

## 2015-02-24 NOTE — Telephone Encounter (Signed)
I called pt personally.  She did see Dr. Tammi Klippel on Monday.  She is on Flomax and pain medication and scheduled for lithotripsy on Friday if stone hasn't passed.  Appreciated phone call.  Encounter can be closed.

## 2015-03-04 ENCOUNTER — Encounter (HOSPITAL_COMMUNITY): Admission: RE | Payer: Self-pay | Source: Ambulatory Visit

## 2015-03-04 ENCOUNTER — Ambulatory Visit (HOSPITAL_COMMUNITY): Admission: RE | Admit: 2015-03-04 | Payer: Medicare Other | Source: Ambulatory Visit | Admitting: Urology

## 2015-03-04 SURGERY — LITHOTRIPSY, ESWL
Anesthesia: LOCAL | Laterality: Left

## 2015-05-10 ENCOUNTER — Other Ambulatory Visit: Payer: Self-pay | Admitting: *Deleted

## 2015-05-10 MED ORDER — DILTIAZEM HCL ER COATED BEADS 120 MG PO CP24: ORAL_CAPSULE | ORAL | Status: DC

## 2015-07-17 ENCOUNTER — Encounter (HOSPITAL_COMMUNITY): Payer: Self-pay | Admitting: Emergency Medicine

## 2015-07-17 ENCOUNTER — Emergency Department (HOSPITAL_COMMUNITY)
Admission: EM | Admit: 2015-07-17 | Discharge: 2015-07-17 | Disposition: A | Discharge status: Home / Self Care | Payer: Medicare Other | Attending: Emergency Medicine | Admitting: Emergency Medicine

## 2015-07-17 ENCOUNTER — Emergency Department (HOSPITAL_COMMUNITY): Payer: Medicare Other

## 2015-07-17 DIAGNOSIS — N189 Chronic kidney disease, unspecified: Secondary | ICD-10-CM | POA: Diagnosis not present

## 2015-07-17 DIAGNOSIS — E785 Hyperlipidemia, unspecified: Secondary | ICD-10-CM | POA: Insufficient documentation

## 2015-07-17 DIAGNOSIS — Z79899 Other long term (current) drug therapy: Secondary | ICD-10-CM | POA: Insufficient documentation

## 2015-07-17 DIAGNOSIS — I480 Paroxysmal atrial fibrillation: Secondary | ICD-10-CM | POA: Diagnosis not present

## 2015-07-17 DIAGNOSIS — Z7982 Long term (current) use of aspirin: Secondary | ICD-10-CM | POA: Insufficient documentation

## 2015-07-17 DIAGNOSIS — R002 Palpitations: Secondary | ICD-10-CM | POA: Diagnosis present

## 2015-07-17 LAB — CBC
HEMATOCRIT: 42.8 % (ref 36.0–46.0)
HEMOGLOBIN: 14.4 g/dL (ref 12.0–15.0)
MCH: 29.8 pg (ref 26.0–34.0)
MCHC: 33.6 g/dL (ref 30.0–36.0)
MCV: 88.6 fL (ref 78.0–100.0)
Platelets: 200 10*3/uL (ref 150–400)
RBC: 4.83 MIL/uL (ref 3.87–5.11)
RDW: 12.3 % (ref 11.5–15.5)
WBC: 6.6 10*3/uL (ref 4.0–10.5)

## 2015-07-17 LAB — BASIC METABOLIC PANEL
ANION GAP: 8 (ref 5–15)
BUN: 15 mg/dL (ref 6–20)
CHLORIDE: 103 mmol/L (ref 101–111)
CO2: 26 mmol/L (ref 22–32)
Calcium: 9 mg/dL (ref 8.9–10.3)
Creatinine, Ser: 0.83 mg/dL (ref 0.44–1.00)
GFR calc Af Amer: >60 mL/min (ref >60)
GLUCOSE: 115 mg/dL — AB (ref 65–99)
POTASSIUM: 3.9 mmol/L (ref 3.5–5.1)
Sodium: 137 mmol/L (ref 135–145)

## 2015-07-17 LAB — TROPONIN I: Troponin I: <0.03 ng/mL (ref <0.031)

## 2015-07-17 MED ORDER — APIXABAN 5 MG PO TABS: 5.0000 mg | ORAL_TABLET | Freq: Two times a day (BID) | ORAL | Status: DC

## 2015-07-17 MED ORDER — DILTIAZEM HCL 100 MG IV SOLR
5.0000 mg/h | Freq: Once | INTRAVENOUS | Status: AC
  Administered 2015-07-17: 5 mg/h via INTRAVENOUS
  Filled 2015-07-17: qty 100

## 2015-07-17 MED ORDER — DILTIAZEM HCL 100 MG IV SOLR: 5.0000 mg/h | Freq: Once | INTRAVENOUS | Status: DC

## 2015-07-17 NOTE — Discharge Instructions (Signed)
Begin taking Eliquis 5 mg twice daily.  Call the cardiology clinic on Monday to arrange a follow-up appointment. The contact information for the cardiology office has been provided in this discharge summary for you to call to arrange this.  Return to the ER if symptoms significantly worsen or change.   Atrial Fibrillation Atrial fibrillation is a type of irregular or rapid heartbeat (arrhythmia). In atrial fibrillation, the heart quivers continuously in a chaotic pattern. This occurs when parts of the heart receive disorganized signals that make the heart unable to pump blood normally. This can increase the risk for stroke, heart failure, and other heart-related conditions. There are different types of atrial fibrillation, including:  Paroxysmal atrial fibrillation. This type starts suddenly, and it usually stops on its own shortly after it starts.  Persistent atrial fibrillation. This type often lasts longer than a week. It may stop on its own or with treatment.  Long-lasting persistent atrial fibrillation. This type lasts longer than 12 months.  Permanent atrial fibrillation. This type does not go away. Talk with your health care provider to learn about the type of atrial fibrillation that you have. CAUSES This condition is caused by some heart-related conditions or procedures, including:  A heart attack.  Coronary artery disease.  Heart failure.  Heart valve conditions.  High blood pressure.  Inflammation of the sac that surrounds the heart (pericarditis).  Heart surgery.  Certain heart rhythm disorders, such as Wolf-Parkinson-White syndrome. Other causes include:  Pneumonia.  Obstructive sleep apnea.  Blockage of an artery in the lungs (pulmonary embolism, or PE).  Lung cancer.  Chronic lung disease.  Thyroid problems, especially if the thyroid is overactive (hyperthyroidism).  Caffeine.  Excessive alcohol use or illegal drug use.  Use of some medicines,  including certain decongestants and diet pills. Sometimes, the cause cannot be found. RISK FACTORS This condition is more likely to develop in:  People who are older in age.  People who smoke.  People who have diabetes mellitus.  People who are overweight (obese).  Athletes who exercise vigorously. SYMPTOMS Symptoms of this condition include:  A feeling that your heart is beating rapidly or irregularly.  A feeling of discomfort or pain in your chest.  Shortness of breath.  Sudden light-headedness or weakness.  Getting tired easily during exercise. In some cases, there are no symptoms. DIAGNOSIS Your health care provider may be able to detect atrial fibrillation when taking your pulse. If detected, this condition may be diagnosed with:  An electrocardiogram (ECG).  A Holter monitor test that records your heartbeat patterns over a 24-hour period.  Transthoracic echocardiogram (TTE) to evaluate how blood flows through your heart.  Transesophageal echocardiogram (TEE) to view more detailed images of your heart.  A stress test.  Imaging tests, such as a CT scan or chest X-ray.  Blood tests. TREATMENT The main goals of treatment are to prevent blood clots from forming and to keep your heart beating at a normal rate and rhythm. The type of treatment that you receive depends on many factors, such as your underlying medical conditions and how you feel when you are experiencing atrial fibrillation. This condition may be treated with:  Medicine to slow down the heart rate, bring the heart's rhythm back to normal, or prevent clots from forming.  Electrical cardioversion. This is a procedure that resets your heart's rhythm by delivering a controlled, low-energy shock to the heart through your skin.  Different types of ablation, such as catheter ablation, catheter ablation with  pacemaker, or surgical ablation. These procedures destroy the heart tissues that send abnormal signals.  When the pacemaker is used, it is placed under your skin to help your heart beat in a regular rhythm. HOME CARE INSTRUCTIONS  Take over-the counter and prescription medicines only as told by your health care provider.  If your health care provider prescribed a blood-thinning medicine (anticoagulant), take it exactly as told. Taking too much blood-thinning medicine can cause bleeding. If you do not take enough blood-thinning medicine, you will not have the protection that you need against stroke and other problems.  Do not use tobacco products, including cigarettes, chewing tobacco, and e-cigarettes. If you need help quitting, ask your health care provider.  If you have obstructive sleep apnea, manage your condition as told by your health care provider.  Do not drink alcohol.  Do not drink beverages that contain caffeine, such as coffee, soda, and tea.  Maintain a healthy weight. Do not use diet pills unless your health care provider approves. Diet pills may make heart problems worse.  Follow diet instructions as told by your health care provider.  Exercise regularly as told by your health care provider.  Keep all follow-up visits as told by your health care provider. This is important. PREVENTION  Avoid drinking beverages that contain caffeine or alcohol.  Avoid certain medicines, especially medicines that are used for breathing problems.  Avoid certain herbs and herbal medicines, such as those that contain ephedra or ginseng.  Do not use illegal drugs, such as cocaine and amphetamines.  Do not smoke.  Manage your high blood pressure. SEEK MEDICAL CARE IF:  You notice a change in the rate, rhythm, or strength of your heartbeat.  You are taking an anticoagulant and you notice increased bruising.  You tire more easily when you exercise or exert yourself. SEEK IMMEDIATE MEDICAL CARE IF:  You have chest pain, abdominal pain, sweating, or weakness.  You feel nauseous.  You  notice blood in your vomit, bowel movement, or urine.  You have shortness of breath.  You suddenly have swollen feet and ankles.  You feel dizzy.  You have sudden weakness or numbness of the face, arm, or leg, especially on one side of the body.  You have trouble speaking, trouble understanding, or both (aphasia).  Your face or your eyelid droops on one side. These symptoms may represent a serious problem that is an emergency. Do not wait to see if the symptoms will go away. Get medical help right away. Call your local emergency services (911 in the [place] Do not drive yourself to the hospital.   This information is not intended to replace advice given to you by your health care provider. Make sure you discuss any questions you have with your health care provider.   Document Released: 09/18/2005 Document Revised: 06/09/2015 Document Reviewed: 01/13/2015 Elsevier Interactive Patient Education [employer].

## 2015-07-17 NOTE — ED Notes (Signed)
Pt arrives from home via [facility] c/o symptomatic afib beginning around 0400. Pt reports near syncope, "woozy".  Pt's spouse reports nystagmus. Pt reports hx afib controlled for past 10 years with cardizem. Pt took cardizem and flecinide without relief.  EMS reports giving 10mg  cardizem bolus.  EMS reports initial HR 130's, down to 100's after bolus.  Pt AOx4, NAD noted at this time.

## 2015-07-17 NOTE — ED Notes (Signed)
Pt called out to say heart "feels like it's definitely beating harder".  EKG done.

## 2015-07-17 NOTE — ED Notes (Signed)
MD at bedside. 

## 2015-07-17 NOTE — ED Provider Notes (Signed)
CSN: [id number]     Arrival date & time 07/17/15  7001 History   First MD Initiated Contact with Patient 07/17/15 [phone]     Chief Complaint  Patient presents with  . Atrial Fibrillation     (Consider location/radiation/quality/duration/timing/severity/associated sxs/prior Treatment) HPI Comments: Patient is a 74 year old female with history of paroxysmal atrial fibrillation, mitral valve prolapse. She presents for evaluation of palpitations that started approximately 4 AM. She states she got up to the bathroom and felt her heart racing. She took her morning medications of Cardizem and flecainide, however her symptoms have persisted. She was brought here by EMS and received IV Cardizem in route. She denies any chest pain or shortness of breath. She denies any leg pain or swelling.  Patient is a 74 y.o. female presenting with atrial fibrillation. The history is provided by the patient.  Atrial Fibrillation This is a new problem. Episode onset: 4 AM. The problem occurs constantly. The problem has not changed since onset.Pertinent negatives include no chest pain, no abdominal pain and no shortness of breath. Nothing aggravates the symptoms. Nothing relieves the symptoms. She has tried nothing for the symptoms. The treatment provided no relief.    Past Medical History  Diagnosis Date  . Hyperlipidemia   . Drug therapy     Flecainide therapy started March, 2011, treadmill March, 2011, no exercise-induced ectopy  . Atrial fibrillation [facility])     Very rare episodes  / episode March, 2011  CHADS score O  . Ejection fraction     EF 55-65%, echo, October, 2009  . Mitral regurgitation     Mild, echo, October, 2009  . MVP (mitral valve prolapse)     per pt "mild"  . Chronic kidney disease     hx of kidney stones  . Abnormal Pap smear    Past Surgical History  Procedure Laterality Date  . Tonsillectomy and adenoidectomy    . Varicose vein ablation      left  . Colonoscopy    . Dilatation &  currettage/hysteroscopy with resectocope  08/20/2012    Procedure: [place];  Surgeon: Lyman Speller, MD;  Location: [facility];  Service: Gynecology;  Laterality: N/A;  . Gynecologic cryosurgery  1980's  . Hysteroscopy  11/3    polyp resection   Family History  Problem Relation Age of Onset  . Parkinsonism Father   . Colon cancer Neg Hx   . Esophageal cancer Neg Hx   . Rectal cancer Neg Hx   . Stomach cancer Neg Hx    Social History  Substance Use Topics  . Smoking status: Never Smoker   . Smokeless tobacco: Never Used  . Alcohol Use: 1.2 - 1.8 oz/week    1-2 Standard drinks or equivalent, 1 Glasses of wine per week     Comment: socially   OB History    Gravida Para Term Preterm AB TAB SAB Ectopic Multiple Living   2 2 2       2      Review of Systems  Respiratory: Negative for shortness of breath.   Cardiovascular: Negative for chest pain.  Gastrointestinal: Negative for abdominal pain.  All other systems reviewed and are negative.     Allergies  Tetanus toxoid  Home Medications   Prior to Admission medications   Medication Sig Start Date End Date Taking? Authorizing Provider  acetaminophen (TYLENOL) 500 MG tablet Take 500-1,000 mg by mouth every 6 (six) hours as needed for moderate pain or  headache.   Yes Historical Provider, MD  aspirin 81 MG tablet Take 81 mg by mouth every other day. In PM   Yes Historical Provider, MD  B Complex Vitamins (VITAMIN B COMPLEX) TABS Take 1 tablet by mouth at bedtime.   Yes Historical Provider, MD  diltiazem (CARDIZEM CD) 120 MG 24 hr capsule TAKE 1 CAPSULE BY MOUTH ONCE A DAY 05/10/15  Yes Carlena Bjornstad, MD  estradiol (ESTRING) 2 MG vaginal ring Place 2 mg vaginally every 3 (three) months. Wears for 3 months Patient taking differently: Place 2 mg vaginally every 3 (three) months. Wears for 3 months - 4 months.  Today 02/17/15, pt removed the ring.. 09/18/14  Yes Megan Salon, MD  fish  oil-omega-3 fatty acids 1000 MG capsule Take 1 g by mouth at bedtime.    Yes Historical Provider, MD  flecainide (TAMBOCOR) 50 MG tablet Take 1 tablet (50 mg total) by mouth every 12 (twelve) hours. 02/12/15  Yes Carlena Bjornstad, MD  ibuprofen (ADVIL,MOTRIN) 200 MG tablet Take 600 mg by mouth every 4 (four) hours as needed for headache or moderate pain.   Yes Historical Provider, MD  Magnesium 500 MG TABS Take 1 tablet by mouth every other day. In PM   Yes Historical Provider, MD  Multiple Vitamin (MULTIVITAMIN) capsule Take 1 capsule by mouth at bedtime.    Yes Historical Provider, MD  simvastatin (ZOCOR) 80 MG tablet Take 40 mg by mouth daily at 6 PM.  09/08/14  Yes Historical Provider, MD  Vitamin D, Ergocalciferol, (DRISDOL) 50000 UNITS CAPS capsule Take 1 capsule (50,000 Units total) by mouth every 7 (seven) days. Patient taking differently: Take 50,000 Units by mouth every 7 (seven) days. Sunday. 09/18/14  Yes Megan Salon, MD   BP 106/83 mmHg  Pulse 127  Resp 20  Ht 5\' 6"  (1.676 m)  Wt 140 lb (63.504 kg)  BMI 22.61 kg/m2  SpO2 98%  LMP 10/02/1997 Physical Exam  Constitutional: She is oriented to person, place, and time. She appears well-developed and well-nourished. No distress.  HENT:  Head: Normocephalic and atraumatic.  Neck: Normal range of motion. Neck supple.  Cardiovascular: Normal rate.  Exam reveals no gallop and no friction rub.   No murmur heard. Heart is irregularly irregular and rapid.  Pulmonary/Chest: Effort normal and breath sounds normal. No respiratory distress. She has no wheezes.  Abdominal: Soft. Bowel sounds are normal. She exhibits no distension. There is no tenderness.  Musculoskeletal: Normal range of motion. She exhibits no edema.  Neurological: She is alert and oriented to person, place, and time.  Skin: Skin is warm and dry. She is not diaphoretic.  Nursing note and vitals reviewed.   ED Course  Procedures (including critical care time) Labs  Review Labs Reviewed  BASIC METABOLIC PANEL  CBC    Imaging Review No results found. I have personally reviewed and evaluated these images and lab results as part of my medical decision-making.   EKG Interpretation   Date/Time:  Saturday July 17 2015 09:31:27 EDT Ventricular Rate:  132 PR Interval:    QRS Duration: 88 QT Interval:  341 QTC Calculation: 505 R Axis:   71 Text Interpretation:  Atrial fibrillation Borderline repolarization  abnormality Prolonged QT interval Confirmed by Jilberto Vanderwall  MD, Marchele Decock ([id number])  on 07/17/2015 9:35:14 AM      MDM   Final diagnoses:  None    Patient is a 74 year old female history of paroxysmal A. fib, however has not had  an episode in approximately 9 years. She woke this morning with palpitations at 4 AM and EKG confirms atrial fibrillation. She was given Cardizem and has since converted back to a sinus rhythm. Her workup reveals no significant abnormalities otherwise. She denies any chest pain.  I've discussed this with the cardiologist on call, Dr. Curt Bears he does not feel as though any acute intervention is indicated. He does recommend starting Eliquis. She will be discharged to home. As her cardiologist has since retired, she should call the cardiology clinic on Monday to arrange a follow-up appointment and establish care.    Veryl Speak, MD 07/17/15 [phone]

## 2015-07-26 ENCOUNTER — Ambulatory Visit (INDEPENDENT_AMBULATORY_CARE_PROVIDER_SITE_OTHER): Payer: Medicare Other | Admitting: Cardiology

## 2015-07-26 ENCOUNTER — Encounter: Payer: Self-pay | Admitting: Cardiology

## 2015-07-26 VITALS — BP 120/70 | HR 85 | Ht 66.0 in | Wt 142.8 lb

## 2015-07-26 DIAGNOSIS — R0600 Dyspnea, unspecified: Secondary | ICD-10-CM

## 2015-07-26 DIAGNOSIS — I4891 Unspecified atrial fibrillation: Secondary | ICD-10-CM

## 2015-07-26 LAB — TSH: TSH: 1.709 u[IU]/mL (ref 0.350–4.500)

## 2015-07-26 MED ORDER — DILTIAZEM HCL ER COATED BEADS 120 MG PO CP24: ORAL_CAPSULE | ORAL | Status: DC

## 2015-07-26 MED ORDER — FLECAINIDE ACETATE 50 MG PO TABS: 50.0000 mg | ORAL_TABLET | Freq: Two times a day (BID) | ORAL | Status: DC

## 2015-07-26 MED ORDER — APIXABAN 5 MG PO TABS: 5.0000 mg | ORAL_TABLET | Freq: Two times a day (BID) | ORAL | Status: DC

## 2015-07-26 NOTE — Patient Instructions (Signed)
Medication Instructions:  Your physician recommends that you continue on your current medications as directed. Please refer to the Current Medication list given to you today.  Labwork: Your physician recommends that you have lab work today. TSH  Testing/Procedures: Your physician has requested that you have an echocardiogram. Echocardiography is a painless test that uses sound waves to create images of your heart. It provides your doctor with information about the size and shape of your heart and how well your heart's chambers and valves are working. This procedure takes approximately one hour. There are no restrictions for this procedure.  Follow-Up: Your physician wants you to follow-up in: 6 months with Dr. Acie Fredrickson. You will receive a reminder letter in the mail two months in advance. If you don't receive a letter, please call our office to schedule the follow-up appointment.

## 2015-07-26 NOTE — Progress Notes (Signed)
07/26/2015 [name]   04/23/1941  [id number]  Primary Physician Jerlyn Ly, MD Primary Cardiologist: Dr. Ron Parker   Reason for Visit/CC: Post ED F/U for Atrial Fibrillation  HPI:  The patient is a 74 year old female, previously followed by Dr. Ron Parker. Her cardiac history is significant for paroxysmal atrial fibrillation which has, in the past, been fairly well-controlled on low-dose flecainide for many years. She also has a history of mitral valve disease. Her last echocardiogram was 08/28/2011. Left ventricular ejection fraction was normal at 55-65%. Grade 2 diastolic dysfunction was noted. There were no regional wall motion abnormalities. Mild mitral regurgitation was noted. Her other medical history significant for DLD. She is followed medically by Dr. Joylene Draft.  She presents to clinic today for post ED follow-up. She was evaluated at the [facility] emergency department on 07/17/2015 with a complaint of tachypalpitations and syncope x 1. EKG revealed atrial fibrillation. CXR unremarkable.  CBC was unremarkable. She had no anemia. Hemoglobin was 14.4. White count was also within normal limits at 6.6. Basic metabolic panel was also unremarkable. Potassium was normal at 3.9. Renal indices did not suggest dehydration. Troponin level was also normal. It does not appear that TSH was assessed. Per records, she was given IV Cardizem and converted back to sinus rhythm. Given her CHA2DS2 VASc score of at least 2, for  female sex and age 48-74, she was started on Eliquis, 5 mg BID for stroke prophylaxis. She was instructed to follow-up in our office for further evaluation.  In retrospect, the patient also taking melatonin the night of her symptoms for sleep. This was a new medication for her and she feels that this may have likely triggered her arrhythmia. She also recalls having one drink of alcohol + chocolate the night prior doing a game of bridge with her friends. She denies any further recurrence  since being released from the hospital. She has since avoided any potential triggers. She has been fully compliant with flecainide, Cardizem and Eliquis. She is tolerating Eliquis without any side effects. She denies any abnormal bleeding and no falls. Denies any further syncope.     Current Outpatient Prescriptions  Medication Sig Dispense Refill  . acetaminophen (TYLENOL) 500 MG tablet Take 500-1,000 mg by mouth every 6 (six) hours as needed for moderate pain or headache.    Marland Kitchen apixaban (ELIQUIS) 5 MG TABS tablet Take 1 tablet (5 mg total) by mouth 2 (two) times daily. 60 tablet 0  . B Complex Vitamins (VITAMIN B COMPLEX) TABS Take 1 tablet by mouth at bedtime.    Marland Kitchen diltiazem (CARDIZEM CD) 120 MG 24 hr capsule TAKE 1 CAPSULE BY MOUTH ONCE A DAY 90 capsule 1  . estradiol (ESTRING) 2 MG vaginal ring Place 2 mg vaginally every 3 (three) months. Wears for 3 months (Patient taking differently: Place 2 mg vaginally every 3 (three) months. Wears for 3 months - 4 months.  Today 02/17/15, pt removed the ring.Marland Kitchen) 1 each 4  . fish oil-omega-3 fatty acids 1000 MG capsule Take 1 g by mouth at bedtime.     . flecainide (TAMBOCOR) 50 MG tablet Take 1 tablet (50 mg total) by mouth every 12 (twelve) hours. 180 tablet 2  . ibuprofen (ADVIL,MOTRIN) 200 MG tablet Take 600 mg by mouth every 4 (four) hours as needed for headache or moderate pain.    . Magnesium 500 MG TABS Take 1 tablet by mouth every other day. In PM    . Multiple Vitamin (MULTIVITAMIN) capsule Take  1 capsule by mouth at bedtime.     . simvastatin (ZOCOR) 80 MG tablet Take 40 mg by mouth daily at 6 PM.   11  . Vitamin D, Ergocalciferol, (DRISDOL) 50000 UNITS CAPS capsule Take 1 capsule (50,000 Units total) by mouth every 7 (seven) days. (Patient taking differently: Take 50,000 Units by mouth every 7 (seven) days. Sunday.) 30 capsule 3   No current facility-administered medications for this visit.    Allergies  Allergen Reactions  . Tetanus Toxoid      Fever, swelling at site    Social History   Social History  . Marital Status: Married    Spouse Name: N/A  . Number of Children: N/A  . Years of Education: N/A   Occupational History  . Not on file.   Social History Main Topics  . Smoking status: Never Smoker   . Smokeless tobacco: Never Used  . Alcohol Use: 1.2 - 1.8 oz/week    1-2 Standard drinks or equivalent, 1 Glasses of wine per week     Comment: socially  . Drug Use: No  . Sexual Activity:    Partners: Male    Birth Control/ Protection: Post-menopausal   Other Topics Concern  . Not on file   Social History Narrative     Review of Systems: General: negative for chills, fever, night sweats or weight changes.  Cardiovascular: negative for chest pain, dyspnea on exertion, edema, orthopnea, palpitations, paroxysmal nocturnal dyspnea or shortness of breath Dermatological: negative for rash Respiratory: negative for cough or wheezing Urologic: negative for hematuria Abdominal: negative for nausea, vomiting, diarrhea, bright red blood per rectum, melena, or hematemesis Neurologic: negative for visual changes, syncope, or dizziness All other systems reviewed and are otherwise negative except as noted above.    Blood pressure 120/70, pulse 85, height 5\' 6"  (1.676 m), weight 142 lb 12.8 oz (64.774 kg), last menstrual period 10/02/1997.  General appearance: alert, cooperative and no distress Neck: no adenopathy, no carotid bruit and no JVD Lungs: clear to auscultation bilaterally Heart: regular rate and rhythm, S1, S2 normal, no murmur, click, rub or gallop Extremities: no LEE Pulses: 2+ and symmetric Skin: warm and dry Neurologic: Grossly normal  EKG NST. 71 bpm   ASSESSMENT AND PLAN:   1. PAF: EKG today demonstrates sinus rhythm. Heart rate is well controlled at 71 bpm. Since being released from the emergency department, she denies any further recurrence of breakthrough atrial fibrillation. She has been  fully compliant with flecainide and Cardizem. She has continued to avoid potential triggers. She has also been fully compliant with Eliquis and denies any abnormal bleeding. Given her risk of stroke and elevated CHA2DS2 VASc score of at least 2, I recommended that she continue 5 mg of Eliquis twice a day for stroke prophylaxis. Her recent laboratory work in the [facility] emergency department was unremarkable however thyroid function was not assessed. We will check a TSH today to assess thyroid function. Given that she also had syncope during this episode, we will check a 2-D echocardiogram. Her syncope was likely just in setting of tachycardia and possible related hypotension, however we will obtain the echo to reassess LV function, her mitral valve as well as her other cardiac valves and wall motion.   2. DLD: followed by her PCP, Dr. Joylene Draft   PLAN  Continue current medical therapy for her paroxsysmal atrial fibrillation/stroke prophylaxis. We'll obtain a TSH as well as a 2-D echocardiogram given her recent recurrence of A. fib. Per  Dr. Ron Parker, she is to continue routine cardiovascular care with Dr. Acie Fredrickson. I've recommended that she return in 4-6 months to see him.   Emmons Toth PA-C 07/26/2015 8:02 AM

## 2015-08-03 ENCOUNTER — Other Ambulatory Visit: Payer: Self-pay

## 2015-08-03 ENCOUNTER — Ambulatory Visit (HOSPITAL_COMMUNITY): Payer: Medicare Other | Attending: Cardiovascular Disease

## 2015-08-03 DIAGNOSIS — R0600 Dyspnea, unspecified: Secondary | ICD-10-CM | POA: Diagnosis not present

## 2015-08-03 DIAGNOSIS — I4891 Unspecified atrial fibrillation: Secondary | ICD-10-CM | POA: Diagnosis not present

## 2015-08-03 DIAGNOSIS — I517 Cardiomegaly: Secondary | ICD-10-CM | POA: Insufficient documentation

## 2015-08-03 DIAGNOSIS — I340 Nonrheumatic mitral (valve) insufficiency: Secondary | ICD-10-CM | POA: Diagnosis not present

## 2015-08-03 DIAGNOSIS — E785 Hyperlipidemia, unspecified: Secondary | ICD-10-CM | POA: Insufficient documentation

## 2015-08-11 ENCOUNTER — Encounter: Payer: Self-pay | Admitting: Gastroenterology

## 2015-10-03 DIAGNOSIS — C801 Malignant (primary) neoplasm, unspecified: Secondary | ICD-10-CM

## 2015-10-03 HISTORY — DX: Malignant (primary) neoplasm, unspecified: C80.1

## 2015-10-08 ENCOUNTER — Other Ambulatory Visit: Payer: Self-pay | Admitting: *Deleted

## 2015-10-08 ENCOUNTER — Other Ambulatory Visit: Payer: Self-pay | Admitting: Obstetrics & Gynecology

## 2015-10-08 NOTE — Telephone Encounter (Signed)
Medication refill request: Vit D Last AEX:  09/18/14 SM Next AEX: 11/19/15 SM Last MMG (if hormonal medication request): 10/19/14 [id number]:neg Refill authorized: 09/18/14 #[id number]. Today please advise.

## 2015-10-08 NOTE — Telephone Encounter (Signed)
diltiazem (CARDIZEM CD) 120 MG 24 hr capsule  Medication   Date: 07/26/2015  Department: [address] [facility]  Ordering/Authorizing: Consuelo Pandy, PA-C      Order Providers    Prescribing Provider Encounter Provider   Brittainy Erie Noe, PA-C Brittainy Erie Noe, PA-C    Supervision Information    Supervising Provider Type of Supervision   Lorretta Harp, MD Supervision Required    Medication Detail      Disp Refills Start End     diltiazem (CARDIZEM CD) 120 MG 24 hr capsule 90 capsule 3 07/26/2015     Sig: TAKE 1 CAPSULE BY MOUTH ONCE A DAY    E-Prescribing Status: Receipt confirmed by pharmacy (07/26/2015 8:53 AM EDT)     Pharmacy    [id number] - [place], [place] [address] [place]   flecainide (TAMBOCOR) 50 MG tablet  Medication   Date: 07/26/2015  Department: [address] [facility]  Ordering/Authorizing: Consuelo Pandy, PA-C      Order Providers    Prescribing Provider Encounter Provider   [place], PA-C Brittainy Erie Noe, PA-C    Supervision Information    Supervising Provider Type of Supervision   Lorretta Harp, MD Supervision Required    Medication Detail      Disp Refills Start End     flecainide (TAMBOCOR) 50 MG tablet 180 tablet 3 07/26/2015     Sig - Route: Take 1 tablet (50 mg total) by mouth every 12 (twelve) hours. - Oral    E-Prescribing Status: Receipt confirmed by pharmacy (07/26/2015 8:53 AM EDT)     Pharmacy    [id number] - [place], [place]

## 2015-10-13 ENCOUNTER — Other Ambulatory Visit: Payer: Self-pay | Admitting: *Deleted

## 2015-10-13 MED ORDER — DILTIAZEM HCL ER COATED BEADS 120 MG PO CP24: ORAL_CAPSULE | ORAL | Status: DC

## 2015-10-13 MED ORDER — FLECAINIDE ACETATE 50 MG PO TABS: 50.0000 mg | ORAL_TABLET | Freq: Two times a day (BID) | ORAL | Status: DC

## 2015-10-18 ENCOUNTER — Other Ambulatory Visit: Payer: Self-pay | Admitting: Obstetrics & Gynecology

## 2015-11-08 ENCOUNTER — Other Ambulatory Visit: Payer: Self-pay

## 2015-11-08 DIAGNOSIS — Z1231 Encounter for screening mammogram for malignant neoplasm of breast: Secondary | ICD-10-CM

## 2015-11-19 ENCOUNTER — Encounter: Payer: Self-pay | Admitting: Obstetrics & Gynecology

## 2015-11-19 ENCOUNTER — Ambulatory Visit (INDEPENDENT_AMBULATORY_CARE_PROVIDER_SITE_OTHER): Payer: Medicare Other | Admitting: Obstetrics & Gynecology

## 2015-11-19 VITALS — BP 136/80 | HR 72 | Resp 16 | Ht 65.25 in | Wt 143.0 lb

## 2015-11-19 DIAGNOSIS — Z01419 Encounter for gynecological examination (general) (routine) without abnormal findings: Secondary | ICD-10-CM | POA: Diagnosis not present

## 2015-11-19 DIAGNOSIS — Z124 Encounter for screening for malignant neoplasm of cervix: Secondary | ICD-10-CM | POA: Diagnosis not present

## 2015-11-19 MED ORDER — ESTRADIOL 2 MG VA RING: 2.0000 mg | VAGINAL_RING | VAGINAL | Status: DC

## 2015-11-19 MED ORDER — VITAMIN D (ERGOCALCIFEROL) 1.25 MG (50000 UNIT) PO CAPS: ORAL_CAPSULE | ORAL | Status: DC

## 2015-11-19 NOTE — Progress Notes (Signed)
75 y.o. [id number] MarriedCaucasianF here for annual exam.  Doing well.  Family is doing well.  No vaginal bleeding.     Patient's last menstrual period was 10/02/1997.          Sexually active: Yes.    The current method of family planning is post menopausal status.    Exercising: Yes.    Pilates, zumba Smoker:  no  Health Maintenance: Pap:  09/18/14 Neg History of abnormal Pap:  yes MMG: 10/19/14 BIRADS1:neg.  Scheduled 11/25/15 Colonoscopy:  08/05/12 Normal - repeat 10 years  BMD:   10/13/13 Osteopenia, -1.0/-1.3 TDaP:  UTD w/ PCP Screening Labs: PCP, Hb today: PCP, Urine today: PCP   reports that she has never smoked. She has never used smokeless tobacco. She reports that she drinks about 1.2 - 1.8 oz of alcohol per week. She reports that she does not use illicit drugs.  Past Medical History  Diagnosis Date  . Hyperlipidemia   . Drug therapy     Flecainide therapy started March, 2011, treadmill March, 2011, no exercise-induced ectopy  . Atrial fibrillation [facility])     Very rare episodes  / episode March, 2011  CHADS score O  . Ejection fraction     EF 55-65%, echo, October, 2009  . Mitral regurgitation     Mild, echo, October, 2009  . MVP (mitral valve prolapse)     per pt "mild"  . Chronic kidney disease     hx of kidney stones  . Abnormal Pap smear   . Kidney stone     Past Surgical History  Procedure Laterality Date  . Tonsillectomy and adenoidectomy    . Varicose vein ablation      left  . Colonoscopy    . Dilatation & currettage/hysteroscopy with resectocope  08/20/2012    Procedure: [place];  Surgeon: Lyman Speller, MD;  Location: [facility];  Service: Gynecology;  Laterality: N/A;  . Gynecologic cryosurgery  1980's  . Hysteroscopy  11/3    polyp resection    Current Outpatient Prescriptions  Medication Sig Dispense Refill  . acetaminophen (TYLENOL) 500 MG tablet Take 500-1,000 mg by mouth every 6 (six) hours as  needed for moderate pain or headache.    Marland Kitchen apixaban (ELIQUIS) 5 MG TABS tablet Take 1 tablet (5 mg total) by mouth 2 (two) times daily. 60 tablet 11  . B Complex Vitamins (VITAMIN B COMPLEX) TABS Take 1 tablet by mouth at bedtime.    Marland Kitchen diltiazem (CARDIZEM CD) 120 MG 24 hr capsule TAKE 1 CAPSULE BY MOUTH ONCE A DAY 90 capsule 3  . fish oil-omega-3 fatty acids 1000 MG capsule Take 1 g by mouth at bedtime.     . flecainide (TAMBOCOR) 50 MG tablet Take 1 tablet (50 mg total) by mouth every 12 (twelve) hours. 180 tablet 3  . ibuprofen (ADVIL,MOTRIN) 200 MG tablet Take 600 mg by mouth every 4 (four) hours as needed for headache or moderate pain.    . Magnesium 500 MG TABS Take 1 tablet by mouth every other day. In PM    . Multiple Vitamin (MULTIVITAMIN) capsule Take 1 capsule by mouth at bedtime.     Marland Kitchen PREVIDENT 5000 BOOSTER PLUS 1.1 % PSTE APPLY PEA SIZE AMOUNT ONTO BRUSH AND APPLY TO ALL TEETH SURFACE X2 MINS ONCE DAILY AND EXPECTORATE  4  . simvastatin (ZOCOR) 40 MG tablet Take 40 mg by mouth daily.    . Vitamin D, Ergocalciferol, (DRISDOL) 50000 units  CAPS capsule TAKE ONE CAPSULE BY MOUTH EVERY 7 DAYS 30 capsule 1  . estradiol (ESTRING) 2 MG vaginal ring Place 2 mg vaginally every 3 (three) months. Wears for 3 months (Patient not taking: Reported on 11/19/2015) 1 each 4   No current facility-administered medications for this visit.    Family History  Problem Relation Age of Onset  . Parkinsonism Father   . Colon cancer Neg Hx   . Esophageal cancer Neg Hx   . Rectal cancer Neg Hx   . Stomach cancer Neg Hx     ROS:  Pertinent items are noted in HPI.  Otherwise, a comprehensive ROS was negative.  Exam:   BP 136/80 mmHg  Pulse 72  Resp 16  Ht 5' 5.25" (1.657 m)  Wt 143 lb (64.864 kg)  BMI 23.62 kg/m2  LMP 10/02/1997     Height: 5' 5.25" (165.7 cm)  Ht Readings from Last 3 Encounters:  11/19/15 5' 5.25" (1.657 m)  07/26/15 5\' 6"  (1.676 m)  07/17/15 5\' 6"  (1.676 m)    General  appearance: alert, cooperative and appears stated age Head: Normocephalic, without obvious abnormality, atraumatic Neck: no adenopathy, supple, symmetrical, trachea midline and thyroid normal to inspection and palpation Lungs: clear to auscultation bilaterally Breasts: normal appearance, no masses or tenderness Heart: regular rate and rhythm Abdomen: soft, non-tender; bowel sounds normal; no masses,  no organomegaly Extremities: extremities normal, atraumatic, no cyanosis or edema Skin: Skin color, texture, turgor normal. No rashes or lesions Lymph nodes: Cervical, supraclavicular, and axillary nodes normal. No abnormal inguinal nodes palpated Neurologic: Grossly normal   Pelvic: External genitalia:  no lesions              Urethra:  normal appearing urethra with no masses, tenderness or lesions              Bartholins and Skenes: normal                 Vagina: normal appearing vagina with normal color and discharge, no lesions              Cervix: no lesions              Pap taken: No. Bimanual Exam:  Uterus:  normal size, contour, position, consistency, mobility, non-tender              Adnexa: normal adnexa and no mass, fullness, tenderness               Rectovaginal: Confirms               Anus:  normal sphincter tone, no lesions  Chaperone was present for exam.   A: Well Woman with normal exam PMP, No HRT H/O endometrial polyp removed 2013.  No vaginal bleeding since then. H/O low Vit D. On supplementation Vaginal atrophic changes  P: Mammogram yearly. Pt has dense breasts. Doing 3D yearly pap smear today. Pt prefers yearly. On Vit D, 50K weekly. Rx to pharmacy for 90 day supply.  Using mail order.  Estring vaginal ring 2mg  pv every three months Screening labs and vaccines with Dr. Margurite Auerbach return annually or prn

## 2015-11-23 LAB — IPS PAP SMEAR ONLY

## 2015-11-23 NOTE — Progress Notes (Signed)
Cardiology Office Note   Date:  11/29/2015   ID:  Jenna, [name] [birth year], MRN [id number]  PCP:  Jerlyn Ly, MD  Cardiologist:   Acie Fredrickson Jenna Cheng, MD  ( former Ron Parker patient )   Chief Complaint  Patient presents with  . Atrial Fibrillation    NO CHEST PAIN, SOB OR SWELLING   Problem List 1. Paroxysmal atrial fib  2. Hyperlipidemia 3. Mitral valve prolapse with mild MR    History of Present Illness: [name] is a 75 y.o. female who presents to establish care. She has seen Dr. Ron Parker in the past. Has a hx of paroxysmal atrial fib   She has been stable on Flecainide   Past Medical History  Diagnosis Date  . Hyperlipidemia   . Drug therapy     Flecainide therapy started March, 2011, treadmill March, 2011, no exercise-induced ectopy  . Atrial fibrillation [facility])     Very rare episodes  / episode March, 2011  CHADS score O  . Ejection fraction     EF 55-65%, echo, October, 2009  . Mitral regurgitation     Mild, echo, October, 2009  . MVP (mitral valve prolapse)     per pt "mild"  . Chronic kidney disease     hx of kidney stones  . Abnormal Pap smear   . Kidney stone     Past Surgical History  Procedure Laterality Date  . Tonsillectomy and adenoidectomy    . Varicose vein ablation      left  . Colonoscopy    . Dilatation & currettage/hysteroscopy with resectocope  08/20/2012    Procedure: [place];  Surgeon: Lyman Speller, MD;  Location: [facility];  Service: Gynecology;  Laterality: N/A;  . Gynecologic cryosurgery  1980's  . Hysteroscopy  11/3    polyp resection     Current Outpatient Prescriptions  Medication Sig Dispense Refill  . acetaminophen (TYLENOL) 500 MG tablet Take 500-1,000 mg by mouth every 6 (six) hours as needed for moderate pain or headache.    Marland Kitchen apixaban (ELIQUIS) 5 MG TABS tablet Take 1 tablet (5 mg total) by mouth 2 (two) times daily. 180 tablet 3  . B Complex Vitamins (VITAMIN  B COMPLEX) TABS Take 1 tablet by mouth at bedtime.    Marland Kitchen diltiazem (CARDIZEM CD) 120 MG 24 hr capsule TAKE 1 CAPSULE BY MOUTH ONCE A DAY 90 capsule 3  . estradiol (ESTRING) 2 MG vaginal ring Place 2 mg vaginally every 3 (three) months. 1 each 4  . fish oil-omega-3 fatty acids 1000 MG capsule Take 1 g by mouth at bedtime.     . flecainide (TAMBOCOR) 50 MG tablet Take 1 tablet (50 mg total) by mouth every 12 (twelve) hours. 180 tablet 3  . ibuprofen (ADVIL,MOTRIN) 200 MG tablet Take 600 mg by mouth every 4 (four) hours as needed for headache or moderate pain.    . Magnesium 500 MG TABS Take 1 tablet by mouth every other day. In PM    . Multiple Vitamin (MULTIVITAMIN) capsule Take 1 capsule by mouth at bedtime.     Marland Kitchen PREVIDENT 5000 BOOSTER PLUS 1.1 % PSTE APPLY PEA SIZE AMOUNT ONTO BRUSH AND APPLY TO ALL TEETH SURFACE X2 MINS ONCE DAILY AND EXPECTORATE  4  . simvastatin (ZOCOR) 40 MG tablet Take 40 mg by mouth daily.    . Vitamin D, Ergocalciferol, (DRISDOL) 50000 units CAPS capsule TAKE ONE CAPSULE BY MOUTH EVERY 7 DAYS  12 capsule 4   No current facility-administered medications for this visit.    Allergies:   Tetanus toxoid    Social History:  The patient  reports that she has never smoked. She has never used smokeless tobacco. She reports that she drinks about 1.2 - 1.8 oz of alcohol per week. She reports that she does not use illicit drugs.   Family History:  The patient's family history includes Parkinsonism in her father. There is no history of Colon cancer, Esophageal cancer, Rectal cancer, or Stomach cancer.    ROS:  Please see the history of present illness.    Review of Systems: Constitutional:  denies fever, chills, diaphoresis, appetite change and fatigue.  HEENT: denies photophobia, eye pain, redness, hearing loss, ear pain, congestion, sore throat, rhinorrhea, sneezing, neck pain, neck stiffness and tinnitus.  Respiratory: denies SOB, DOE, cough, chest tightness, and wheezing.    Cardiovascular: denies chest pain, palpitations and leg swelling.  Gastrointestinal: denies nausea, vomiting, abdominal pain, diarrhea, constipation, blood in stool.  Genitourinary: denies dysuria, urgency, frequency, hematuria, flank pain and difficulty urinating.  Musculoskeletal: denies  myalgias, back pain, joint swelling, arthralgias and gait problem.   Skin: denies pallor, rash and wound.  Neurological: denies dizziness, seizures, syncope, weakness, light-headedness, numbness and headaches.   Hematological: denies adenopathy, easy bruising, personal or family bleeding history.  Psychiatric/ Behavioral: denies suicidal ideation, mood changes, confusion, nervousness, sleep disturbance and agitation.       All other systems are reviewed and negative.    PHYSICAL EXAM: VS:  BP 122/86 mmHg  Pulse 76  Ht 5\' 5"  (1.651 m)  Wt 143 lb 6.4 oz (65.046 kg)  BMI 23.86 kg/m2  LMP 10/02/1997 , BMI Body mass index is 23.86 kg/(m^2). GEN: Well nourished, well developed, in no acute distress HEENT: normal Neck: no JVD, carotid bruits, or masses Cardiac: RRR; no murmurs, rubs, or gallops,no edema  Respiratory:  clear to auscultation bilaterally, normal work of breathing GI: soft, nontender, nondistended, + BS MS: no deformity or atrophy Skin: warm and dry, no rash Neuro:  Strength and sensation are intact Psych: normal   EKG:  EKG is ordered today. The ekg ordered today demonstrates  NSR at 76.  No ST or T wave changes.    Recent Labs: 02/22/2015: ALT 17 07/17/2015: BUN 15; Creatinine, Ser 0.83; Hemoglobin 14.4; Platelets 200; Potassium 3.9; Sodium 137 07/26/2015: TSH 1.709    Lipid Panel No results found for: CHOL, TRIG, HDL, CHOLHDL, VLDL, LDLCALC, LDLDIRECT    Wt Readings from Last 3 Encounters:  11/29/15 143 lb 6.4 oz (65.046 kg)  11/19/15 143 lb (64.864 kg)  07/26/15 142 lb 12.8 oz (64.774 kg)      Other studies Reviewed: Additional studies/ records that were reviewed  today include: . Review of the above records demonstrates:    ASSESSMENT AND PLAN:  1. Paroxysmal atrial fib  2. Hyperlipidemia 3. Mitral valve prolapse with mild MR 4.   Current medicines are reviewed at length with the patient today.  The patient does not have concerns regarding medicines.  The following changes have been made:  no change  Labs/ tests ordered today include:   Orders Placed This Encounter  Procedures  . Basic Metabolic Panel (BMET)  . EKG 12-Lead     Disposition:   FU with me in 3 months      [name], Jenna Cheng, MD  11/29/2015 2:25 PM    [place], [place]  [id number] Phone: [phone]; Fax: [phone]   [facility]  [address] [place], [place]  [address] [phone]   Fax [phone]

## 2015-11-25 ENCOUNTER — Ambulatory Visit
Admission: RE | Admit: 2015-11-25 | Discharge: 2015-11-25 | Disposition: A | Discharge status: Home / Self Care | Payer: Medicare Other | Source: Ambulatory Visit

## 2015-11-25 DIAGNOSIS — Z1231 Encounter for screening mammogram for malignant neoplasm of breast: Secondary | ICD-10-CM

## 2015-11-29 ENCOUNTER — Ambulatory Visit (INDEPENDENT_AMBULATORY_CARE_PROVIDER_SITE_OTHER): Payer: Medicare Other | Admitting: Cardiovascular Disease

## 2015-11-29 ENCOUNTER — Encounter: Payer: Self-pay | Admitting: Cardiovascular Disease

## 2015-11-29 VITALS — BP 122/86 | HR 76 | Ht 65.0 in | Wt 143.4 lb

## 2015-11-29 DIAGNOSIS — I10 Essential (primary) hypertension: Secondary | ICD-10-CM

## 2015-11-29 DIAGNOSIS — I480 Paroxysmal atrial fibrillation: Secondary | ICD-10-CM | POA: Diagnosis not present

## 2015-11-29 LAB — BASIC METABOLIC PANEL
BUN: 19 mg/dL (ref 7–25)
CO2: 27 mmol/L (ref 20–31)
Calcium: 9.2 mg/dL (ref 8.6–10.4)
Chloride: 105 mmol/L (ref 98–110)
Creat: 0.79 mg/dL (ref 0.60–0.93)
GLUCOSE: 75 mg/dL (ref 65–99)
POTASSIUM: 4.1 mmol/L (ref 3.5–5.3)
SODIUM: 140 mmol/L (ref 135–146)

## 2015-11-29 MED ORDER — APIXABAN 5 MG PO TABS: 5.0000 mg | ORAL_TABLET | Freq: Two times a day (BID) | ORAL | Status: DC

## 2015-11-29 NOTE — Patient Instructions (Signed)
Medication Instructions:  Your physician recommends that you continue on your current medications as directed. Please refer to the Current Medication list given to you today.   Labwork: TODAY - basic metabolic panel   Testing/Procedures: None Ordered   Follow-Up: Your physician wants you to follow-up in: 6 months with Dr. Nahser. You will receive a reminder letter in the mail two months in advance. If you don't receive a letter, please call our office to schedule the follow-up appointment.   If you need a refill on your cardiac medications before your next appointment, please call your pharmacy.   Thank you for choosing [facility]! Michelle Swinyer, RN [phone]    

## 2016-04-10 ENCOUNTER — Telehealth: Payer: Self-pay

## 2016-04-10 ENCOUNTER — Telehealth: Payer: Self-pay | Admitting: *Deleted

## 2016-04-10 NOTE — Telephone Encounter (Signed)
Patient stated that the eliquis requires a prior authorization.

## 2016-04-10 NOTE — Telephone Encounter (Signed)
Submitted today to [facility].

## 2016-04-10 NOTE — Telephone Encounter (Signed)
Prior auth for Eliquis 5mg submitted to [facility]. 

## 2016-04-11 ENCOUNTER — Telehealth: Payer: Self-pay

## 2016-04-11 NOTE — Telephone Encounter (Signed)
Eliquis approved through 07/19/2016 (?) [id number].local pharmacy aware.

## 2016-04-11 NOTE — Telephone Encounter (Signed)
PA already on file. I don't know who told her she needed another one.

## 2016-04-19 ENCOUNTER — Encounter: Payer: Self-pay | Admitting: Cardiovascular Disease

## 2016-06-08 ENCOUNTER — Ambulatory Visit: Payer: Medicare Other | Admitting: Cardiovascular Disease

## 2016-06-21 ENCOUNTER — Encounter: Payer: Self-pay | Admitting: Cardiovascular Disease

## 2016-07-06 ENCOUNTER — Ambulatory Visit (INDEPENDENT_AMBULATORY_CARE_PROVIDER_SITE_OTHER): Payer: Medicare Other | Admitting: Cardiovascular Disease

## 2016-07-06 ENCOUNTER — Encounter: Payer: Self-pay | Admitting: Cardiovascular Disease

## 2016-07-06 VITALS — BP 138/90 | HR 68 | Ht 65.0 in | Wt 147.0 lb

## 2016-07-06 DIAGNOSIS — I480 Paroxysmal atrial fibrillation: Secondary | ICD-10-CM | POA: Diagnosis not present

## 2016-07-06 NOTE — Progress Notes (Signed)
Cardiology Office Note   Date:  07/06/2016   ID:  [name], DOB 12/31/1940, MRN [id number]  PCP:  Jerlyn Ly, MD  Cardiologist:   Mertie Moores, MD  ( former Ron Parker patient )   Chief Complaint  Patient presents with  . Follow-up    PAF    Problem List 1. Paroxysmal atrial fib  2. Hyperlipidemia 3. Mitral valve prolapse with mild MR     [name] is a 75 y.o. female who presents to establish care. She has seen Dr. Ron Parker in the past. Has a hx of paroxysmal atrial fib   She has been stable on Flecainide  Oct. 5, 2017:  [name] is seen for follow up of her PAF  Spent the summer in [place]  Has been keeping up with her BP.     Past Medical History:  Diagnosis Date  . Abnormal Pap smear   . Atrial fibrillation [facility])    Very rare episodes  / episode March, 2011  CHADS score O  . Chronic kidney disease    hx of kidney stones  . Drug therapy    Flecainide therapy started March, 2011, treadmill March, 2011, no exercise-induced ectopy  . Ejection fraction    EF 55-65%, echo, October, 2009  . Hyperlipidemia   . Kidney stone   . Mitral regurgitation    Mild, echo, October, 2009  . MVP (mitral valve prolapse)    per pt "mild"    Past Surgical History:  Procedure Laterality Date  . COLONOSCOPY    . DILATATION & CURRETTAGE/HYSTEROSCOPY WITH RESECTOCOPE  08/20/2012   Procedure: [place];  Surgeon: Lyman Speller, MD;  Location: [facility];  Service: Gynecology;  Laterality: N/A;  . GYNECOLOGIC CRYOSURGERY  1980's  . HYSTEROSCOPY  11/3   polyp resection  . TONSILLECTOMY AND ADENOIDECTOMY    . varicose vein ablation     left     Current Outpatient Prescriptions  Medication Sig Dispense Refill  . acetaminophen (TYLENOL) 500 MG tablet Take 500-1,000 mg by mouth every 6 (six) hours as needed for moderate pain or headache.    Marland Kitchen apixaban (ELIQUIS) 5 MG TABS tablet Take 1 tablet (5 mg total) by mouth 2  (two) times daily. 180 tablet 3  . B Complex Vitamins (VITAMIN B COMPLEX) TABS Take 1 tablet by mouth at bedtime.    Marland Kitchen diltiazem (CARDIZEM CD) 120 MG 24 hr capsule TAKE 1 CAPSULE BY MOUTH ONCE A DAY 90 capsule 3  . estradiol (ESTRING) 2 MG vaginal ring Place 2 mg vaginally every 3 (three) months. 1 each 4  . fish oil-omega-3 fatty acids 1000 MG capsule Take 1 g by mouth at bedtime.     . flecainide (TAMBOCOR) 50 MG tablet Take 1 tablet (50 mg total) by mouth every 12 (twelve) hours. 180 tablet 3  . ibuprofen (ADVIL,MOTRIN) 200 MG tablet Take 600 mg by mouth every 4 (four) hours as needed for headache or moderate pain.    . Magnesium 500 MG TABS Take 1 tablet by mouth every other day. In PM    . Multiple Vitamin (MULTIVITAMIN) capsule Take 1 capsule by mouth at bedtime.     Marland Kitchen PREVIDENT 5000 BOOSTER PLUS 1.1 % PSTE APPLY PEA SIZE AMOUNT ONTO BRUSH AND APPLY TO ALL TEETH SURFACE X2 MINS ONCE DAILY AND EXPECTORATE  4  . simvastatin (ZOCOR) 10 MG tablet Take 10 mg by mouth daily.    . Vitamin D, Ergocalciferol, (  DRISDOL) 50000 units CAPS capsule TAKE ONE CAPSULE BY MOUTH EVERY 7 DAYS 12 capsule 4   No current facility-administered medications for this visit.     Allergies:   Tetanus toxoid    Social History:  The patient  reports that she has never smoked. She has never used smokeless tobacco. She reports that she drinks about 1.2 - 1.8 oz of alcohol per week . She reports that she does not use drugs.   Family History:  The patient's family history includes Parkinsonism in her father.   ROS:  Please see the history of present illness.    Review of Systems: Constitutional:  denies fever, chills, diaphoresis, appetite change and fatigue.  HEENT: denies photophobia, eye pain, redness, hearing loss, ear pain, congestion, sore throat, rhinorrhea, sneezing, neck pain, neck stiffness and tinnitus.  Respiratory: denies SOB, DOE, cough, chest tightness, and wheezing.  Cardiovascular: denies chest  pain, palpitations and leg swelling.  Gastrointestinal: denies nausea, vomiting, abdominal pain, diarrhea, constipation, blood in stool.  Genitourinary: denies dysuria, urgency, frequency, hematuria, flank pain and difficulty urinating.  Musculoskeletal: denies  myalgias, back pain, joint swelling, arthralgias and gait problem.   Skin: denies pallor, rash and wound.  Neurological: denies dizziness, seizures, syncope, weakness, light-headedness, numbness and headaches.   Hematological: denies adenopathy, easy bruising, personal or family bleeding history.  Psychiatric/ Behavioral: denies suicidal ideation, mood changes, confusion, nervousness, sleep disturbance and agitation.       All other systems are reviewed and negative.    PHYSICAL EXAM: VS:  BP 138/90 (BP Location: Left Arm, Patient Position: Sitting, Cuff Size: Normal)   Pulse 68   Ht 5\' 5"  (1.651 m)   Wt 147 lb (66.7 kg)   LMP 10/02/1997   BMI 24.46 kg/m  , BMI Body mass index is 24.46 kg/m. GEN: Well nourished, well developed, in no acute distress  HEENT: normal  Neck: no JVD, carotid bruits, or masses Cardiac: RRR; no murmurs, rubs, or gallops,no edema  Respiratory:  clear to auscultation bilaterally, normal work of breathing GI: soft, nontender, nondistended, + BS MS: no deformity or atrophy  Skin: warm and dry, no rash Neuro:  Strength and sensation are intact Psych: normal   EKG:  EKG is ordered today. The ekg ordered today demonstrates  NSR at 76.  No ST or T wave changes.    Recent Labs: 07/17/2015: Hemoglobin 14.4; Platelets 200 07/26/2015: TSH 1.709 11/29/2015: BUN 19; Creat 0.79; Potassium 4.1; Sodium 140    Lipid Panel No results found for: CHOL, TRIG, HDL, CHOLHDL, VLDL, LDLCALC, LDLDIRECT    Wt Readings from Last 3 Encounters:  07/06/16 147 lb (66.7 kg)  11/29/15 143 lb 6.4 oz (65 kg)  11/19/15 143 lb (64.9 kg)      Other studies Reviewed: Additional studies/ records that were reviewed  today include: . Review of the above records demonstrates:    ASSESSMENT AND PLAN:  1. Paroxysmal atrial fib  - is in NSR at present Discussed changing to Xarelto . After much discussion, she would like to continue with eliquis  Offered for her to meet with Fuller Canada , Pharm D if she would like   2. Hyperlipidemia- managed by primary   3. Mitral valve prolapse with mild MR 4.   Current medicines are reviewed at length with the patient today.  The patient does not have concerns regarding medicines.  The following changes have been made:  no change  Labs/ tests ordered today include:   No orders of  the defined types were placed in this encounter.    Disposition:   FU with me in 3 months      Mertie Moores, MD  07/06/2016 11:29 AM    [address] [place], [place], [place]  [address] Phone: [phone]; Fax: [phone]

## 2016-07-06 NOTE — Patient Instructions (Signed)
Medication Instructions:  Your physician recommends that you continue on your current medications as directed. Please refer to the Current Medication list given to you today.   Labwork: None Ordered   Testing/Procedures: None Ordered   Follow-Up: Your physician wants you to follow-up in: 6 months with Dr. Nahser.  You will receive a reminder letter in the mail two months in advance. If you don't receive a letter, please call our office to schedule the follow-up appointment.   If you need a refill on your cardiac medications before your next appointment, please call your pharmacy.   Thank you for choosing [facility]! Zacharius Funari, RN [phone]    

## 2016-09-02 ENCOUNTER — Other Ambulatory Visit: Payer: Self-pay | Admitting: Cardiology

## 2016-11-08 ENCOUNTER — Other Ambulatory Visit: Payer: Self-pay | Admitting: Internal Medicine

## 2016-11-08 DIAGNOSIS — Z1231 Encounter for screening mammogram for malignant neoplasm of breast: Secondary | ICD-10-CM

## 2016-11-20 DIAGNOSIS — R2681 Unsteadiness on feet: Secondary | ICD-10-CM | POA: Diagnosis not present

## 2016-11-20 DIAGNOSIS — Z6823 Body mass index (BMI) 23.0-23.9, adult: Secondary | ICD-10-CM | POA: Diagnosis not present

## 2016-11-20 DIAGNOSIS — R531 Weakness: Secondary | ICD-10-CM | POA: Diagnosis not present

## 2016-11-20 DIAGNOSIS — B349 Viral infection, unspecified: Secondary | ICD-10-CM | POA: Diagnosis not present

## 2016-12-13 ENCOUNTER — Ambulatory Visit
Admission: RE | Admit: 2016-12-13 | Discharge: 2016-12-13 | Disposition: A | Discharge status: Home / Self Care | Payer: PPO | Source: Ambulatory Visit | Attending: Internal Medicine | Admitting: Internal Medicine

## 2016-12-13 DIAGNOSIS — Z1231 Encounter for screening mammogram for malignant neoplasm of breast: Secondary | ICD-10-CM | POA: Diagnosis not present

## 2016-12-14 DIAGNOSIS — H2513 Age-related nuclear cataract, bilateral: Secondary | ICD-10-CM | POA: Diagnosis not present

## 2017-01-02 ENCOUNTER — Emergency Department (HOSPITAL_COMMUNITY)
Admission: EM | Admit: 2017-01-02 | Discharge: 2017-01-02 | Disposition: A | Discharge status: Home / Self Care | Payer: PPO | Attending: Emergency Medicine | Admitting: Emergency Medicine

## 2017-01-02 ENCOUNTER — Encounter (HOSPITAL_COMMUNITY): Payer: Self-pay | Admitting: Emergency Medicine

## 2017-01-02 ENCOUNTER — Emergency Department (HOSPITAL_COMMUNITY): Payer: PPO

## 2017-01-02 DIAGNOSIS — Z79899 Other long term (current) drug therapy: Secondary | ICD-10-CM | POA: Insufficient documentation

## 2017-01-02 DIAGNOSIS — R3129 Other microscopic hematuria: Secondary | ICD-10-CM | POA: Diagnosis not present

## 2017-01-02 DIAGNOSIS — R000 Tachycardia, unspecified: Secondary | ICD-10-CM | POA: Diagnosis not present

## 2017-01-02 DIAGNOSIS — I4891 Unspecified atrial fibrillation: Secondary | ICD-10-CM | POA: Diagnosis not present

## 2017-01-02 DIAGNOSIS — N189 Chronic kidney disease, unspecified: Secondary | ICD-10-CM | POA: Diagnosis not present

## 2017-01-02 DIAGNOSIS — R55 Syncope and collapse: Secondary | ICD-10-CM | POA: Diagnosis not present

## 2017-01-02 DIAGNOSIS — R42 Dizziness and giddiness: Secondary | ICD-10-CM | POA: Diagnosis not present

## 2017-01-02 LAB — URINALYSIS, ROUTINE W REFLEX MICROSCOPIC
Bilirubin Urine: NEGATIVE
Glucose, UA: NEGATIVE mg/dL
Ketones, ur: 20 mg/dL — AB
LEUKOCYTES UA: NEGATIVE
Nitrite: NEGATIVE
PH: 5 (ref 5.0–8.0)
Protein, ur: NEGATIVE mg/dL
SPECIFIC GRAVITY, URINE: 1.017 (ref 1.005–1.030)

## 2017-01-02 LAB — CBC WITH DIFFERENTIAL/PLATELET
BASOS ABS: 0 10*3/uL (ref 0.0–0.1)
Basophils Relative: 0 %
EOS PCT: 0 %
Eosinophils Absolute: 0 10*3/uL (ref 0.0–0.7)
HCT: 46.1 % — ABNORMAL HIGH (ref 36.0–46.0)
HEMOGLOBIN: 15.7 g/dL — AB (ref 12.0–15.0)
Lymphocytes Relative: 14 %
Lymphs Abs: 1.1 10*3/uL (ref 0.7–4.0)
MCH: 30.3 pg (ref 26.0–34.0)
MCHC: 34.1 g/dL (ref 30.0–36.0)
MCV: 88.8 fL (ref 78.0–100.0)
Monocytes Absolute: 0.8 10*3/uL (ref 0.1–1.0)
Monocytes Relative: 10 %
NEUTROS PCT: 76 %
Neutro Abs: 6 10*3/uL (ref 1.7–7.7)
PLATELETS: 202 10*3/uL (ref 150–400)
RBC: 5.19 MIL/uL — AB (ref 3.87–5.11)
RDW: 12.7 % (ref 11.5–15.5)
WBC: 7.8 10*3/uL (ref 4.0–10.5)

## 2017-01-02 LAB — BASIC METABOLIC PANEL
ANION GAP: 11 (ref 5–15)
BUN: 13 mg/dL (ref 6–20)
CO2: 25 mmol/L (ref 22–32)
Calcium: 9.1 mg/dL (ref 8.9–10.3)
Chloride: 101 mmol/L (ref 101–111)
Creatinine, Ser: 0.92 mg/dL (ref 0.44–1.00)
GFR, EST NON AFRICAN AMERICAN: 60 mL/min — AB (ref >60)
Glucose, Bld: 123 mg/dL — ABNORMAL HIGH (ref 65–99)
Potassium: 4 mmol/L (ref 3.5–5.1)
SODIUM: 137 mmol/L (ref 135–145)

## 2017-01-02 LAB — I-STAT TROPONIN, ED
TROPONIN I, POC: 0.01 ng/mL (ref 0.00–0.08)
TROPONIN I, POC: 0.02 ng/mL (ref 0.00–0.08)

## 2017-01-02 LAB — TSH: TSH: 1.789 u[IU]/mL (ref 0.350–4.500)

## 2017-01-02 LAB — MAGNESIUM: Magnesium: 2.2 mg/dL (ref 1.7–2.4)

## 2017-01-02 LAB — PROTIME-INR
INR: 1.33
PROTHROMBIN TIME: 16.6 s — AB (ref 11.4–15.2)

## 2017-01-02 MED ORDER — DILTIAZEM LOAD VIA INFUSION
15.0000 mg | Freq: Once | INTRAVENOUS | Status: AC
  Administered 2017-01-02: 15 mg via INTRAVENOUS
  Filled 2017-01-02: qty 15

## 2017-01-02 MED ORDER — SODIUM CHLORIDE 0.9 % IV SOLN
Freq: Once | INTRAVENOUS | Status: AC
  Administered 2017-01-02: 10:00:00 via INTRAVENOUS

## 2017-01-02 MED ORDER — CEPHALEXIN 500 MG PO CAPS: ORAL_CAPSULE | ORAL | 0 refills | Status: DC

## 2017-01-02 MED ORDER — DILTIAZEM HCL-DEXTROSE 100-5 MG/100ML-% IV SOLN (PREMIX)
5.0000 mg/h | INTRAVENOUS | Status: DC
  Administered 2017-01-02: 5 mg/h via INTRAVENOUS
  Filled 2017-01-02: qty 100

## 2017-01-02 NOTE — ED Notes (Signed)
Pt given orange juice per Dr. Suzy Bouchard.

## 2017-01-02 NOTE — ED Provider Notes (Signed)
[facility] DEPT Provider Note   CSN: [id number] Arrival date & time: 01/02/17  8469     History   Chief Complaint Chief Complaint  Patient presents with  . Loss of Consciousness    HPI [name] is a 76 y.o. female.  HPI A she reports her atrial fibrillation started during the night. She got up to go to the bathroom and felt the palpitations and fast heart rate. She reports in the past 12 years she has had 4 times and is aware when it occurs. She sat down on a bench in her bathroom because she suddenly did not feel well. Fortunately, her husband came in to check on her, she was slumped to the side but had not fallen. He eased her to the ground in supine position and she awakened upon being laid supine. No fall or injury associated. They were familiar with episodes of atrial fibrillation and came to the hospital by private vehicle. Patient is compliant with daily Cardizem and twice daily flecainide. She has taken her usual morning Cardizem dose and took an early dose of flecainide at 5 AM. Patient is compliant with daily Eliquis.  On review of systems, patient reports 2 days ago, on Easter she felt a little ill with some sensation of chills and malaise. She has not had any other focal symptoms associated with this. Review of systems is otherwise negative. Past Medical History:  Diagnosis Date  . Abnormal Pap smear   . Atrial fibrillation [facility])    Very rare episodes  / episode March, 2011  CHADS score O  . Chronic kidney disease    hx of kidney stones  . Drug therapy    Flecainide therapy started March, 2011, treadmill March, 2011, no exercise-induced ectopy  . Ejection fraction    EF 55-65%, echo, October, 2009  . Hyperlipidemia   . Kidney stone   . Mitral regurgitation    Mild, echo, October, 2009  . MVP (mitral valve prolapse)    per pt "mild"    Patient Active Problem List   Diagnosis Date Noted  . Abdominal pain in pregnancy, antepartum 02/17/2015  . Acute  left lower quadrant pain 02/17/2015  . CN (constipation) 02/17/2015  . Varicose veins 08/03/2014  . Spider veins of both lower extremities 08/03/2014  . Varicose veins of lower extremities with other complications 62/95/2841  . Hyperlipidemia   . Drug therapy   . Atrial fibrillation ([place])   . Ejection fraction   . Mitral regurgitation     Past Surgical History:  Procedure Laterality Date  . COLONOSCOPY    . DILATATION & CURRETTAGE/HYSTEROSCOPY WITH RESECTOCOPE  08/20/2012   Procedure: [place];  Surgeon: Lyman Speller, MD;  Location: [facility];  Service: Gynecology;  Laterality: N/A;  . GYNECOLOGIC CRYOSURGERY  1980's  . HYSTEROSCOPY  11/3   polyp resection  . TONSILLECTOMY AND ADENOIDECTOMY    . varicose vein ablation     left    OB History    Gravida Para Term Preterm AB Living   2 2 2     2    SAB TAB Ectopic Multiple Live Births                   Home Medications    Prior to Admission medications   Medication Sig Start Date End Date Taking? Authorizing Provider  acetaminophen (TYLENOL) 500 MG tablet Take 500-1,000 mg by mouth every 6 (six) hours as needed for moderate pain  or headache.   Yes Historical Provider, MD  apixaban (ELIQUIS) 5 MG TABS tablet Take 1 tablet (5 mg total) by mouth 2 (two) times daily. 11/29/15  Yes Thayer Headings, MD  B Complex Vitamins (VITAMIN B COMPLEX) TABS Take 1 tablet by mouth at bedtime.   Yes Historical Provider, MD  CARTIA XT 120 MG 24 hr capsule TAKE 1 CAPSULE BY MOUTH  ONCE A DAY 09/04/16  Yes Thayer Headings, MD  estradiol (ESTRING) 2 MG vaginal ring Place 2 mg vaginally every 3 (three) months. 11/19/15  Yes Megan Salon, MD  fish oil-omega-3 fatty acids 1000 MG capsule Take 1 g by mouth at bedtime.    Yes Historical Provider, MD  flecainide (TAMBOCOR) 50 MG tablet TAKE 1 TABLET BY MOUTH  EVERY 12 HOURS 09/04/16  Yes Thayer Headings, MD  Magnesium 500 MG TABS Take 1 tablet by mouth every  other day. In PM   Yes Historical Provider, MD  Multiple Vitamin (MULTIVITAMIN) capsule Take 1 capsule by mouth at bedtime.    Yes Historical Provider, MD  PREVIDENT 5000 BOOSTER PLUS 1.1 % PSTE APPLY PEA SIZE AMOUNT ONTO BRUSH AND APPLY TO ALL TEETH SURFACE X2 MINS ONCE DAILY AND EXPECTORATE 10/25/15  Yes Historical Provider, MD  simvastatin (ZOCOR) 10 MG tablet Take 10 mg by mouth daily. 05/16/16  Yes Historical Provider, MD  Vitamin D, Ergocalciferol, (DRISDOL) 50000 units CAPS capsule TAKE ONE CAPSULE BY MOUTH EVERY 7 DAYS 11/19/15  Yes Megan Salon, MD  cephALEXin (KEFLEX) 500 MG capsule 2 caps po bid x 7 days 01/02/17   Charlesetta Shanks, MD  ibuprofen (ADVIL,MOTRIN) 200 MG tablet Take 600 mg by mouth every 4 (four) hours as needed for headache or moderate pain.    Historical Provider, MD    Family History Family History  Problem Relation Age of Onset  . Parkinsonism Father   . Colon cancer Neg Hx   . Esophageal cancer Neg Hx   . Rectal cancer Neg Hx   . Stomach cancer Neg Hx     Social History Social History  Substance Use Topics  . Smoking status: Never Smoker  . Smokeless tobacco: Never Used  . Alcohol use 1.2 - 1.8 oz/week    1 - 2 Standard drinks or equivalent, 1 Glasses of wine per week     Comment: socially     Allergies   Tetanus toxoid   Review of Systems Review of Systems 10 Systems reviewed and are negative for acute change except as noted in the HPI.   Physical Exam Updated Vital Signs BP 120/60   Pulse 92   Temp 99.5 F (37.5 C) (Oral)   Resp (!) 29   LMP 10/02/1997   SpO2 100%   Physical Exam  Constitutional: She is oriented to person, place, and time. She appears well-developed and well-nourished. No distress.  HENT:  Head: Normocephalic and atraumatic.  Mouth/Throat: Oropharynx is clear and moist.  Eyes: Conjunctivae and EOM are normal.  Neck: Neck supple.  Cardiovascular:  No murmur heard. Tachycardia, irregularly irregular. Cannot appreciate  murmur at this rate. Distal pulses are strong.  Pulmonary/Chest: Effort normal and breath sounds normal. No respiratory distress. She has no wheezes.  Abdominal: Soft. She exhibits no distension. There is no tenderness. There is no guarding.  Musculoskeletal: She exhibits no edema or tenderness.  Neurological: She is alert and oriented to person, place, and time. No cranial nerve deficit. She exhibits normal muscle tone. Coordination normal.  Skin: Skin is warm and dry.  Psychiatric: She has a normal mood and affect.  Nursing note and vitals reviewed.    ED Treatments / Results  Labs (all labs ordered are listed, but only abnormal results are displayed) Labs Reviewed  BASIC METABOLIC PANEL - Abnormal; Notable for the following:       Result Value   Glucose, Bld 123 (*)    GFR calc non Af Amer 60 (*)    All other components within normal limits  CBC WITH DIFFERENTIAL/PLATELET - Abnormal; Notable for the following:    RBC 5.19 (*)    Hemoglobin 15.7 (*)    HCT 46.1 (*)    All other components within normal limits  PROTIME-INR - Abnormal; Notable for the following:    Prothrombin Time 16.6 (*)    All other components within normal limits  URINALYSIS, ROUTINE W REFLEX MICROSCOPIC - Abnormal; Notable for the following:    APPearance HAZY (*)    Hgb urine dipstick LARGE (*)    Ketones, ur 20 (*)    Bacteria, UA FEW (*)    Squamous Epithelial / LPF 0-5 (*)    All other components within normal limits  URINE CULTURE  MAGNESIUM  TSH  I-STAT TROPOININ, ED  I-STAT TROPOININ, ED    EKG  EKG Interpretation  Date/Time:  Tuesday January 02 2017 09:25:37 EDT Ventricular Rate:  155 PR Interval:    QRS Duration: 118 QT Interval:  234 QTC Calculation: 375 R Axis:   38 Text Interpretation:  atrial flutter 2:1. Confirmed by Johnney Killian, MD, Jeannie Done [phone]) on 01/02/2017 10:01:23 AM       Radiology Dg Chest Port 1 View  Result Date: 01/02/2017 CLINICAL DATA:  Atrial fibrillation,  dizziness, weakness EXAM: PORTABLE CHEST 1 VIEW COMPARISON:  Chest x-ray of 07/17/2015 FINDINGS: No active infiltrate or effusion is seen. Mediastinal and hilar contours are unremarkable. The heart is within normal limits in size. No bony abnormality is seen. IMPRESSION: No active disease. Electronically Signed   By: Ivar Drape M.D.   On: 01/02/2017 10:10    Procedures Procedures (including critical care time) CRITICAL CARE Performed by: Charlesetta Shanks   Total critical care time: 30 minutes  Critical care time was exclusive of separately billable procedures and treating other patients.  Critical care was necessary to treat or prevent imminent or life-threatening deterioration.  Critical care was time spent personally by me on the following activities: development of treatment plan with patient and/or surrogate as well as nursing, discussions with consultants, evaluation of patient's response to treatment, examination of patient, obtaining history from patient or surrogate, ordering and performing treatments and interventions, ordering and review of laboratory studies, ordering and review of radiographic studies, pulse oximetry and re-evaluation of patient's condition. Medications Ordered in ED Medications  diltiazem (CARDIZEM) 1 mg/mL load via infusion 15 mg (15 mg Intravenous Bolus from Bag 01/02/17 1010)    And  diltiazem (CARDIZEM) 100 mg in dextrose 5% 193mL (1 mg/mL) infusion (0 mg/hr Intravenous Stopped 01/02/17 1214)  0.9 %  sodium chloride infusion ( Intravenous Stopped 01/02/17 1214)     Initial Impression / Assessment and Plan / ED Course  I have reviewed the triage vital signs and the nursing notes.  Pertinent labs & imaging results that were available during my care of the patient were reviewed by me and considered in my medical decision making (see chart for details).      Final Clinical Impressions(s) / ED Diagnoses   Final  diagnoses:  Atrial fibrillation with rapid  ventricular response (HCC)  Other microscopic hematuria   Patient has history of paroxysmal atrial fibrillation. Symptoms started acutely this morning. Patient took an additional dose of flex at night in her a.m. Cardizem without relief of symptoms. The patient has spontaneous conversion to sinus rhythm after bolus of Cardizem. Patient is chronically anticoagulated on Eliquis and sees cardiology. As been asymptomatic now and 2 sets of cardiac enzymes are negative. I feel she is stable for discharge. Patient's only symptoms positive on review systems was some chills and mild malaise for 2 days. Patient has hematuria. Given symptoms, will empirically treat with Keflex for UTI and obtain urine culture. Patient is aware necessity of following up for the hematuria. New Prescriptions New Prescriptions   CEPHALEXIN (KEFLEX) 500 MG CAPSULE    2 caps po bid x 7 days     Charlesetta Shanks, MD 01/02/17 1254

## 2017-01-02 NOTE — ED Triage Notes (Signed)
Pt here for syncope today; pt husband lowered her to floor; pt sts chills x 2 days and hx of afib

## 2017-01-03 LAB — URINE CULTURE: Culture: <10000 — AB

## 2017-01-04 ENCOUNTER — Ambulatory Visit (HOSPITAL_BASED_OUTPATIENT_CLINIC_OR_DEPARTMENT_OTHER)
Admission: RE | Admit: 2017-01-04 | Discharge: 2017-01-04 | Disposition: A | Discharge status: Home / Self Care | Payer: PPO | Source: Ambulatory Visit | Attending: Nurse Practitioner | Admitting: Nurse Practitioner

## 2017-01-04 ENCOUNTER — Emergency Department (HOSPITAL_COMMUNITY)
Admission: EM | Admit: 2017-01-04 | Discharge: 2017-01-04 | Disposition: A | Discharge status: Home / Self Care | Payer: PPO | Attending: Emergency Medicine | Admitting: Emergency Medicine

## 2017-01-04 ENCOUNTER — Other Ambulatory Visit: Payer: Self-pay

## 2017-01-04 ENCOUNTER — Encounter (HOSPITAL_COMMUNITY): Payer: Self-pay | Admitting: Nurse Practitioner

## 2017-01-04 ENCOUNTER — Telehealth: Payer: Self-pay | Admitting: Cardiovascular Disease

## 2017-01-04 ENCOUNTER — Emergency Department (HOSPITAL_COMMUNITY): Payer: PPO

## 2017-01-04 VITALS — BP 98/72 | HR 144 | Temp 98.9°F | Ht 65.0 in | Wt 138.0 lb

## 2017-01-04 DIAGNOSIS — I483 Typical atrial flutter: Secondary | ICD-10-CM | POA: Diagnosis not present

## 2017-01-04 DIAGNOSIS — I340 Nonrheumatic mitral (valve) insufficiency: Secondary | ICD-10-CM

## 2017-01-04 DIAGNOSIS — N189 Chronic kidney disease, unspecified: Secondary | ICD-10-CM | POA: Insufficient documentation

## 2017-01-04 DIAGNOSIS — Z7901 Long term (current) use of anticoagulants: Secondary | ICD-10-CM

## 2017-01-04 DIAGNOSIS — Z888 Allergy status to other drugs, medicaments and biological substances status: Secondary | ICD-10-CM | POA: Insufficient documentation

## 2017-01-04 DIAGNOSIS — I959 Hypotension, unspecified: Secondary | ICD-10-CM

## 2017-01-04 DIAGNOSIS — I480 Paroxysmal atrial fibrillation: Secondary | ICD-10-CM

## 2017-01-04 DIAGNOSIS — Z820 Family history of epilepsy and other diseases of the nervous system: Secondary | ICD-10-CM | POA: Insufficient documentation

## 2017-01-04 DIAGNOSIS — Z79899 Other long term (current) drug therapy: Secondary | ICD-10-CM

## 2017-01-04 DIAGNOSIS — I4891 Unspecified atrial fibrillation: Secondary | ICD-10-CM | POA: Diagnosis not present

## 2017-01-04 DIAGNOSIS — E785 Hyperlipidemia, unspecified: Secondary | ICD-10-CM | POA: Insufficient documentation

## 2017-01-04 DIAGNOSIS — I341 Nonrheumatic mitral (valve) prolapse: Secondary | ICD-10-CM | POA: Insufficient documentation

## 2017-01-04 DIAGNOSIS — I4892 Unspecified atrial flutter: Secondary | ICD-10-CM

## 2017-01-04 DIAGNOSIS — Z87442 Personal history of urinary calculi: Secondary | ICD-10-CM | POA: Insufficient documentation

## 2017-01-04 DIAGNOSIS — R000 Tachycardia, unspecified: Secondary | ICD-10-CM | POA: Diagnosis not present

## 2017-01-04 DIAGNOSIS — Z800 Family history of malignant neoplasm of digestive organs: Secondary | ICD-10-CM

## 2017-01-04 DIAGNOSIS — R55 Syncope and collapse: Secondary | ICD-10-CM | POA: Insufficient documentation

## 2017-01-04 DIAGNOSIS — Z9889 Other specified postprocedural states: Secondary | ICD-10-CM | POA: Insufficient documentation

## 2017-01-04 DIAGNOSIS — I4902 Ventricular flutter: Secondary | ICD-10-CM | POA: Diagnosis not present

## 2017-01-04 LAB — CBC
HCT: 40.6 % (ref 36.0–46.0)
HEMOGLOBIN: 13.9 g/dL (ref 12.0–15.0)
MCH: 30 pg (ref 26.0–34.0)
MCHC: 34.2 g/dL (ref 30.0–36.0)
MCV: 87.5 fL (ref 78.0–100.0)
Platelets: 164 10*3/uL (ref 150–400)
RBC: 4.64 MIL/uL (ref 3.87–5.11)
RDW: 12.6 % (ref 11.5–15.5)
WBC: 4.6 10*3/uL (ref 4.0–10.5)

## 2017-01-04 LAB — BASIC METABOLIC PANEL
Anion gap: 9 (ref 5–15)
BUN: 13 mg/dL (ref 6–20)
CHLORIDE: 100 mmol/L — AB (ref 101–111)
CO2: 25 mmol/L (ref 22–32)
Calcium: 8.4 mg/dL — ABNORMAL LOW (ref 8.9–10.3)
Creatinine, Ser: 0.84 mg/dL (ref 0.44–1.00)
GFR calc Af Amer: >60 mL/min (ref >60)
GFR calc non Af Amer: >60 mL/min (ref >60)
GLUCOSE: 113 mg/dL — AB (ref 65–99)
POTASSIUM: 3.7 mmol/L (ref 3.5–5.1)
Sodium: 134 mmol/L — ABNORMAL LOW (ref 135–145)

## 2017-01-04 LAB — I-STAT TROPONIN, ED: TROPONIN I, POC: 0 ng/mL (ref 0.00–0.08)

## 2017-01-04 LAB — INFLUENZA PANEL BY PCR (TYPE A & B)
Influenza A By PCR: NEGATIVE
Influenza B By PCR: NEGATIVE

## 2017-01-04 MED ORDER — PROPOFOL 10 MG/ML IV BOLUS
1.0000 mg/kg | Freq: Once | INTRAVENOUS | Status: DC
  Filled 2017-01-04: qty 20

## 2017-01-04 MED ORDER — DILTIAZEM HCL-DEXTROSE 100-5 MG/100ML-% IV SOLN (PREMIX)
5.0000 mg/h | INTRAVENOUS | Status: DC
  Administered 2017-01-04: 5 mg/h via INTRAVENOUS
  Filled 2017-01-04: qty 100

## 2017-01-04 MED ORDER — DILTIAZEM HCL 30 MG PO TABS: ORAL_TABLET | ORAL | 1 refills | Status: DC

## 2017-01-04 MED ORDER — SODIUM CHLORIDE 0.9 % IV BOLUS (SEPSIS)
500.0000 mL | Freq: Once | INTRAVENOUS | Status: AC
  Administered 2017-01-04: 500 mL via INTRAVENOUS

## 2017-01-04 MED ORDER — DILTIAZEM LOAD VIA INFUSION
15.0000 mg | Freq: Once | INTRAVENOUS | Status: AC
  Administered 2017-01-04: 15 mg via INTRAVENOUS
  Filled 2017-01-04: qty 15

## 2017-01-04 MED ORDER — FLECAINIDE ACETATE 50 MG PO TABS: 100.0000 mg | ORAL_TABLET | Freq: Two times a day (BID) | ORAL | 1 refills | Status: DC

## 2017-01-04 MED ORDER — PROPOFOL 10 MG/ML IV BOLUS
INTRAVENOUS | Status: AC | PRN
  Administered 2017-01-04: 30 mg via INTRAVENOUS

## 2017-01-04 NOTE — ED Triage Notes (Signed)
Pt from afib clinic, was seen tuesday for aflutter 140s, converted with diltiazem, pt seen today for weakness sob, pt HR 135 aflutter again. Pt sent here for cardioversion.

## 2017-01-04 NOTE — Addendum Note (Signed)
Encounter addended by: Sherran Needs, NP on: 01/04/2017  4:42 PM<BR>    Actions taken: LOS modified

## 2017-01-04 NOTE — Progress Notes (Signed)
Primary Care Physician: Jerlyn Ly, MD Referring Physician: [address] triage   [name] is a 76 y.o. female with a h/o paroxysmal afib that has been quiet on flecainide for years. On Tuesday, she woke up in afib and had a syncopal episode and husband( retired Presenter, broadcasting) brought her to the ER where she was found to be in aflutter with RVR, given IV dilt and converted. Pt has not felt well for the last few days with some mild chills/fevers. Her urine loolked suspisous for a UTI and she was started on Keflex.Urine culture shows insignificant growth. colonies. She woke up again this am was in rapid afib with a HR of 140-150. She called [employer] triage and was triaged here. Her BP in the office is 98/72 with a HR of 144 bpm. Pt feels weak. Her husband still questions if she may have the flu and is asking for a swab. He says that she has been not felt good for the last few days. She did take an extra flecainide prior to leaving home, total of 100 mg, usually takes 50 mg bid.  Today, she denies symptoms of   chest pain, shortness of breath, orthopnea, PND, lower extremity edema, dizziness, presyncope, syncope, or neurologic sequela.Positive for weakness, prior syncope with rvr on Tuesday. The patient is tolerating medications without difficulties and is otherwise without complaint today.   Past Medical History:  Diagnosis Date  . Abnormal Pap smear   . Atrial fibrillation [facility])    Very rare episodes  / episode March, 2011  CHADS score O  . Chronic kidney disease    hx of kidney stones  . Drug therapy    Flecainide therapy started March, 2011, treadmill March, 2011, no exercise-induced ectopy  . Ejection fraction    EF 55-65%, echo, October, 2009  . Hyperlipidemia   . Kidney stone   . Mitral regurgitation    Mild, echo, October, 2009  . MVP (mitral valve prolapse)    per pt "mild"   Past Surgical History:  Procedure Laterality Date  . COLONOSCOPY    .  DILATATION & CURRETTAGE/HYSTEROSCOPY WITH RESECTOCOPE  08/20/2012   Procedure: [place];  Surgeon: Lyman Speller, MD;  Location: [facility];  Service: Gynecology;  Laterality: N/A;  . GYNECOLOGIC CRYOSURGERY  1980's  . HYSTEROSCOPY  11/3   polyp resection  . TONSILLECTOMY AND ADENOIDECTOMY    . varicose vein ablation     left    No current facility-administered medications for this encounter.    Current Outpatient Prescriptions  Medication Sig Dispense Refill  . acetaminophen (TYLENOL) 500 MG tablet Take 500-1,000 mg by mouth every 6 (six) hours as needed for moderate pain or headache.    Marland Kitchen apixaban (ELIQUIS) 5 MG TABS tablet Take 1 tablet (5 mg total) by mouth 2 (two) times daily. 180 tablet 3  . B Complex Vitamins (VITAMIN B COMPLEX) TABS Take 1 tablet by mouth at bedtime.    Marland Kitchen CARTIA XT 120 MG 24 hr capsule TAKE 1 CAPSULE BY MOUTH  ONCE A DAY 90 capsule 1  . cephALEXin (KEFLEX) 500 MG capsule 2 caps po bid x 7 days 28 capsule 0  . estradiol (ESTRING) 2 MG vaginal ring Place 2 mg vaginally every 3 (three) months. 1 each 4  . fish oil-omega-3 fatty acids 1000 MG capsule Take 1 g by mouth at bedtime.     . flecainide (TAMBOCOR) 50 MG tablet Take 2 tablets (  100 mg total) by mouth every 12 (twelve) hours. 180 tablet 1  . ibuprofen (ADVIL,MOTRIN) 200 MG tablet Take 600 mg by mouth every 4 (four) hours as needed for headache or moderate pain.    . Magnesium 500 MG TABS Take 1 tablet by mouth every other day. In PM    . Multiple Vitamin (MULTIVITAMIN) capsule Take 1 capsule by mouth at bedtime.     Marland Kitchen PREVIDENT 5000 BOOSTER PLUS 1.1 % PSTE APPLY PEA SIZE AMOUNT ONTO BRUSH AND APPLY TO ALL TEETH SURFACE X2 MINS ONCE DAILY AND EXPECTORATE  4  . simvastatin (ZOCOR) 10 MG tablet Take 10 mg by mouth daily.    . Vitamin D, Ergocalciferol, (DRISDOL) 50000 units CAPS capsule TAKE ONE CAPSULE BY MOUTH EVERY 7 DAYS 12 capsule 4  . diltiazem (CARDIZEM) 30 MG  tablet Take 1 tablet every 4 hours AS NEEDED for rapid heart rate over 100 as long as blood pressure >100. 45 tablet 1   Facility-Administered Medications Ordered in Other Encounters  Medication Dose Route Frequency Provider Last Rate Last Dose  . diltiazem (CARDIZEM) 100 mg in dextrose 5% 118mL (1 mg/mL) infusion  5-15 mg/hr Intravenous Continuous Dorie Rank, MD 15 mL/hr at 01/04/17 1300 15 mg/hr at 01/04/17 1300    Allergies  Allergen Reactions  . Tetanus Toxoid     Fever, swelling at site    Social History   Social History  . Marital status: Married    Spouse name: N/A  . Number of children: N/A  . Years of education: N/A   Occupational History  . Not on file.   Social History Main Topics  . Smoking status: Never Smoker  . Smokeless tobacco: Never Used  . Alcohol use 1.2 - 1.8 oz/week    1 - 2 Standard drinks or equivalent, 1 Glasses of wine per week     Comment: socially  . Drug use: No  . Sexual activity: Yes    Partners: Male    Birth control/ protection: Post-menopausal   Other Topics Concern  . Not on file   Social History Narrative  . No narrative on file    Family History  Problem Relation Age of Onset  . Parkinsonism Father   . Colon cancer Neg Hx   . Esophageal cancer Neg Hx   . Rectal cancer Neg Hx   . Stomach cancer Neg Hx     ROS- All systems are reviewed and negative except as per the HPI above  Physical Exam: Vitals:   01/04/17 1026  BP: 98/72  Pulse: (!) 144  Temp: 98.9 F (37.2 C)  Weight: 138 lb (62.6 kg)  Height: 5\' 5"  (1.651 m)   Wt Readings from Last 3 Encounters:  01/04/17 138 lb (62.6 kg)  07/06/16 147 lb (66.7 kg)  11/29/15 143 lb 6.4 oz (65 kg)    Labs: Lab Results  Component Value Date   NA 134 (L) 01/04/2017   K 3.7 01/04/2017   CL 100 (L) 01/04/2017   CO2 25 01/04/2017   GLUCOSE 113 (H) 01/04/2017   BUN 13 01/04/2017   CREATININE 0.84 01/04/2017   CALCIUM 8.4 (L) 01/04/2017   MG 2.2 01/02/2017   Lab Results   Component Value Date   INR 1.33 01/02/2017   No results found for: CHOL, HDL, LDLCALC, TRIG   GEN- The patient is well appearing, alert and oriented x 3 today.   Head- normocephalic, atraumatic Eyes-  Sclera clear, conjunctiva pink Ears- hearing intact Oropharynx-  clear Neck- supple, no JVP Lymph- no cervical lymphadenopathy Lungs- Clear to ausculation bilaterally, normal work of breathing Heart- Rapid irregular rate and rhythm, no murmurs, rubs or gallops, PMI not laterally displaced GI- soft, NT, ND, + BS Extremities- no clubbing, cyanosis, or edema MS- no significant deformity or atrophy Skin- no rash or lesion Psych- euthymic mood, full affect Neuro- strength and sensation are intact  EKG-A flutter with RVR at 144 bpm, qrs itn 84 ms, qtc 520 ms, EKG reads  stemi but does not fit clinical picture. ST changes from flutter. Previous EKG's  in SR show normal intervals.    Assessment and Plan: 1. Aflutter with rapid RVR with hypotension and recent syncope Her BP poses a problem with being able to increase rate control and send her home, especially with syncope 2 days ago Extra flecainide this am has not converted pt I spoke with Dr. Curt Bears and he feels the best plan is to send her to the ER for cardioversion. I feel with recheck BP of 90/60, she may not  be able to tolerate a dilt drip. She was advised once she converts to SR, increase flecainide to 100 mg bid and return on Monday for repeat EKG. She also needs echo updated. If she has further breakthrough, an ablation or change of antiarrythmic may be indicated 30 mg dilt was also given for prn breakthrough episodes if sys BP over 100 and HR over 100 Husband is also concerned she may have the flu although clinically does not appear to be acutely ill and wants a flu swab done in the ER.  Pt to ER in w/c  Butch Penny C. [facility] [address] [place], [place] [address] [phone]   Pt  sent to ER via W/C

## 2017-01-04 NOTE — Telephone Encounter (Signed)
New Message:     Pt is in Atrial Fib and fainting.

## 2017-01-04 NOTE — Patient Instructions (Signed)
Your physician has recommended you make the following change in your medication:  1)Increase Flecainide to 100mg  twice a day 2)Cardizem 30mg  -- take 1 tablet every 4 hours AS NEEDED for rapid heart rate over 100 as long as blood pressure on the top is over 100.

## 2017-01-04 NOTE — Discharge Instructions (Signed)
Follow-up with your cardiologist as previously instructed

## 2017-01-04 NOTE — Telephone Encounter (Signed)
Spoke with husband, DPR on file.  States pt had Afib 2 yrs ago but has not had any problems associated with it since then until Tuesday.  Pt fainted Tuesday, no injuries as husband was able to catch her.  Went to ER at that time and pt converted back to NSR.  Pt woke up this morning feeling weak and husband checked HR and it was 148.  Pt took extra flecanide and took cardizem this AM.  Spoke with Melina Copa, PA-C and she suggested Afib Clinic.  Spoke with Estill Bamberg at [facility] and scheduled pt to be seen at 10:30AM.  Called husband back and advised him of appt.  Husband in agreement with this time and very appreciative for assistance. Will route to Dr. Acie Fredrickson and his nurse to make them aware.

## 2017-01-04 NOTE — ED Provider Notes (Signed)
[facility] DEPT Provider Note   CSN: [id number] Arrival date & time: 01/04/17  1136     History   Chief Complaint Chief Complaint  Patient presents with  . Tachycardia    HPI [name] is a 76 y.o. female.  HPI Patient presents to the emergency room for evaluation of recurrent atrial flutter with tachycardia.  Patient has a history of paroxysmal atrial fibrillation, atrial flutter. She recently had an exacerbation and was seen in the emergency room on April 3.  On the third she had gotten up to go to the bathroom and felt her heart racing. She felt suddenly weak when this happened and slumped to the side. Her husband had to help her to the ground. The patient was brought into the emergency room was found to be in atrial flutter with a rapid rate. Patient was started on a Cardizem infusion and she went back into a sinus rhythm.  Patient's had some general malaise associated with a URI but otherwise has been doing well. She was started on antibiotics for possible UTI although according to the report the culture was negative. This morning the patient felt herself go back into atrial fibrillation with a rapid rate. She went to the cardiac A. fib clinic today. They noted her heart rate was 144. Her blood pressure was 98/72.  There are no notes in the chart and I was not called by the cardiology clinic but the patient states she was sent to the emergency room to be cardioverted which certainly sounds clinically appropriate considering her condition. Past Medical History:  Diagnosis Date  . Abnormal Pap smear   . Atrial fibrillation [facility])    Very rare episodes  / episode March, 2011  CHADS score O  . Chronic kidney disease    hx of kidney stones  . Drug therapy    Flecainide therapy started March, 2011, treadmill March, 2011, no exercise-induced ectopy  . Ejection fraction    EF 55-65%, echo, October, 2009  . Hyperlipidemia   . Kidney stone   . Mitral regurgitation    Mild, echo,  October, 2009  . MVP (mitral valve prolapse)    per pt "mild"    Patient Active Problem List   Diagnosis Date Noted  . Abdominal pain in pregnancy, antepartum 02/17/2015  . Acute left lower quadrant pain 02/17/2015  . CN (constipation) 02/17/2015  . Varicose veins 08/03/2014  . Spider veins of both lower extremities 08/03/2014  . Varicose veins of lower extremities with other complications 76/73/4193  . Hyperlipidemia   . Drug therapy   . Atrial fibrillation ([place])   . Ejection fraction   . Mitral regurgitation     Past Surgical History:  Procedure Laterality Date  . COLONOSCOPY    . DILATATION & CURRETTAGE/HYSTEROSCOPY WITH RESECTOCOPE  08/20/2012   Procedure: [place];  Surgeon: Lyman Speller, MD;  Location: [facility];  Service: Gynecology;  Laterality: N/A;  . GYNECOLOGIC CRYOSURGERY  1980's  . HYSTEROSCOPY  11/3   polyp resection  . TONSILLECTOMY AND ADENOIDECTOMY    . varicose vein ablation     left    OB History    Gravida Para Term Preterm AB Living   2 2 2     2    SAB TAB Ectopic Multiple Live Births                   Home Medications    Prior to Admission medications   Medication  Sig Start Date End Date Taking? Authorizing Provider  acetaminophen (TYLENOL) 500 MG tablet Take 500-1,000 mg by mouth every 6 (six) hours as needed for moderate pain or headache.   Yes Historical Provider, MD  apixaban (ELIQUIS) 5 MG TABS tablet Take 1 tablet (5 mg total) by mouth 2 (two) times daily. 11/29/15  Yes Thayer Headings, MD  B Complex Vitamins (VITAMIN B COMPLEX) TABS Take 1 tablet by mouth at bedtime.   Yes Historical Provider, MD  CARTIA XT 120 MG 24 hr capsule TAKE 1 CAPSULE BY MOUTH  ONCE A DAY 09/04/16  Yes Thayer Headings, MD  cephALEXin (KEFLEX) 500 MG capsule 2 caps po bid x 7 days Patient taking differently: Take 1,000 mg by mouth 2 (two) times daily. FOR 7 DAYS 01/02/17  Yes Charlesetta Shanks, MD  diltiazem (CARDIZEM) 30  MG tablet Take 1 tablet every 4 hours AS NEEDED for rapid heart rate over 100 as long as blood pressure >100. 01/04/17  Yes Sherran Needs, NP  estradiol (ESTRING) 2 MG vaginal ring Place 2 mg vaginally every 3 (three) months. 11/19/15  Yes Megan Salon, MD  fish oil-omega-3 fatty acids 1000 MG capsule Take 1 g by mouth at bedtime.    Yes Historical Provider, MD  flecainide (TAMBOCOR) 50 MG tablet Take 2 tablets (100 mg total) by mouth every 12 (twelve) hours. 01/04/17  Yes Sherran Needs, NP  Magnesium 500 MG TABS Take 1 tablet by mouth every other day. In PM   Yes Historical Provider, MD  Multiple Vitamin (MULTIVITAMIN) capsule Take 1 capsule by mouth at bedtime.    Yes Historical Provider, MD  PREVIDENT 5000 BOOSTER PLUS 1.1 % PSTE APPLY PEA SIZE AMOUNT ONTO BRUSH AND APPLY TO ALL TEETH SURFACE X2 MINS ONCE DAILY AND EXPECTORATE 10/25/15  Yes Historical Provider, MD  simvastatin (ZOCOR) 10 MG tablet Take 10 mg by mouth daily. 05/16/16  Yes Historical Provider, MD  Vitamin D, Ergocalciferol, (DRISDOL) 50000 units CAPS capsule TAKE ONE CAPSULE BY MOUTH EVERY 7 DAYS 11/19/15  Yes Megan Salon, MD    Family History Family History  Problem Relation Age of Onset  . Parkinsonism Father   . Colon cancer Neg Hx   . Esophageal cancer Neg Hx   . Rectal cancer Neg Hx   . Stomach cancer Neg Hx     Social History Social History  Substance Use Topics  . Smoking status: Never Smoker  . Smokeless tobacco: Never Used  . Alcohol use 1.2 - 1.8 oz/week    1 - 2 Standard drinks or equivalent, 1 Glasses of wine per week     Comment: socially     Allergies   Tetanus toxoid   Review of Systems Review of Systems  All other systems reviewed and are negative.    Physical Exam Updated Vital Signs BP 108/68   Pulse 91   Temp 99.6 F (37.6 C) (Oral)   Resp 13   LMP 10/02/1997   SpO2 100%   Physical Exam  Constitutional: She appears well-developed and well-nourished. No distress.  HENT:  Head:  Normocephalic and atraumatic.  Right Ear: External ear normal.  Left Ear: External ear normal.  Eyes: Conjunctivae are normal. Right eye exhibits no discharge. Left eye exhibits no discharge. No scleral icterus.  Neck: Neck supple. No tracheal deviation present.  Cardiovascular: Intact distal pulses.  An irregularly irregular rhythm present. Tachycardia present.   Pulmonary/Chest: Effort normal and breath sounds normal. No stridor. No  respiratory distress. She has no wheezes. She has no rales.  Abdominal: Soft. Bowel sounds are normal. She exhibits no distension. There is no tenderness. There is no rebound and no guarding.  Musculoskeletal: She exhibits no edema or tenderness.  Neurological: She is alert. She has normal strength. No cranial nerve deficit (no facial droop, extraocular movements intact, no slurred speech) or sensory deficit. She exhibits normal muscle tone. She displays no seizure activity. Coordination normal.  Skin: Skin is warm and dry. No rash noted.  Psychiatric: She has a normal mood and affect.  Nursing note and vitals reviewed.    ED Treatments / Results  Labs (all labs ordered are listed, but only abnormal results are displayed) Labs Reviewed  BASIC METABOLIC PANEL - Abnormal; Notable for the following:       Result Value   Sodium 134 (*)    Chloride 100 (*)    Glucose, Bld 113 (*)    Calcium 8.4 (*)    All other components within normal limits  CBC  INFLUENZA PANEL BY PCR (TYPE A & B)  I-STAT TROPOININ, ED    EKG  EKG Interpretation  Date/Time:  Thursday January 04 2017 11:40:49 EDT Ventricular Rate:  141 PR Interval:    QRS Duration: 94 QT Interval:  369 QTC Calculation: 566 R Axis:   64 Text Interpretation:  Atrial flutter with predominant 2:1 AV block possible inferior infarct, acute,  Prolonged QT interval Probable RV involvement, suggest recording right precordial leads similar to prior ECG, suspect rate related changes Confirmed by Januel Doolan  MD-J,  Gregoria Selvy ([id number]) on 01/04/2017 11:46:59 AM       Radiology Dg Chest Portable 1 View  Result Date: 01/04/2017 CLINICAL DATA:  Atrial fibrillation EXAM: PORTABLE CHEST 1 VIEW COMPARISON:  January 02, 2017 FINDINGS: There is slight apical pleural thickening bilaterally. There is no edema or consolidation. The heart size and pulmonary vascularity are normal. No adenopathy. No bone lesions. IMPRESSION: No edema or consolidation. Electronically Signed   By: Lowella Grip III M.D.   On: 01/04/2017 12:22    Procedures .Cardioversion Date/Time: 01/04/2017 3:00 PM Performed by: Dorie Rank Authorized by: Dorie Rank   Consent:    Consent obtained:  Written   Consent given by:  Patient   Risks discussed:  Induced arrhythmia   Alternatives discussed:  Rate-control medication, no treatment and alternative treatment Pre-procedure details:    Cardioversion basis:  Emergent   Rhythm:  Atrial flutter   Electrode placement:  Anterior-posterior Attempt one:    Cardioversion mode:  Synchronous   Waveform:  Biphasic   Shock (Joules):  200   Shock outcome:  Conversion to normal sinus rhythm Post-procedure details:    Patient status:  Alert   Patient tolerance of procedure:  Tolerated well, no immediate complications .Sedation Date/Time: 01/04/2017 3:01 PM Performed by: Dorie Rank Authorized by: Dorie Rank   Consent:    Consent obtained:  Written   Consent given by:  Patient   Risks discussed:  Inadequate sedation, vomiting and respiratory compromise necessitating ventilatory assistance and intubation   Alternatives discussed:  Analgesia without sedation Indications:    Procedure performed:  Cardioversion   Procedure necessitating sedation performed by:  Physician performing sedation   Intended level of sedation:  Deep Pre-sedation assessment:    Time since last food or drink:  4 hr   ASA classification: class 1 - normal, healthy patient     Neck mobility: normal     Mouth opening:  3 or  more finger  widths   Thyromental distance:  4 finger widths   Mallampati score:  I - soft palate, uvula, fauces, pillars visible   Pre-sedation assessments completed and reviewed: airway patency, cardiovascular function, hydration status, mental status, nausea/vomiting, pain level, respiratory function and temperature     History of difficult intubation: no     Pre-sedation assessment completed:  01/04/2017 2:17 PM Immediate pre-procedure details:    Reassessment: Patient reassessed immediately prior to procedure     Reviewed: vital signs, relevant labs/tests and NPO status     Verified: bag valve mask available, emergency equipment available, intubation equipment available, IV patency confirmed, oxygen available, reversal medications available and suction available   Procedure details (see MAR for exact dosages):    Sedation start time:  01/04/2017 2:32 PM   Preoxygenation:  Nasal cannula   Sedation:  Propofol   Intra-procedure monitoring:  Blood pressure monitoring, continuous pulse oximetry, cardiac monitor, frequent LOC assessments and frequent vital sign checks   Intra-procedure events: none     Sedation end time:  01/04/2017 2:52 PM Post-procedure details:    Post-sedation assessment completed:  01/04/2017 3:02 PM   Estimated blood loss (see I/O flowsheets): no     Post-sedation assessments completed and reviewed: airway patency, cardiovascular function, hydration status, mental status, nausea/vomiting, pain level, respiratory function and temperature     Specimens recovered:  None   Patient is stable for discharge or admission: yes     Patient tolerance:  Tolerated well, no immediate complications   (including critical care time)  Medications Ordered in ED Medications  diltiazem (CARDIZEM) 1 mg/mL load via infusion 15 mg (15 mg Intravenous Bolus from Bag 01/04/17 1210)    And  diltiazem (CARDIZEM) 100 mg in dextrose 5% 179mL (1 mg/mL) infusion (0 mg/hr Intravenous Stopped 01/04/17 1350)  propofol  (DIPRIVAN) 10 mg/mL bolus/IV push 62.6 mg (not administered)  sodium chloride 0.9 % bolus 500 mL (0 mLs Intravenous Stopped 01/04/17 1347)  propofol (DIPRIVAN) 10 mg/mL bolus/IV push (30 mg Intravenous Given 01/04/17 1353)     Initial Impression / Assessment and Plan / ED Course  I have reviewed the triage vital signs and the nursing notes.  Pertinent labs & imaging results that were available during my care of the patient were reviewed by me and considered in my medical decision making (see chart for details).  Clinical Course as of Jan 04 1502  Thu Jan 04, 2017  1159 Family requests a flu test.  Pt also requested we try cardizem rather than cardioversion  [JK]    Clinical Course User Index [JK] Dorie Rank, MD   Patient appears hemodynamically stable. She does not want to be cardioverted right now elicits absolutely necessary. She is concerned about some of the potential risks. Patient states the last time and she responded to Cardizem in hopes we can try that again. Patient's blood pressure is now 114/78. When she was seen in the ED the other day showed a similar blood pressure. I think it's reasonable to try Cardizem as  she requests.  Patient did not respond to Cardizem treatment. She underwent successful synchronized.  patient was instructed to change her flecainide dose. She was also given a prescription for Cardizem by her cardiologist.  Patient is stable for discharge and she has plans for outpatient follow-up.  Final Clinical Impressions(s) / ED Diagnoses   Final diagnoses:  Atrial flutter with rapid ventricular response (HCC)      Dorie Rank, MD 01/04/17 1504

## 2017-01-05 NOTE — Telephone Encounter (Signed)
Events noted. Agree with afib clinic

## 2017-01-08 ENCOUNTER — Ambulatory Visit (HOSPITAL_COMMUNITY)
Admission: RE | Admit: 2017-01-08 | Discharge: 2017-01-08 | Disposition: A | Discharge status: Home / Self Care | Payer: PPO | Source: Ambulatory Visit | Attending: Nurse Practitioner | Admitting: Nurse Practitioner

## 2017-01-08 ENCOUNTER — Encounter (HOSPITAL_COMMUNITY): Payer: Self-pay | Admitting: Nurse Practitioner

## 2017-01-08 VITALS — BP 116/82 | HR 76 | Ht 65.0 in | Wt 142.8 lb

## 2017-01-08 DIAGNOSIS — I340 Nonrheumatic mitral (valve) insufficiency: Secondary | ICD-10-CM | POA: Diagnosis not present

## 2017-01-08 DIAGNOSIS — Z7901 Long term (current) use of anticoagulants: Secondary | ICD-10-CM | POA: Diagnosis not present

## 2017-01-08 DIAGNOSIS — N189 Chronic kidney disease, unspecified: Secondary | ICD-10-CM | POA: Diagnosis not present

## 2017-01-08 DIAGNOSIS — Z1389 Encounter for screening for other disorder: Secondary | ICD-10-CM | POA: Diagnosis not present

## 2017-01-08 DIAGNOSIS — I483 Typical atrial flutter: Secondary | ICD-10-CM | POA: Diagnosis not present

## 2017-01-08 DIAGNOSIS — I4892 Unspecified atrial flutter: Secondary | ICD-10-CM | POA: Diagnosis not present

## 2017-01-08 DIAGNOSIS — E785 Hyperlipidemia, unspecified: Secondary | ICD-10-CM | POA: Insufficient documentation

## 2017-01-08 DIAGNOSIS — Z9889 Other specified postprocedural states: Secondary | ICD-10-CM | POA: Insufficient documentation

## 2017-01-08 DIAGNOSIS — Z79899 Other long term (current) drug therapy: Secondary | ICD-10-CM | POA: Insufficient documentation

## 2017-01-08 DIAGNOSIS — R55 Syncope and collapse: Secondary | ICD-10-CM | POA: Diagnosis not present

## 2017-01-08 DIAGNOSIS — Z6822 Body mass index (BMI) 22.0-22.9, adult: Secondary | ICD-10-CM | POA: Diagnosis not present

## 2017-01-08 DIAGNOSIS — B349 Viral infection, unspecified: Secondary | ICD-10-CM | POA: Diagnosis not present

## 2017-01-08 DIAGNOSIS — Z87442 Personal history of urinary calculi: Secondary | ICD-10-CM | POA: Diagnosis not present

## 2017-01-08 DIAGNOSIS — I480 Paroxysmal atrial fibrillation: Secondary | ICD-10-CM | POA: Insufficient documentation

## 2017-01-08 DIAGNOSIS — Z820 Family history of epilepsy and other diseases of the nervous system: Secondary | ICD-10-CM | POA: Insufficient documentation

## 2017-01-08 DIAGNOSIS — R7301 Impaired fasting glucose: Secondary | ICD-10-CM | POA: Diagnosis not present

## 2017-01-08 DIAGNOSIS — Z888 Allergy status to other drugs, medicaments and biological substances status: Secondary | ICD-10-CM | POA: Diagnosis not present

## 2017-01-08 DIAGNOSIS — I341 Nonrheumatic mitral (valve) prolapse: Secondary | ICD-10-CM | POA: Insufficient documentation

## 2017-01-08 MED ORDER — FLECAINIDE ACETATE 100 MG PO TABS: 100.0000 mg | ORAL_TABLET | Freq: Two times a day (BID) | ORAL | 3 refills | Status: DC

## 2017-01-08 NOTE — Progress Notes (Signed)
Primary Care Physician: Jerlyn Ly, MD Referring Physician: [address] triage Cardiologist: Dr. Charleen Kirks [name] is a 76 y.o. female with a h/o paroxysmal afib that has been quiet on flecainide for years. On Tuesday, she woke up in afib and had a syncopal episode and husband( retired Presenter, broadcasting) brought her to the ER where she was found to be in aflutter with RVR, given IV dilt and converted. Pt has not felt well for the last few days with some mild chills/fevers. Her urine loolked suspisous for a UTI and she was started on Keflex.Urine culture shows insignificant growth. colonies. She woke up again this am was in rapid afib with a HR of 140-150. She called [facility] triage and was triaged here. Her BP in the office is 98/72 with a HR of 144 bpm. Pt feels weak. Her husband still questions if she may have the flu and is asking for a swab. He says that she has been not felt good for the last few days. She did take an extra flecainide prior to leaving home, total of 100 mg, usually takes 50 mg bid.  She was sent to the ER with soft BP in the 97'Q systolic along with RVR, with h/o syncope Tuesday when she had afib with RVR. Cardizem drip was initially tried, but ultimately had to have a cardioversion.  I had told her to increase flecainide to 100 mg bid upon leaving ER. She returns today with no further afib.  Tolerating the increase of flecainide well with no interval changes noted on EKG. She plans to go to [place] later this week. Her husband requested that she be swabbed for the flu while she was in the ER and was negative.  Today, she denies symptoms of   chest pain, shortness of breath, orthopnea, PND, lower extremity edema, dizziness, presyncope, syncope, or neurologic sequela.Positive for weakness, prior syncope with rvr on Tuesday. The patient is tolerating medications without difficulties and is otherwise without complaint today.   Past Medical History:    Diagnosis Date  . Abnormal Pap smear   . Atrial fibrillation [facility])    Very rare episodes  / episode March, 2011  CHADS score O  . Chronic kidney disease    hx of kidney stones  . Drug therapy    Flecainide therapy started March, 2011, treadmill March, 2011, no exercise-induced ectopy  . Ejection fraction    EF 55-65%, echo, October, 2009  . Hyperlipidemia   . Kidney stone   . Mitral regurgitation    Mild, echo, October, 2009  . MVP (mitral valve prolapse)    per pt "mild"   Past Surgical History:  Procedure Laterality Date  . COLONOSCOPY    . DILATATION & CURRETTAGE/HYSTEROSCOPY WITH RESECTOCOPE  08/20/2012   Procedure: [place];  Surgeon: Lyman Speller, MD;  Location: [facility];  Service: Gynecology;  Laterality: N/A;  . GYNECOLOGIC CRYOSURGERY  1980's  . HYSTEROSCOPY  11/3   polyp resection  . TONSILLECTOMY AND ADENOIDECTOMY    . varicose vein ablation     left    Current Outpatient Prescriptions  Medication Sig Dispense Refill  . acetaminophen (TYLENOL) 500 MG tablet Take 500-1,000 mg by mouth every 6 (six) hours as needed for moderate pain or headache.    Marland Kitchen apixaban (ELIQUIS) 5 MG TABS tablet Take 1 tablet (5 mg total) by mouth 2 (two) times daily. 180 tablet 3  . B Complex Vitamins (VITAMIN  B COMPLEX) TABS Take 1 tablet by mouth at bedtime.    Marland Kitchen CARTIA XT 120 MG 24 hr capsule TAKE 1 CAPSULE BY MOUTH  ONCE A DAY 90 capsule 1  . cephALEXin (KEFLEX) 500 MG capsule 2 caps po bid x 7 days (Patient taking differently: Take 1,000 mg by mouth 2 (two) times daily. FOR 7 DAYS) 28 capsule 0  . diltiazem (CARDIZEM) 30 MG tablet Take 1 tablet every 4 hours AS NEEDED for rapid heart rate over 100 as long as blood pressure >100. 45 tablet 1  . estradiol (ESTRING) 2 MG vaginal ring Place 2 mg vaginally every 3 (three) months. 1 each 4  . fish oil-omega-3 fatty acids 1000 MG capsule Take 1 g by mouth at bedtime.     . flecainide (TAMBOCOR)  100 MG tablet Take 1 tablet (100 mg total) by mouth every 12 (twelve) hours. 60 tablet 3  . Magnesium 500 MG TABS Take 1 tablet by mouth every other day. In PM    . Multiple Vitamin (MULTIVITAMIN) capsule Take 1 capsule by mouth at bedtime.     Marland Kitchen PREVIDENT 5000 BOOSTER PLUS 1.1 % PSTE APPLY PEA SIZE AMOUNT ONTO BRUSH AND APPLY TO ALL TEETH SURFACE X2 MINS ONCE DAILY AND EXPECTORATE  4  . simvastatin (ZOCOR) 10 MG tablet Take 10 mg by mouth daily.    . Vitamin D, Ergocalciferol, (DRISDOL) 50000 units CAPS capsule TAKE ONE CAPSULE BY MOUTH EVERY 7 DAYS 12 capsule 4   No current facility-administered medications for this encounter.     Allergies  Allergen Reactions  . Tetanus Toxoid Swelling and Other (See Comments)    Fever, swelling at site (BUT the patient states that she is willing to take it, if necessary)    Social History   Social History  . Marital status: Married    Spouse name: N/A  . Number of children: N/A  . Years of education: N/A   Occupational History  . Not on file.   Social History Main Topics  . Smoking status: Never Smoker  . Smokeless tobacco: Never Used  . Alcohol use 1.2 - 1.8 oz/week    1 - 2 Standard drinks or equivalent, 1 Glasses of wine per week     Comment: socially  . Drug use: No  . Sexual activity: Yes    Partners: Male    Birth control/ protection: Post-menopausal   Other Topics Concern  . Not on file   Social History Narrative  . No narrative on file    Family History  Problem Relation Age of Onset  . Parkinsonism Father   . Colon cancer Neg Hx   . Esophageal cancer Neg Hx   . Rectal cancer Neg Hx   . Stomach cancer Neg Hx     ROS- All systems are reviewed and negative except as per the HPI above  Physical Exam: Vitals:   01/08/17 0941  BP: 116/82  Pulse: 76  Weight: 142 lb 12.8 oz (64.8 kg)  Height: 5\' 5"  (1.651 m)   Wt Readings from Last 3 Encounters:  01/08/17 142 lb 12.8 oz (64.8 kg)  01/04/17 138 lb (62.6 kg)    07/06/16 147 lb (66.7 kg)    Labs: Lab Results  Component Value Date   NA 134 (L) 01/04/2017   K 3.7 01/04/2017   CL 100 (L) 01/04/2017   CO2 25 01/04/2017   GLUCOSE 113 (H) 01/04/2017   BUN 13 01/04/2017   CREATININE 0.84 01/04/2017  CALCIUM 8.4 (L) 01/04/2017   MG 2.2 01/02/2017   Lab Results  Component Value Date   INR 1.33 01/02/2017   No results found for: CHOL, HDL, LDLCALC, TRIG   GEN- The patient is well appearing, alert and oriented x 3 today.   Head- normocephalic, atraumatic Eyes-  Sclera clear, conjunctiva pink Ears- hearing intact Oropharynx- clear Neck- supple, no JVP Lymph- no cervical lymphadenopathy Lungs- Clear to ausculation bilaterally, normal work of breathing Heart- regular rate and rhythm, no murmurs, rubs or gallops, PMI not laterally displaced GI- soft, NT, ND, + BS Extremities- no clubbing, cyanosis, or edema MS- no significant deformity or atrophy Skin- no rash or lesion Psych- euthymic mood, full affect Neuro- strength and sensation are intact  EKG-NSR at 76 bpm, pr int 192 ms, qrs int 94 ms, qtc 450 ms  Previous EKG's  in SR show normal intervals.    Assessment and Plan: 1. Aflutter with rapid RVR with hypotension and recent syncope Treated in the ER on Tuesday with IV dilt and 4/6 with successful cardioversion Continue eliquis without fail Continue flecainide with new dose of 100 mg bid If continues with breakthrough afib may be an ablation candidate She has 30 mg cardizem to use as needed if sys BP over 100 and HR in afib over 100  F/u with Dr. Acie Fredrickson as scheduled 4/26     Geroge Baseman. [facility] [address] [place], [place] [address] [phone]   Pt sent to ER via W/C

## 2017-01-10 ENCOUNTER — Ambulatory Visit: Payer: PPO | Admitting: Physician Assistant

## 2017-01-19 ENCOUNTER — Other Ambulatory Visit (HOSPITAL_COMMUNITY): Payer: Self-pay | Admitting: *Deleted

## 2017-01-19 MED ORDER — APIXABAN 5 MG PO TABS: 5.0000 mg | ORAL_TABLET | Freq: Two times a day (BID) | ORAL | 2 refills | Status: DC

## 2017-01-25 ENCOUNTER — Encounter: Payer: Self-pay | Admitting: Cardiovascular Disease

## 2017-01-25 ENCOUNTER — Ambulatory Visit (INDEPENDENT_AMBULATORY_CARE_PROVIDER_SITE_OTHER): Payer: PPO | Admitting: Cardiovascular Disease

## 2017-01-25 VITALS — BP 100/68 | HR 66 | Ht 65.0 in | Wt 141.6 lb

## 2017-01-25 DIAGNOSIS — I480 Paroxysmal atrial fibrillation: Secondary | ICD-10-CM | POA: Diagnosis not present

## 2017-01-25 NOTE — Progress Notes (Signed)
Cardiology Office Note   Date:  01/25/2017   ID:  [name], DOB July 05, 1941, MRN [id number]  PCP:  Jerlyn Ly, MD  Cardiologist:   Mertie Moores, MD   (former Ron Parker patient )   Chief Complaint  Patient presents with  . Follow-up    PAF    Problem List 1. Paroxysmal atrial fib  2. Hyperlipidemia 3. Mitral valve prolapse with mild MR     [name] is a 76 y.o. female who presents to establish care. She has seen Dr. Ron Parker in the past. Has a hx of paroxysmal atrial fib   She has been stable on Flecainide  Oct. 5, 2017:  [name] is seen for follow up of her PAF  Spent the summer in [place]  Has been keeping up with her BP.  January 25, 2017  Seen for follow up of her atrial fib. Presented to the ER with rapid atrial fib.   Received IV Dilt and was cardioverted in the ER .  Was seen in the atrial fib clinic - Flecainide was increased to 100 mg BID  Has felt well since then  We discussed Afib ablation     Past Medical History:  Diagnosis Date  . Abnormal Pap smear   . Atrial fibrillation [facility])    Very rare episodes  / episode March, 2011  CHADS score O  . Chronic kidney disease    hx of kidney stones  . Drug therapy    Flecainide therapy started March, 2011, treadmill March, 2011, no exercise-induced ectopy  . Ejection fraction    EF 55-65%, echo, October, 2009  . Hyperlipidemia   . Kidney stone   . Mitral regurgitation    Mild, echo, October, 2009  . MVP (mitral valve prolapse)    per pt "mild"    Past Surgical History:  Procedure Laterality Date  . COLONOSCOPY    . DILATATION & CURRETTAGE/HYSTEROSCOPY WITH RESECTOCOPE  08/20/2012   Procedure: [place];  Surgeon: Lyman Speller, MD;  Location: [place] [facility];  Service: Gynecology;  Laterality: N/A;  . GYNECOLOGIC CRYOSURGERY  1980's  . HYSTEROSCOPY  11/3   polyp resection  . TONSILLECTOMY AND ADENOIDECTOMY    . varicose vein ablation     left      Current Outpatient Prescriptions  Medication Sig Dispense Refill  . acetaminophen (TYLENOL) 500 MG tablet Take 500-1,000 mg by mouth every 6 (six) hours as needed for moderate pain or headache.    Marland Kitchen apixaban (ELIQUIS) 5 MG TABS tablet Take 1 tablet (5 mg total) by mouth 2 (two) times daily. 180 tablet 2  . B Complex Vitamins (VITAMIN B COMPLEX) TABS Take 1 tablet by mouth at bedtime.    Marland Kitchen CARTIA XT 120 MG 24 hr capsule TAKE 1 CAPSULE BY MOUTH  ONCE A DAY 90 capsule 1  . cephALEXin (KEFLEX) 500 MG capsule 2 caps po bid x 7 days (Patient taking differently: Take 1,000 mg by mouth 2 (two) times daily. FOR 7 DAYS) 28 capsule 0  . diltiazem (CARDIZEM) 30 MG tablet Take 1 tablet every 4 hours AS NEEDED for rapid heart rate over 100 as long as blood pressure >100. 45 tablet 1  . estradiol (ESTRING) 2 MG vaginal ring Place 2 mg vaginally every 3 (three) months. 1 each 4  . fish oil-omega-3 fatty acids 1000 MG capsule Take 1 g by mouth at bedtime.     . flecainide (TAMBOCOR) 100 MG tablet Take  1 tablet (100 mg total) by mouth every 12 (twelve) hours. 60 tablet 3  . Magnesium 500 MG TABS Take 1 tablet by mouth every other day. In PM    . Multiple Vitamin (MULTIVITAMIN) capsule Take 1 capsule by mouth at bedtime.     Marland Kitchen PREVIDENT 5000 BOOSTER PLUS 1.1 % PSTE APPLY PEA SIZE AMOUNT ONTO BRUSH AND APPLY TO ALL TEETH SURFACE X2 MINS ONCE DAILY AND EXPECTORATE  4  . simvastatin (ZOCOR) 10 MG tablet Take 10 mg by mouth daily.    . Vitamin D, Ergocalciferol, (DRISDOL) 50000 units CAPS capsule TAKE ONE CAPSULE BY MOUTH EVERY 7 DAYS 12 capsule 4   No current facility-administered medications for this visit.     Allergies:   Tetanus toxoid    Social History:  The patient  reports that she has never smoked. She has never used smokeless tobacco. She reports that she drinks about 1.2 - 1.8 oz of alcohol per week . She reports that she does not use drugs.   Family History:  The patient's family history  includes Parkinsonism in her father.   ROS:  Please see the history of present illness.    Review of Systems: Constitutional:  denies fever, chills, diaphoresis, appetite change and fatigue.  HEENT: denies photophobia, eye pain, redness, hearing loss, ear pain, congestion, sore throat, rhinorrhea, sneezing, neck pain, neck stiffness and tinnitus.  Respiratory: denies SOB, DOE, cough, chest tightness, and wheezing.  Cardiovascular: denies chest pain, palpitations and leg swelling.  Gastrointestinal: denies nausea, vomiting, abdominal pain, diarrhea, constipation, blood in stool.  Genitourinary: denies dysuria, urgency, frequency, hematuria, flank pain and difficulty urinating.  Musculoskeletal: denies  myalgias, back pain, joint swelling, arthralgias and gait problem.   Skin: denies pallor, rash and wound.  Neurological: denies dizziness, seizures, syncope, weakness, light-headedness, numbness and headaches.   Hematological: denies adenopathy, easy bruising, personal or family bleeding history.  Psychiatric/ Behavioral: denies suicidal ideation, mood changes, confusion, nervousness, sleep disturbance and agitation.       All other systems are reviewed and negative.    PHYSICAL EXAM: VS:  BP 100/68 (BP Location: Right Arm, Patient Position: Sitting, Cuff Size: Normal)   Pulse 66   Ht 5\' 5"  (1.651 m)   Wt 141 lb 9.6 oz (64.2 kg)   LMP 10/02/1997   SpO2 98%   BMI 23.56 kg/m  , BMI Body mass index is 23.56 kg/m. GEN: Well nourished, well developed, in no acute distress  HEENT: normal  Neck: no JVD, carotid bruits, or masses Cardiac: RRR; no murmurs, rubs, or gallops,no edema  Respiratory:  clear to auscultation bilaterally, normal work of breathing GI: soft, nontender, nondistended, + BS MS: no deformity or atrophy  Skin: warm and dry, no rash Neuro:  Strength and sensation are intact Psych: normal   EKG:  EKG is not ordered today.     Recent Labs: 01/02/2017: Magnesium  2.2; TSH 1.789 01/04/2017: BUN 13; Creatinine, Ser 0.84; Hemoglobin 13.9; Platelets 164; Potassium 3.7; Sodium 134    Lipid Panel No results found for: CHOL, TRIG, HDL, CHOLHDL, VLDL, LDLCALC, LDLDIRECT    Wt Readings from Last 3 Encounters:  01/25/17 141 lb 9.6 oz (64.2 kg)  01/08/17 142 lb 12.8 oz (64.8 kg)  01/04/17 138 lb (62.6 kg)      Other studies Reviewed: Additional studies/ records that were reviewed today include: . Review of the above records demonstrates:    ASSESSMENT AND PLAN:  1. Paroxysmal atrial fib  - is in  NS[facility] at present  We discussed Afib ablation .   Would wait to see if she fails Flecainide before we consider Ablation   Continue meds .   Will see in 6 months   2. Hyperlipidemia- managed by primary   3. Mitral valve prolapse with mild MR  4.   Current medicines are reviewed at length with the patient today.  The patient does not have concerns regarding medicines.  The following changes have been made:  no change  Labs/ tests ordered today include:   No orders of the defined types were placed in this encounter.    Disposition:   FU with me in 3 months      Mertie Moores, MD  01/25/2017 3:32 PM    [facility] [place] [address], [place], [place]  [address] Phone: [phone]; Fax: [phone]

## 2017-01-25 NOTE — Patient Instructions (Signed)
Medication Instructions:  Your physician recommends that you continue on your current medications as directed. Please refer to the Current Medication list given to you today.   Labwork: None Ordered   Testing/Procedures: None Ordered   Follow-Up: Your physician wants you to follow-up in: 6 months with Dr. Nahser.  You will receive a reminder letter in the mail two months in advance. If you don't receive a letter, please call our office to schedule the follow-up appointment.   If you need a refill on your cardiac medications before your next appointment, please call your pharmacy.   Thank you for choosing [facility]! Brandom Kerwin, RN [phone]    

## 2017-03-05 ENCOUNTER — Other Ambulatory Visit (HOSPITAL_COMMUNITY): Payer: Self-pay | Admitting: Nurse Practitioner

## 2017-03-06 ENCOUNTER — Ambulatory Visit (INDEPENDENT_AMBULATORY_CARE_PROVIDER_SITE_OTHER): Payer: PPO | Admitting: Obstetrics & Gynecology

## 2017-03-06 ENCOUNTER — Encounter: Payer: Self-pay | Admitting: Obstetrics & Gynecology

## 2017-03-06 VITALS — BP 122/76 | HR 74 | Resp 16 | Ht 65.25 in | Wt 142.0 lb

## 2017-03-06 DIAGNOSIS — Z01419 Encounter for gynecological examination (general) (routine) without abnormal findings: Secondary | ICD-10-CM

## 2017-03-06 MED ORDER — ESTRADIOL 2 MG VA RING: 2.0000 mg | VAGINAL_RING | VAGINAL | 4 refills | Status: DC

## 2017-03-06 MED ORDER — VITAMIN D (ERGOCALCIFEROL) 1.25 MG (50000 UNIT) PO CAPS: ORAL_CAPSULE | ORAL | 4 refills | Status: DC

## 2017-03-06 NOTE — Progress Notes (Signed)
76 y.o. [id number] MarriedCaucasianF here for annual exam.  Leaving tomorrow for [employer].    Had two a fib "attacks" 4/3 and 4/5.  Seeing Dr. Acie Fredrickson.    Denies vaginal bleeding.    Dr. Joylene Draft is PCP.  Just had blood work.  She has routine appt in July.  Patient's last menstrual period was 10/02/1997.          Sexually active: Yes.    The current method of family planning is post menopausal status.    Exercising: Yes.    aerobics, pilates, walking  Smoker:  no  Health Maintenance: Pap:  11/19/15 Neg.   09/18/14 Neg  History of abnormal Pap:  yes MMG:  12/13/16 BIRADS1:neg  Colonoscopy:  08/05/12 Normal. F/u 10 years  BMD:   10/13/13 Osteopenia, did one with Dr. Joylene Draft in 2017 TDaP:  With PCP Pneumonia vaccine(s):  Done Zostavax:   01/27/17 Hep C testing: N/A Screening Labs: PCP   reports that she has never smoked. She has never used smokeless tobacco. She reports that she drinks about 1.2 - 1.8 oz of alcohol per week . She reports that she does not use drugs.  Past Medical History:  Diagnosis Date  . Abnormal Pap smear   . Atrial fibrillation [facility])    Very rare episodes  / episode March, 2011  CHADS score O  . Chronic kidney disease    hx of kidney stones  . Drug therapy    Flecainide therapy started March, 2011, treadmill March, 2011, no exercise-induced ectopy  . Ejection fraction    EF 55-65%, echo, October, 2009  . Hyperlipidemia   . Kidney stone   . Mitral regurgitation    Mild, echo, October, 2009  . MVP (mitral valve prolapse)    per pt "mild"    Past Surgical History:  Procedure Laterality Date  . COLONOSCOPY    . DILATATION & CURRETTAGE/HYSTEROSCOPY WITH RESECTOCOPE  08/20/2012   Procedure: [place];  Surgeon: Lyman Speller, MD;  Location: [place] [facility];  Service: Gynecology;  Laterality: N/A;  . GYNECOLOGIC CRYOSURGERY  1980's  . HYSTEROSCOPY  11/3   polyp resection  . TONSILLECTOMY AND ADENOIDECTOMY    .  varicose vein ablation     left    Current Outpatient Prescriptions  Medication Sig Dispense Refill  . acetaminophen (TYLENOL) 500 MG tablet Take 500-1,000 mg by mouth every 6 (six) hours as needed for moderate pain or headache.    Marland Kitchen apixaban (ELIQUIS) 5 MG TABS tablet Take 1 tablet (5 mg total) by mouth 2 (two) times daily. 180 tablet 2  . B Complex Vitamins (VITAMIN B COMPLEX) TABS Take 1 tablet by mouth at bedtime.    Marland Kitchen CARTIA XT 120 MG 24 hr capsule TAKE 1 CAPSULE BY MOUTH  ONCE A DAY 90 capsule 1  . estradiol (ESTRING) 2 MG vaginal ring Place 2 mg vaginally every 3 (three) months. 1 each 4  . fish oil-omega-3 fatty acids 1000 MG capsule Take 1 g by mouth at bedtime.     . flecainide (TAMBOCOR) 100 MG tablet Take 1 tablet (100 mg total) by mouth every 12 (twelve) hours. 60 tablet 3  . Magnesium 500 MG TABS Take 1 tablet by mouth every other day. In PM    . Multiple Vitamin (MULTIVITAMIN) capsule Take 1 capsule by mouth at bedtime.     Marland Kitchen PREVIDENT 5000 BOOSTER PLUS 1.1 % PSTE APPLY PEA SIZE AMOUNT ONTO BRUSH AND APPLY TO ALL  TEETH SURFACE X2 MINS ONCE DAILY AND EXPECTORATE  4  . simvastatin (ZOCOR) 10 MG tablet Take 10 mg by mouth daily.    . Vitamin D, Ergocalciferol, (DRISDOL) 50000 units CAPS capsule TAKE ONE CAPSULE BY MOUTH EVERY 7 DAYS 12 capsule 4  . diltiazem (CARDIZEM) 30 MG tablet Take 1 tablet every 4 hours AS NEEDED for rapid heart rate over 100 as long as blood pressure >100. (Patient not taking: Reported on 03/06/2017) 45 tablet 1   No current facility-administered medications for this visit.     Family History  Problem Relation Age of Onset  . Parkinsonism Father   . Colon cancer Neg Hx   . Esophageal cancer Neg Hx   . Rectal cancer Neg Hx   . Stomach cancer Neg Hx     ROS:  Pertinent items are noted in HPI.  Otherwise, a comprehensive ROS was negative.  Exam:   BP 122/76 (BP Location: Right Arm, Patient Position: Sitting, Cuff Size: Normal)   Pulse 74   Resp 16    Ht 5' 5.25" (1.657 m)   Wt 142 lb (64.4 kg)   LMP 10/02/1997   BMI 23.45 kg/m   Weight change:  -1# Height: 5' 5.25" (165.7 cm)  Ht Readings from Last 3 Encounters:  03/06/17 5' 5.25" (1.657 m)  01/25/17 5\' 5"  (1.651 m)  01/08/17 5\' 5"  (1.651 m)    General appearance: alert, cooperative and appears stated age Head: Normocephalic, without obvious abnormality, atraumatic Neck: no adenopathy, supple, symmetrical, trachea midline and thyroid normal to inspection and palpation Lungs: clear to auscultation bilaterally Breasts: normal appearance, no masses or tenderness Heart: regular rate and rhythm Abdomen: soft, non-tender; bowel sounds normal; no masses,  no organomegaly Extremities: extremities normal, atraumatic, no cyanosis or edema Skin: Skin color, texture, turgor normal. No rashes or lesions Lymph nodes: Cervical, supraclavicular, and axillary nodes normal. No abnormal inguinal nodes palpated Neurologic: Grossly normal   Pelvic: External genitalia:  no lesions              Urethra:  normal appearing urethra with no masses, tenderness or lesions              Bartholins and Skenes: normal                 Vagina: normal appearing vagina with normal color and discharge, no lesions              Cervix: no lesions              Pap taken: No. Bimanual Exam:  Uterus:  normal size, contour, position, consistency, mobility, non-tender              Adnexa: normal adnexa and no mass, fullness, tenderness               Rectovaginal: Confirms               Anus:  normal sphincter tone, no lesions  Chaperone was present for exam.  A:  Well Woman with normal exam PMP, no HRT H/O endometrial polyp removed 2013 H/o Vit D deficiency Vaginal atrophic changes  P:   Mammogram guidelines reviewed pap smear 2017.  No pap smear obtained today. Vit D 50K weekly.  #12/4RF. Rx for Estring 2mg  every 3 months.  #1/4RF return annually or prn

## 2017-03-07 MED ORDER — FLECAINIDE ACETATE 100 MG PO TABS: 100.0000 mg | ORAL_TABLET | Freq: Two times a day (BID) | ORAL | 2 refills | Status: DC

## 2017-03-07 NOTE — Addendum Note (Signed)
Addended by: Derl Barrow on: 03/07/2017 09:36 AM   Modules accepted: Orders

## 2017-04-23 DIAGNOSIS — D1771 Benign lipomatous neoplasm of kidney: Secondary | ICD-10-CM | POA: Diagnosis not present

## 2017-04-23 DIAGNOSIS — R7301 Impaired fasting glucose: Secondary | ICD-10-CM | POA: Diagnosis not present

## 2017-04-23 DIAGNOSIS — M859 Disorder of bone density and structure, unspecified: Secondary | ICD-10-CM | POA: Diagnosis not present

## 2017-04-23 DIAGNOSIS — D3002 Benign neoplasm of left kidney: Secondary | ICD-10-CM | POA: Diagnosis not present

## 2017-04-23 DIAGNOSIS — E784 Other hyperlipidemia: Secondary | ICD-10-CM | POA: Diagnosis not present

## 2017-04-27 DIAGNOSIS — Z1212 Encounter for screening for malignant neoplasm of rectum: Secondary | ICD-10-CM | POA: Diagnosis not present

## 2017-04-30 DIAGNOSIS — D3002 Benign neoplasm of left kidney: Secondary | ICD-10-CM | POA: Diagnosis not present

## 2017-04-30 DIAGNOSIS — N200 Calculus of kidney: Secondary | ICD-10-CM | POA: Diagnosis not present

## 2017-04-30 DIAGNOSIS — R3129 Other microscopic hematuria: Secondary | ICD-10-CM | POA: Diagnosis not present

## 2017-05-09 DIAGNOSIS — R7301 Impaired fasting glucose: Secondary | ICD-10-CM | POA: Diagnosis not present

## 2017-05-09 DIAGNOSIS — I340 Nonrheumatic mitral (valve) insufficiency: Secondary | ICD-10-CM | POA: Diagnosis not present

## 2017-05-09 DIAGNOSIS — I480 Paroxysmal atrial fibrillation: Secondary | ICD-10-CM | POA: Diagnosis not present

## 2017-05-09 DIAGNOSIS — E784 Other hyperlipidemia: Secondary | ICD-10-CM | POA: Diagnosis not present

## 2017-05-09 DIAGNOSIS — E559 Vitamin D deficiency, unspecified: Secondary | ICD-10-CM | POA: Diagnosis not present

## 2017-05-09 DIAGNOSIS — Z0000 Encounter for general adult medical examination without abnormal findings: Secondary | ICD-10-CM | POA: Diagnosis not present

## 2017-05-09 DIAGNOSIS — N200 Calculus of kidney: Secondary | ICD-10-CM | POA: Diagnosis not present

## 2017-05-09 DIAGNOSIS — I341 Nonrheumatic mitral (valve) prolapse: Secondary | ICD-10-CM | POA: Diagnosis not present

## 2017-05-09 DIAGNOSIS — C439 Malignant melanoma of skin, unspecified: Secondary | ICD-10-CM | POA: Diagnosis not present

## 2017-05-09 DIAGNOSIS — Z6823 Body mass index (BMI) 23.0-23.9, adult: Secondary | ICD-10-CM | POA: Diagnosis not present

## 2017-05-09 DIAGNOSIS — R3121 Asymptomatic microscopic hematuria: Secondary | ICD-10-CM | POA: Diagnosis not present

## 2017-05-09 DIAGNOSIS — Z1389 Encounter for screening for other disorder: Secondary | ICD-10-CM | POA: Diagnosis not present

## 2017-06-05 DIAGNOSIS — M859 Disorder of bone density and structure, unspecified: Secondary | ICD-10-CM | POA: Diagnosis not present

## 2017-07-16 DIAGNOSIS — L814 Other melanin hyperpigmentation: Secondary | ICD-10-CM | POA: Diagnosis not present

## 2017-08-03 ENCOUNTER — Encounter: Payer: Self-pay | Admitting: Physician Assistant

## 2017-08-07 DIAGNOSIS — E7849 Other hyperlipidemia: Secondary | ICD-10-CM | POA: Diagnosis not present

## 2017-08-16 ENCOUNTER — Ambulatory Visit: Payer: PPO | Admitting: Physician Assistant

## 2017-08-16 ENCOUNTER — Encounter: Payer: Self-pay | Admitting: Physician Assistant

## 2017-08-16 VITALS — BP 112/64 | HR 83 | Ht 65.5 in | Wt 146.5 lb

## 2017-08-16 DIAGNOSIS — E782 Mixed hyperlipidemia: Secondary | ICD-10-CM

## 2017-08-16 DIAGNOSIS — I480 Paroxysmal atrial fibrillation: Secondary | ICD-10-CM | POA: Diagnosis not present

## 2017-08-16 DIAGNOSIS — I341 Nonrheumatic mitral (valve) prolapse: Secondary | ICD-10-CM

## 2017-08-16 NOTE — Progress Notes (Signed)
Cardiology Office Note    Date:  08/16/2017   ID:  [name], DOB [birth year], MRN [id number]  PCP:  Crist Infante, MD  Cardiologist:  Dr. Acie Fredrickson ([place] patient of Dr. Ron Parker)  Chief Complaint: 6 Months follow up  History of Present Illness:   [name] is a 76 y.o. female with hx of PAF, HLD and MVP with mild MR and CKD presents for follow up.   Seen in ER early 2018 for afib RVR. Converted to sinus on IV diltiazem. Seen afib clinic and by Dr. Acie Fredrickson 12/2016. Increased Flecainide. Plan to consider ablation if fails flecainide.   Here today for follow up. Few months ago her Zocor changed to Crestor 10mg  qd by PCP. LDL has improved on 102 from 129. However since change she has noted muscle soreness on her lower back. She initially blame on sitting on couch for longer period of time during summer times when she goes in [place] for vacation. However symptoms persisted. [place] [facility] [place] [facility] Does aerobic exercise for 1 hours without any issue. No chest pain, shortness of breath, palpitations, orthopnea, PND or syncope.    Past Medical History:  Diagnosis Date  . Abnormal Pap smear   . Atrial fibrillation [facility])    Very rare episodes  / episode March, 2011  CHADS score O  . Chronic kidney disease    hx of kidney stones  . Drug therapy    Flecainide therapy started March, 2011, treadmill March, 2011, no exercise-induced ectopy  . Ejection fraction    EF 55-65%, echo, October, 2009  . Hyperlipidemia   . Kidney stone   . Mitral regurgitation    Mild, echo, October, 2009  . MVP (mitral valve prolapse)    per pt "mild"    Past Surgical History:  Procedure Laterality Date  . COLONOSCOPY    . DILATATION & CURRETTAGE/HYSTEROSCOPY WITH RESECTOCOPE  08/20/2012   Procedure: [place];  Surgeon: Lyman Speller, MD;  Location: [facility];  Service: Gynecology;  Laterality: N/A;  . GYNECOLOGIC CRYOSURGERY  1980's  .  HYSTEROSCOPY  11/3   polyp resection  . TONSILLECTOMY AND ADENOIDECTOMY    . varicose vein ablation     left    Current Medications: Prior to Admission medications   Medication Sig Start Date End Date Taking? Authorizing Provider  acetaminophen (TYLENOL) 500 MG tablet Take 500-1,000 mg by mouth every 6 (six) hours as needed for moderate pain or headache.    [provider]  apixaban (ELIQUIS) 5 MG TABS tablet Take 1 tablet (5 mg total) by mouth 2 (two) times daily. 01/19/17   Sherran Needs, NP  B Complex Vitamins (VITAMIN B COMPLEX) TABS Take 1 tablet by mouth at bedtime.    [provider]  CARTIA XT 120 MG 24 hr capsule TAKE 1 CAPSULE BY MOUTH  ONCE A DAY 09/04/16   Nahser, Wonda Cheng, MD  diltiazem (CARDIZEM) 30 MG tablet Take 1 tablet every 4 hours AS NEEDED for rapid heart rate over 100 as long as blood pressure >100. Patient not taking: Reported on 03/06/2017 01/04/17   Sherran Needs, NP  estradiol (ESTRING) 2 MG vaginal ring Place 2 mg vaginally every 3 (three) months. 03/06/17   Megan Salon, MD  fish oil-omega-3 fatty acids 1000 MG capsule Take 1 g by mouth at bedtime.     [provider]  flecainide (TAMBOCOR) 100 MG tablet Take 1 tablet (100 mg  total) by mouth every 12 (twelve) hours. 03/07/17   Nahser, Wonda Cheng, MD  Magnesium 500 MG TABS Take 1 tablet by mouth every other day. In PM    [provider]  Multiple Vitamin (MULTIVITAMIN) capsule Take 1 capsule by mouth at bedtime.     [provider]  PREVIDENT 5000 BOOSTER PLUS 1.1 % PSTE APPLY PEA SIZE AMOUNT ONTO BRUSH AND APPLY TO ALL TEETH SURFACE X2 MINS ONCE DAILY AND EXPECTORATE 10/25/15   [provider]  simvastatin (ZOCOR) 10 MG tablet Take 10 mg by mouth daily. 05/16/16   [provider]  Vitamin D, Ergocalciferol, (DRISDOL) 50000 units CAPS capsule TAKE ONE CAPSULE BY MOUTH EVERY 7 DAYS 03/06/17   Megan Salon, MD    Allergies:   Tetanus toxoid   Social History     Socioeconomic History  . Marital status: Married    Spouse name: None  . Number of children: None  . Years of education: None  . Highest education level: None  Social Needs  . Financial resource strain: None  . Food insecurity - worry: None  . Food insecurity - inability: None  . Transportation needs - medical: None  . Transportation needs - non-medical: None  Occupational History  . None  Tobacco Use  . Smoking status: Never Smoker  . Smokeless tobacco: Never Used  Substance and Sexual Activity  . Alcohol use: Yes    Alcohol/week: 1.2 - 1.8 oz    Types: 1 - 2 Standard drinks or equivalent, 1 Glasses of wine per week    Comment: socially  . Drug use: No  . Sexual activity: Yes    Partners: Male    Birth control/protection: Post-menopausal  Other Topics Concern  . None  Social History Narrative  . None     Family History:  The patient's family history includes Parkinsonism in her father.   ROS:   Please see the history of present illness.    ROS All other systems reviewed and are negative.   PHYSICAL EXAM:   VS:  BP 112/64   Pulse 83   Ht 5' 5.5" (1.664 m)   Wt 146 lb 8 oz (66.5 kg)   LMP 10/02/1997   SpO2 98%   BMI 24.01 kg/m    GEN: Well nourished, well developed, in no acute distress  HEENT: normal  Neck: no JVD, carotid bruits, or masses Cardiac: RRR; no murmurs, rubs, or gallops,no edema  Respiratory:  clear to auscultation bilaterally, normal work of breathing GI: soft, nontender, nondistended, + BS MS: no deformity or atrophy  Skin: warm and dry, no rash Neuro:  Alert and Oriented x 3, Strength and sensation are intact Psych: euthymic mood, full affect  Wt Readings from Last 3 Encounters:  08/16/17 146 lb 8 oz (66.5 kg)  03/06/17 142 lb (64.4 kg)  01/25/17 141 lb 9.6 oz (64.2 kg)      Studies/Labs Reviewed:   EKG:  EKG is not ordered today.    Recent Labs: 01/02/2017: Magnesium 2.2; TSH 1.789 01/04/2017: BUN 13; Creatinine, Ser 0.84;  Hemoglobin 13.9; Platelets 164; Potassium 3.7; Sodium 134   Lipid Panel No results found for: CHOL, TRIG, HDL, CHOLHDL, VLDL, LDLCALC, LDLDIRECT  Additional studies/ records that were reviewed today include:   Echocardiogram: 08/2015 Study Conclusions  - Left ventricle: The cavity size was normal. Systolic function was   normal. The estimated ejection fraction was in the range of 55%   to 60%. Wall motion was normal;  there were no regional wall   motion abnormalities. - Mitral valve: There was mild regurgitation. - Left atrium: The atrium was mildly dilated.     ASSESSMENT & PLAN:    1. PAF - Sinus rhythm on exam. No palpitations. Continue cardizem, Eliquis and Flecainide.   2. HLD - recently noted sore muscle on back. She may try holding Zocor for at least 2 weeks. If resolved symptoms, she will let us know and try another statin. If no change, follow up with PCP for further evaluation.   3. MVP with mild MR - Asymptomatic    Medication Adjustments/Labs and Tests Ordered: Current medicines are reviewed at length with the patient today.  Concerns regarding medicines are outlined above.  Medication changes, Labs and Tests ordered today are listed in the Patient Instructions below. Patient Instructions  Medication Instructions:  Your physician recommends that you continue on your current medications as directed. Please refer to the Current Medication list given to you today.   Labwork: NONE ORDERED TODAY  Testing/Procedures: NONE ORDERED TODAY  Follow-Up: Your physician wants you to follow-up in: [place] DR. Acie Fredrickson  You will receive a reminder letter in the mail two months in advance. If you don't receive a letter, please call our office to schedule the follow-up appointment.   Any Other Special Instructions Will Be Listed Below (If Applicable).     If you need a refill on your cardiac medications before your next appointment, please call your  pharmacy.      Jarrett Soho, [place]  08/16/2017 1:51 PM    [facility] [place], [place], [place]  [address] Phone: [phone]; Fax: [phone]

## 2017-08-16 NOTE — Patient Instructions (Signed)
Medication Instructions:  Your physician recommends that you continue on your current medications as directed. Please refer to the Current Medication list given to you today.   Labwork: NONE ORDERED TODAY  Testing/Procedures: NONE ORDERED TODAY  Follow-Up: Your physician wants you to follow-up in: 6 MONTHS WITH DR. NAHSER You will receive a reminder letter in the mail two months in advance. If you don't receive a letter, please call our office to schedule the follow-up appointment.   Any Other Special Instructions Will Be Listed Below (If Applicable).     If you need a refill on your cardiac medications before your next appointment, please call your pharmacy.   

## 2017-09-27 ENCOUNTER — Other Ambulatory Visit: Payer: Self-pay | Admitting: Pharmacy Technician

## 2017-09-27 NOTE — Patient Outreach (Signed)
[facility]) Care Management  09/27/2017  TOWANDA HORNSTEIN 1941-07-17 [id number]   Incoming [employer] EMMI call in reference to medication adherence. HIPAA identifiers verified and verbal consent received. Patient states Dr. Joylene Draft switched her from Simvastatin to Rosuvastatin earlier this year. Otherwise there has not been any changes in her medications. She takes them daily as prescribed and does not have any barriers that would affect her adherence.  Doreene Burke, [place] [phone]

## 2017-10-14 ENCOUNTER — Other Ambulatory Visit (HOSPITAL_COMMUNITY): Payer: Self-pay | Admitting: Nurse Practitioner

## 2017-11-12 ENCOUNTER — Other Ambulatory Visit: Payer: Self-pay | Admitting: Internal Medicine

## 2017-11-12 DIAGNOSIS — Z1231 Encounter for screening mammogram for malignant neoplasm of breast: Secondary | ICD-10-CM

## 2017-12-17 DIAGNOSIS — H524 Presbyopia: Secondary | ICD-10-CM | POA: Diagnosis not present

## 2017-12-20 ENCOUNTER — Ambulatory Visit
Admission: RE | Admit: 2017-12-20 | Discharge: 2017-12-20 | Disposition: A | Discharge status: Home / Self Care | Payer: PPO | Source: Ambulatory Visit | Attending: Internal Medicine | Admitting: Internal Medicine

## 2017-12-20 DIAGNOSIS — Z1231 Encounter for screening mammogram for malignant neoplasm of breast: Secondary | ICD-10-CM | POA: Diagnosis not present

## 2018-01-30 ENCOUNTER — Ambulatory Visit: Payer: PPO | Admitting: Cardiovascular Disease

## 2018-03-01 ENCOUNTER — Encounter: Payer: Self-pay | Admitting: Cardiovascular Disease

## 2018-03-01 ENCOUNTER — Ambulatory Visit: Payer: PPO | Admitting: Cardiovascular Disease

## 2018-03-01 VITALS — BP 114/60 | HR 81 | Ht 65.0 in | Wt 146.4 lb

## 2018-03-01 DIAGNOSIS — E782 Mixed hyperlipidemia: Secondary | ICD-10-CM

## 2018-03-01 DIAGNOSIS — I480 Paroxysmal atrial fibrillation: Secondary | ICD-10-CM

## 2018-03-01 MED ORDER — DILTIAZEM HCL ER COATED BEADS 120 MG PO CP24: 120.0000 mg | ORAL_CAPSULE | Freq: Every day | ORAL | 3 refills | Status: DC

## 2018-03-01 MED ORDER — FLECAINIDE ACETATE 100 MG PO TABS: 100.0000 mg | ORAL_TABLET | Freq: Two times a day (BID) | ORAL | 2 refills | Status: DC

## 2018-03-01 MED ORDER — APIXABAN 5 MG PO TABS: 5.0000 mg | ORAL_TABLET | Freq: Two times a day (BID) | ORAL | 11 refills | Status: DC

## 2018-03-01 NOTE — Progress Notes (Signed)
Cardiology Office Note   Date:  03/01/2018   ID:  [name], DOB 1941-04-07, MRN [id number]  PCP:  Crist Infante, MD  Cardiologist:   Mertie Moores, MD   (former Ron Parker patient )   Chief Complaint  Patient presents with  . Atrial Fibrillation   Problem List 1. Paroxysmal atrial fib  2. Hyperlipidemia 3. Mitral valve prolapse with mild MR     [name] is a 77 y.o. female who presents to establish care. She has seen Dr. Ron Parker in the past. Has a hx of paroxysmal atrial fib   She has been stable on Flecainide  Oct. 5, 2017:  Amberle is seen for follow up of her PAF  Spent the summer in [place]  Has been keeping up with her BP.  January 25, 2017  Seen for follow up of her atrial fib. Presented to the ER with rapid atrial fib.   Received IV Dilt and was cardioverted in the ER .  Was seen in the atrial fib clinic - Flecainide was increased to 100 mg BID  Has felt well since then  We discussed Afib ablation   Mar 01, 2018:  [name] is doing well.  She has maintained normal sinus rhythm.  She is tolerating flecainide 100 mg twice a day. She remains very active and exercises regularly. Watches her salt .  Past Medical History:  Diagnosis Date  . Abnormal Pap smear   . Atrial fibrillation [facility])    Very rare episodes  / episode March, 2011  CHADS score O  . Chronic kidney disease    hx of kidney stones  . Drug therapy    Flecainide therapy started March, 2011, treadmill March, 2011, no exercise-induced ectopy  . Ejection fraction    EF 55-65%, echo, October, 2009  . Hyperlipidemia   . Kidney stone   . Mitral regurgitation    Mild, echo, October, 2009  . MVP (mitral valve prolapse)    per pt "mild"    Past Surgical History:  Procedure Laterality Date  . COLONOSCOPY    . DILATATION & CURRETTAGE/HYSTEROSCOPY WITH RESECTOCOPE  08/20/2012   Procedure: [place];  Surgeon: Lyman Speller, MD;  Location:  [facility];  Service: Gynecology;  Laterality: N/A;  . GYNECOLOGIC CRYOSURGERY  1980's  . HYSTEROSCOPY  11/3   polyp resection  . TONSILLECTOMY AND ADENOIDECTOMY    . varicose vein ablation     left     Current Outpatient Medications  Medication Sig Dispense Refill  . acetaminophen (TYLENOL) 500 MG tablet Take 500-1,000 mg by mouth every 6 (six) hours as needed for moderate pain or headache.    Marland Kitchen apixaban (ELIQUIS) 5 MG TABS tablet Take 5 mg by mouth 2 (two) times daily.    . B Complex Vitamins (VITAMIN B COMPLEX) TABS Take 1 tablet by mouth at bedtime.    Marland Kitchen CARTIA XT 120 MG 24 hr capsule TAKE 1 CAPSULE BY MOUTH  ONCE A DAY 90 capsule 1  . diltiazem (CARDIZEM) 30 MG tablet Take 1 tablet every 4 hours AS NEEDED for rapid heart rate over 100 as long as blood pressure >100. 45 tablet 1  . estradiol (ESTRING) 2 MG vaginal ring Place 2 mg vaginally every 3 (three) months. 1 each 4  . fish oil-omega-3 fatty acids 1000 MG capsule Take 1 g by mouth at bedtime.     . flecainide (TAMBOCOR) 100 MG tablet Take 1 tablet (100 mg total)  by mouth every 12 (twelve) hours. 180 tablet 2  . Magnesium 500 MG TABS Take 1 tablet by mouth every other day. In PM    . Multiple Vitamin (MULTIVITAMIN) capsule Take 1 capsule by mouth at bedtime.     Marland Kitchen PREVIDENT 5000 BOOSTER PLUS 1.1 % PSTE APPLY PEA SIZE AMOUNT ONTO BRUSH AND APPLY TO ALL TEETH SURFACE X2 MINS ONCE DAILY AND EXPECTORATE  4  . rosuvastatin (CRESTOR) 10 MG tablet Take 1 tablet daily by mouth.  3  . Vitamin D, Ergocalciferol, (DRISDOL) 50000 units CAPS capsule TAKE ONE CAPSULE BY MOUTH EVERY 7 DAYS 12 capsule 4   No current facility-administered medications for this visit.     Allergies:   Tetanus toxoid    Social History:  The patient  reports that she has never smoked. She has never used smokeless tobacco. She reports that she drinks about 1.2 - 1.8 oz of alcohol per week. She reports that she does not use drugs.   Family History:  The patient's  family history includes Breast cancer (age of onset: 70) in her maternal grandmother; Parkinsonism in her father.   ROS: Noted in current history, otherwise review of systems is negative.  Physical Exam: Blood pressure 114/60, pulse 81, height 5\' 5"  (1.651 m), weight 146 lb 6.4 oz (66.4 kg), last menstrual period 10/02/1997, SpO2 97 %.  GEN:   Pleasant female, NAD  HEENT: Normal NECK: No JVD; No carotid bruits LYMPHATICS: No lymphadenopathy CARDIAC: RR , soft systolic murmur  RESPIRATORY:  Clear to auscultation without rales, wheezing or rhonchi  ABDOMEN: Soft, non-tender, non-distended MUSCULOSKELETAL:  No edema; No deformity  SKIN: Warm and dry NEUROLOGIC:  Alert and oriented x 3   EKG:   Mar 01, 2018: Normal sinus rhythm at 81.  No ST or T wave changes.     Recent Labs: No results found for requested labs within last 8760 hours.    Lipid Panel No results found for: CHOL, TRIG, HDL, CHOLHDL, VLDL, LDLCALC, LDLDIRECT    Wt Readings from Last 3 Encounters:  03/01/18 146 lb 6.4 oz (66.4 kg)  08/16/17 146 lb 8 oz (66.5 kg)  03/06/17 142 lb (64.4 kg)      Other studies Reviewed: Additional studies/ records that were reviewed today include: . Review of the above records demonstrates:    ASSESSMENT AND PLAN:  1. Paroxysmal atrial fib  -has maintained normal sinus rhythm.  Continue flecainide.  EKG looks great.  There is no QRS widening.  2. Hyperlipidemia-   Managed by Dr. Joylene Draft  3. Mitral valve prolapse with mild MR - stable     Current medicines are reviewed at length with the patient today.  The patient does not have concerns regarding medicines.  The following changes have been made:  no change  Labs/ tests ordered today include:   No orders of the defined types were placed in this encounter.    Disposition:   FU with me in 3 months      Mertie Moores, MD  03/01/2018 9:19 AM    [facility] [address], [place], [place]   [address] Phone: [phone]; Fax: [phone]

## 2018-03-01 NOTE — Patient Instructions (Signed)
Medication Instructions:  Your physician has recommended you make the following change in your medication:   STOP Fish Oil STOP Diltiazem (Cardizem) 30 mg  Labwork: None Ordered   Testing/Procedures: None Ordered   Follow-Up: Your physician wants you to follow-up in: 1 year with Dr. Acie Fredrickson. You will receive a reminder letter in the mail two months in advance. If you don't receive a letter, please call our office to schedule the follow-up appointment.   If you need a refill on your cardiac medications before your next appointment, please call your pharmacy.   Thank you for choosing [facility]! Christen Bame, RN [phone]

## 2018-03-04 ENCOUNTER — Ambulatory Visit (INDEPENDENT_AMBULATORY_CARE_PROVIDER_SITE_OTHER): Payer: PPO | Admitting: Obstetrics & Gynecology

## 2018-03-04 ENCOUNTER — Encounter: Payer: Self-pay | Admitting: Obstetrics & Gynecology

## 2018-03-04 ENCOUNTER — Telehealth: Payer: Self-pay | Admitting: *Deleted

## 2018-03-04 ENCOUNTER — Other Ambulatory Visit: Payer: Self-pay

## 2018-03-04 VITALS — BP 116/62 | HR 80 | Resp 16 | Ht 65.0 in | Wt 145.4 lb

## 2018-03-04 DIAGNOSIS — R300 Dysuria: Secondary | ICD-10-CM | POA: Diagnosis not present

## 2018-03-04 LAB — POCT URINALYSIS DIPSTICK
Bilirubin, UA: NEGATIVE
GLUCOSE UA: NEGATIVE
Ketones, UA: NEGATIVE
Nitrite, UA: NEGATIVE
Protein, UA: NEGATIVE
Urobilinogen, UA: 0.2 E.U./dL
pH, UA: 5 (ref 5.0–8.0)

## 2018-03-04 MED ORDER — SULFAMETHOXAZOLE-TRIMETHOPRIM 800-160 MG PO TABS: 1.0000 | ORAL_TABLET | Freq: Two times a day (BID) | ORAL | 0 refills | Status: DC

## 2018-03-04 NOTE — Progress Notes (Signed)
GYNECOLOGY  VISIT  CC:   Possible UTI  HPI: 77 y.o. G64P2002 Married Caucasian [name] here for UTI symptoms that have been going on for a few days.  She is having some urinary urgency and dysuria.  Denies vaginal bleeding or hematuria.  Denies back pain or pelvic pain.  No fevers.  Denies vaginal discharge.    GYNECOLOGIC HISTORY: Patient's last menstrual period was 10/02/1997. Contraception: post menopausal  Menopausal hormone therapy: none  Patient Active Problem List   Diagnosis Date Noted  . Abdominal pain in pregnancy, antepartum 02/17/2015  . Acute left lower quadrant pain 02/17/2015  . CN (constipation) 02/17/2015  . Varicose veins 08/03/2014  . Spider veins of both lower extremities 08/03/2014  . Varicose veins of lower extremities with other complications 02/40/9735  . Hyperlipidemia   . Drug therapy   . Atrial fibrillation ([place])   . Ejection fraction   . Mitral regurgitation     Past Medical History:  Diagnosis Date  . Abnormal Pap smear   . Atrial fibrillation [facility])    Very rare episodes  / episode March, 2011  CHADS score O  . Chronic kidney disease    hx of kidney stones  . Drug therapy    Flecainide therapy started March, 2011, treadmill March, 2011, no exercise-induced ectopy  . Ejection fraction    EF 55-65%, echo, October, 2009  . Hyperlipidemia   . Kidney stone   . Mitral regurgitation    Mild, echo, October, 2009  . MVP (mitral valve prolapse)    per pt "mild"    Past Surgical History:  Procedure Laterality Date  . COLONOSCOPY    . DILATATION & CURRETTAGE/HYSTEROSCOPY WITH RESECTOCOPE  08/20/2012   Procedure: [place];  Surgeon: Lyman Speller, MD;  Location: [facility];  Service: Gynecology;  Laterality: N/A;  . GYNECOLOGIC CRYOSURGERY  1980's  . HYSTEROSCOPY  11/3   polyp resection  . TONSILLECTOMY AND ADENOIDECTOMY    . varicose vein ablation     left    MEDS:   Current Outpatient Medications  on File Prior to Visit  Medication Sig Dispense Refill  . acetaminophen (TYLENOL) 500 MG tablet Take 500-1,000 mg by mouth every 6 (six) hours as needed for moderate pain or headache.    Marland Kitchen apixaban (ELIQUIS) 5 MG TABS tablet Take 1 tablet (5 mg total) by mouth 2 (two) times daily. 60 tablet 11  . B Complex Vitamins (VITAMIN B COMPLEX) TABS Take 1 tablet by mouth at bedtime.    Marland Kitchen diltiazem (CARTIA XT) 120 MG 24 hr capsule Take 1 capsule (120 mg total) by mouth daily. 90 capsule 3  . estradiol (ESTRING) 2 MG vaginal ring Place 2 mg vaginally every 3 (three) months. 1 each 4  . flecainide (TAMBOCOR) 100 MG tablet Take 1 tablet (100 mg total) by mouth every 12 (twelve) hours. 180 tablet 2  . Magnesium 500 MG TABS Take 1 tablet by mouth every other day. In PM    . Multiple Vitamin (MULTIVITAMIN) capsule Take 1 capsule by mouth at bedtime.     Marland Kitchen PREVIDENT 5000 BOOSTER PLUS 1.1 % PSTE APPLY PEA SIZE AMOUNT ONTO BRUSH AND APPLY TO ALL TEETH SURFACE X2 MINS ONCE DAILY AND EXPECTORATE  4  . rosuvastatin (CRESTOR) 10 MG tablet Take 1 tablet daily by mouth.  3  . Vitamin D, Ergocalciferol, (DRISDOL) 50000 units CAPS capsule TAKE ONE CAPSULE BY MOUTH EVERY 7 DAYS 12 capsule 4   No current  facility-administered medications on file prior to visit.     ALLERGIES: Tetanus toxoid  Family History  Problem Relation Age of Onset  . Parkinsonism Father   . Breast cancer Maternal Grandmother 22  . Colon cancer Neg Hx   . Esophageal cancer Neg Hx   . Rectal cancer Neg Hx   . Stomach cancer Neg Hx     SH:  Married, non smoker  Review of Systems  Genitourinary: Positive for dysuria and urgency.       Vulvar burning   All other systems reviewed and are negative.   PHYSICAL EXAMINATION:    BP 116/62 (BP Location: Right Arm, Patient Position: Sitting, Cuff Size: Large)   Pulse 80   Resp 16   Ht 5\' 5"  (1.651 m)   Wt 145 lb 6.4 oz (66 kg)   LMP 10/02/1997   BMI 24.20 kg/m     General appearance:  alert, cooperative and appears stated age CV:  Regular rate and rhythm Lungs:  clear to auscultation, no wheezes, rales or rhonchi, symmetric air entry Abdomen: mild + suprapubic tendernes  Pelvic: External genitalia:  no lesions              Urethra:  normal appearing urethra with no masses, tenderness or lesions              Bartholins and Skenes: normal                 Vagina: normal appearing vagina with normal color and discharge, no lesions              Cervix: no lesions              Bimanual Exam:  Uterus:  normal size, contour, position, consistency, mobility, non-tender              Adnexa: no mass, fullness, tenderness  Chaperone was present for exam.  Assessment: Urinary dysuria  Plan: Bactrim DX BID x 3 days Urinary micro and culture pending Advised to call if symptoms are not significantly improving over the first 24-48 hours

## 2018-03-04 NOTE — Telephone Encounter (Signed)
Patient is experiencing some burning, frequency and labial itching. Ok to leave a detailed message.

## 2018-03-04 NOTE — Telephone Encounter (Signed)
Spoke with patient. Patient states that two days ago she began having burning with urination, urinary frequency, and labial itching. Symptoms are worsening. Denies fever or chills. Would like to be seen with Dr.Miller for evaluation. Appointment scheduled for 6/3 at 3:45 pm with Dr.Miller. Patient is agreeable to date and time.  Routing to provider for final review. Patient agreeable to disposition. Will close encounter.

## 2018-03-05 LAB — URINALYSIS, MICROSCOPIC ONLY: Casts: NONE SEEN /lpf

## 2018-03-06 ENCOUNTER — Encounter

## 2018-03-06 LAB — URINE CULTURE

## 2018-03-08 MED ORDER — FLUCONAZOLE 150 MG PO TABS: 150.0000 mg | ORAL_TABLET | Freq: Once | ORAL | 0 refills | Status: DC

## 2018-03-08 NOTE — Addendum Note (Signed)
Addended by: Megan Salon on: 03/08/2018 06:26 PM   Modules accepted: Orders

## 2018-05-11 ENCOUNTER — Other Ambulatory Visit: Payer: Self-pay | Admitting: Obstetrics & Gynecology

## 2018-05-13 NOTE — Telephone Encounter (Signed)
Medication refill request: Vitamin D 50,000 units Last AEX:  03/06/17 Next AEX: 06/07/18 Last MMG (if hormonal medication request): 12/20/17 Bi-rads Category 1 neg   Refill authorized: Please advise

## 2018-06-07 ENCOUNTER — Other Ambulatory Visit (HOSPITAL_COMMUNITY)
Admission: RE | Admit: 2018-06-07 | Discharge: 2018-06-07 | Disposition: A | Discharge status: Home / Self Care | Payer: PPO | Source: Ambulatory Visit | Attending: Obstetrics & Gynecology | Admitting: Obstetrics & Gynecology

## 2018-06-07 ENCOUNTER — Telehealth: Payer: Self-pay | Admitting: Obstetrics & Gynecology

## 2018-06-07 ENCOUNTER — Ambulatory Visit (INDEPENDENT_AMBULATORY_CARE_PROVIDER_SITE_OTHER): Payer: PPO | Admitting: Obstetrics & Gynecology

## 2018-06-07 ENCOUNTER — Encounter: Payer: Self-pay | Admitting: Obstetrics & Gynecology

## 2018-06-07 VITALS — BP 110/70 | HR 72 | Resp 16 | Ht 64.25 in | Wt 144.6 lb

## 2018-06-07 DIAGNOSIS — D251 Intramural leiomyoma of uterus: Secondary | ICD-10-CM | POA: Diagnosis not present

## 2018-06-07 DIAGNOSIS — Z01419 Encounter for gynecological examination (general) (routine) without abnormal findings: Secondary | ICD-10-CM | POA: Diagnosis not present

## 2018-06-07 DIAGNOSIS — M8580 Other specified disorders of bone density and structure, unspecified site: Secondary | ICD-10-CM | POA: Diagnosis not present

## 2018-06-07 DIAGNOSIS — Z124 Encounter for screening for malignant neoplasm of cervix: Secondary | ICD-10-CM

## 2018-06-07 MED ORDER — ESTRADIOL 2 MG VA RING: 2.0000 mg | VAGINAL_RING | VAGINAL | 4 refills | Status: DC

## 2018-06-07 MED ORDER — FLUCONAZOLE 150 MG PO TABS: 150.0000 mg | ORAL_TABLET | Freq: Once | ORAL | 0 refills | Status: AC

## 2018-06-07 NOTE — Telephone Encounter (Signed)
Call placed to convey benefits. 

## 2018-06-07 NOTE — Progress Notes (Signed)
Patient scheduled while in office for PUS on 06/20/18 at 1pm, consult to follow at 1:30pm with Dr. Sabra Heck. Order placed by Dr. Sabra Heck. Patient verbalizes understanding and is agreeable.

## 2018-06-07 NOTE — Progress Notes (Signed)
77 y.o. [id number] MarriedCaucasianF here for annual exam.  Has two very good friends just diagnosed with ovarian cancer.  Last ultrasound was in 2016.  Desires to have an ultrasound.  Having a little labial burning.    Patient's last menstrual period was 10/02/1997.          Sexually active: Yes.    The current method of family planning is post menopausal status.    Exercising: Yes.    group aerobics Smoker:  no  Health Maintenance: Pap:  11/19/15 neg   09/18/14 Neg  History of abnormal Pap:  Yes, h/o cryo MMG:  12/20/17 BIRADS1:Neg  Colonoscopy:  08/05/12 Normal. F/u 10 years  BMD:   10/13/13 osteopenia  TDaP:  PCP Pneumonia vaccine(s):  PCP Shingrix:   completed Hep C testing: n/a Screening Labs: PCP   reports that she has never smoked. She has never used smokeless tobacco. She reports that she drinks about 2.0 - 3.0 standard drinks of alcohol per week. She reports that she does not use drugs.  Past Medical History:  Diagnosis Date  . Abnormal Pap smear   . Atrial fibrillation [facility])    Very rare episodes  / episode March, 2011  CHADS score O  . Chronic kidney disease    hx of kidney stones  . Drug therapy    Flecainide therapy started March, 2011, treadmill March, 2011, no exercise-induced ectopy  . Ejection fraction    EF 55-65%, echo, October, 2009  . Hyperlipidemia   . Kidney stone   . Mitral regurgitation    Mild, echo, October, 2009  . MVP (mitral valve prolapse)    per pt "mild"    Past Surgical History:  Procedure Laterality Date  . COLONOSCOPY    . DILATATION & CURRETTAGE/HYSTEROSCOPY WITH RESECTOCOPE  08/20/2012   Procedure: [place];  Surgeon: Lyman Speller, MD;  Location: [facility];  Service: Gynecology;  Laterality: N/A;  . GYNECOLOGIC CRYOSURGERY  1980's  . HYSTEROSCOPY  11/3   polyp resection  . TONSILLECTOMY AND ADENOIDECTOMY    . varicose vein ablation     left    Current Outpatient Medications   Medication Sig Dispense Refill  . acetaminophen (TYLENOL) 500 MG tablet Take 500-1,000 mg by mouth every 6 (six) hours as needed for moderate pain or headache.    Marland Kitchen apixaban (ELIQUIS) 5 MG TABS tablet Take 1 tablet (5 mg total) by mouth 2 (two) times daily. 60 tablet 11  . B Complex Vitamins (VITAMIN B COMPLEX) TABS Take 1 tablet by mouth at bedtime.    Marland Kitchen diltiazem (CARTIA XT) 120 MG 24 hr capsule Take 1 capsule (120 mg total) by mouth daily. 90 capsule 3  . estradiol (ESTRING) 2 MG vaginal ring Place 2 mg vaginally every 3 (three) months. 1 each 4  . flecainide (TAMBOCOR) 100 MG tablet Take 1 tablet (100 mg total) by mouth every 12 (twelve) hours. 180 tablet 2  . Magnesium 500 MG TABS Take 1 tablet by mouth every other day. In PM    . Multiple Vitamin (MULTIVITAMIN) capsule Take 1 capsule by mouth at bedtime.     Marland Kitchen PREVIDENT 5000 BOOSTER PLUS 1.1 % PSTE APPLY PEA SIZE AMOUNT ONTO BRUSH AND APPLY TO ALL TEETH SURFACE X2 MINS ONCE DAILY AND EXPECTORATE  4  . rosuvastatin (CRESTOR) 10 MG tablet Take 1 tablet daily by mouth.  3  . Vitamin D, Ergocalciferol, (DRISDOL) 50000 units CAPS capsule TAKE ONE CAPSULE BY MOUTH EVERY  7 DAYS 12 capsule 4   No current facility-administered medications for this visit.     Family History  Problem Relation Age of Onset  . Parkinsonism Father   . Breast cancer Maternal Grandmother 53  . Breast cancer Sister   . Colon cancer Neg Hx   . Esophageal cancer Neg Hx   . Rectal cancer Neg Hx   . Stomach cancer Neg Hx     Review of Systems  Genitourinary:       Loss of urine spontaneously  Loss of urine with sneeze or cough   All other systems reviewed and are negative.   Exam:   BP 110/70 (BP Location: Right Arm, Patient Position: Sitting, Cuff Size: Normal)   Pulse 72   Resp 16   Ht 5' 4.25" (1.632 m)   Wt 144 lb 9.6 oz (65.6 kg)   LMP 10/02/1997   BMI 24.63 kg/m    Height: 5' 4.25" (163.2 cm)  Ht Readings from Last 3 Encounters:  06/07/18 5'  4.25" (1.632 m)  03/04/18 5\' 5"  (1.651 m)  03/01/18 5\' 5"  (1.651 m)    General appearance: alert, cooperative and appears stated age Head: Normocephalic, without obvious abnormality, atraumatic Neck: no adenopathy, supple, symmetrical, trachea midline and thyroid normal to inspection and palpation Lungs: clear to auscultation bilaterally Breasts: normal appearance, no masses or tenderness Heart: regular rate and rhythm Abdomen: soft, non-tender; bowel sounds normal; no masses,  no organomegaly Extremities: extremities normal, atraumatic, no cyanosis or edema Skin: Skin color, texture, turgor normal. No rashes or lesions Lymph nodes: Cervical, supraclavicular, and axillary nodes normal. No abnormal inguinal nodes palpated Neurologic: Grossly normal   Pelvic: External genitalia:  Small fissure between labia major and minora on the left              Urethra:  normal appearing urethra with no masses, tenderness or lesions              Bartholins and Skenes: normal                 Vagina: normal appearing vagina with normal color and discharge, no lesions              Cervix: no lesions              Pap taken: Yes.   Bimanual Exam:  Uterus:  normal size, contour, position, consistency, mobility, non-tender              Adnexa: normal adnexa and no mass, fullness, tenderness               Rectovaginal: Confirms               Anus:  normal sphincter tone, no lesions  Chaperone was present for exam.  A:  Well Woman with normal exam PMP, no HRT H/o endometrial polyp removed 2013 H/O low Vit D Concerns about ovarian cancer.  H/o uterine fibroids.  P:   Mammogram guidelines reviewed.  Doing 3D yearly. pap smear obtained today.   On Vit D 50K weekly.  Does not need rx. RF for Estring vaginal ring 2mg  pv every 3 months Diflucan 150mg  po x 1, repeat 72 hours Screening lab and vaccines with Dr. Selena Lesser  Return for PUS Return annually or prn

## 2018-06-07 NOTE — Patient Instructions (Signed)
Aquaphor  Huggies Natural wipes

## 2018-06-08 DIAGNOSIS — Z23 Encounter for immunization: Secondary | ICD-10-CM | POA: Diagnosis not present

## 2018-06-10 LAB — CYTOLOGY - PAP: Diagnosis: NEGATIVE

## 2018-06-10 NOTE — Telephone Encounter (Signed)
Spoke with patient in regards to benefits for scheduled ultrasound.  Patient understood and agreeable. Patient scheduled 06/20/18 with Dr Sabra Heck. Patient aware of appointment date, arrival time and cancellation policy. No further questions. Ok to close

## 2018-06-10 NOTE — Telephone Encounter (Signed)
Patient returned call to [facility] who is out of the office today.   Cc: Deloris Ping

## 2018-06-12 DIAGNOSIS — R82998 Other abnormal findings in urine: Secondary | ICD-10-CM | POA: Diagnosis not present

## 2018-06-12 DIAGNOSIS — R7301 Impaired fasting glucose: Secondary | ICD-10-CM | POA: Diagnosis not present

## 2018-06-12 DIAGNOSIS — E7849 Other hyperlipidemia: Secondary | ICD-10-CM | POA: Diagnosis not present

## 2018-06-12 DIAGNOSIS — E559 Vitamin D deficiency, unspecified: Secondary | ICD-10-CM | POA: Diagnosis not present

## 2018-06-13 DIAGNOSIS — N200 Calculus of kidney: Secondary | ICD-10-CM | POA: Diagnosis not present

## 2018-06-13 DIAGNOSIS — D3002 Benign neoplasm of left kidney: Secondary | ICD-10-CM | POA: Diagnosis not present

## 2018-06-19 DIAGNOSIS — Z1212 Encounter for screening for malignant neoplasm of rectum: Secondary | ICD-10-CM | POA: Diagnosis not present

## 2018-06-20 ENCOUNTER — Ambulatory Visit (INDEPENDENT_AMBULATORY_CARE_PROVIDER_SITE_OTHER): Payer: PPO

## 2018-06-20 ENCOUNTER — Encounter: Payer: Self-pay | Admitting: Obstetrics & Gynecology

## 2018-06-20 ENCOUNTER — Ambulatory Visit (INDEPENDENT_AMBULATORY_CARE_PROVIDER_SITE_OTHER): Payer: PPO | Admitting: Obstetrics & Gynecology

## 2018-06-20 VITALS — BP 106/62 | HR 84 | Resp 16 | Ht 64.25 in | Wt 146.0 lb

## 2018-06-20 DIAGNOSIS — D251 Intramural leiomyoma of uterus: Secondary | ICD-10-CM | POA: Diagnosis not present

## 2018-06-20 DIAGNOSIS — I340 Nonrheumatic mitral (valve) insufficiency: Secondary | ICD-10-CM | POA: Diagnosis not present

## 2018-06-20 DIAGNOSIS — C439 Malignant melanoma of skin, unspecified: Secondary | ICD-10-CM | POA: Diagnosis not present

## 2018-06-20 DIAGNOSIS — R7301 Impaired fasting glucose: Secondary | ICD-10-CM | POA: Diagnosis not present

## 2018-06-20 DIAGNOSIS — E7849 Other hyperlipidemia: Secondary | ICD-10-CM | POA: Diagnosis not present

## 2018-06-20 DIAGNOSIS — Z1389 Encounter for screening for other disorder: Secondary | ICD-10-CM | POA: Diagnosis not present

## 2018-06-20 DIAGNOSIS — I480 Paroxysmal atrial fibrillation: Secondary | ICD-10-CM | POA: Diagnosis not present

## 2018-06-20 DIAGNOSIS — Z6823 Body mass index (BMI) 23.0-23.9, adult: Secondary | ICD-10-CM | POA: Diagnosis not present

## 2018-06-20 DIAGNOSIS — M859 Disorder of bone density and structure, unspecified: Secondary | ICD-10-CM | POA: Diagnosis not present

## 2018-06-20 DIAGNOSIS — R3121 Asymptomatic microscopic hematuria: Secondary | ICD-10-CM | POA: Diagnosis not present

## 2018-06-20 DIAGNOSIS — I341 Nonrheumatic mitral (valve) prolapse: Secondary | ICD-10-CM | POA: Diagnosis not present

## 2018-06-20 DIAGNOSIS — N200 Calculus of kidney: Secondary | ICD-10-CM | POA: Diagnosis not present

## 2018-06-20 DIAGNOSIS — Z0000 Encounter for general adult medical examination without abnormal findings: Secondary | ICD-10-CM | POA: Diagnosis not present

## 2018-06-20 NOTE — Progress Notes (Signed)
77 y.o. G46P2002 [name] here for pelvic ultrasound due to recheck uterine fibroids and ovaries.  She's had some recent concern regarding ovarian cancer.  Denies vaginal bleeding.    Patient's last menstrual period was 10/02/1997.  Contraception: PMP  Findings:  UTERUS: 4/2 x 3.4 x 2.5cm with two fibroids with two intramural fibroids measuring 0.7cm each EMS: 2.14mm ADNEXA: Left ovary:  1.3 x 0.9 x 0.7cm       Right ovary: 1.1 x 0.8 x 0.7cm CUL DE SAC: no free fluid  Discussion:  Findings reviewed.  Questions answered.  Pt reassured.  Assessment:  Uterine fibroids Normal PMP ovaries  Plan:  Pt will return for AEX 1 year.

## 2018-08-27 DIAGNOSIS — L720 Epidermal cyst: Secondary | ICD-10-CM | POA: Diagnosis not present

## 2018-10-21 DIAGNOSIS — D3002 Benign neoplasm of left kidney: Secondary | ICD-10-CM | POA: Diagnosis not present

## 2018-10-21 DIAGNOSIS — R310 Gross hematuria: Secondary | ICD-10-CM | POA: Diagnosis not present

## 2018-10-21 DIAGNOSIS — N200 Calculus of kidney: Secondary | ICD-10-CM | POA: Diagnosis not present

## 2018-10-22 DIAGNOSIS — N132 Hydronephrosis with renal and ureteral calculous obstruction: Secondary | ICD-10-CM | POA: Diagnosis not present

## 2018-10-22 DIAGNOSIS — R310 Gross hematuria: Secondary | ICD-10-CM | POA: Diagnosis not present

## 2018-10-28 DIAGNOSIS — R310 Gross hematuria: Secondary | ICD-10-CM | POA: Diagnosis not present

## 2018-10-28 DIAGNOSIS — D3002 Benign neoplasm of left kidney: Secondary | ICD-10-CM | POA: Diagnosis not present

## 2018-10-28 DIAGNOSIS — N200 Calculus of kidney: Secondary | ICD-10-CM | POA: Diagnosis not present

## 2018-11-03 ENCOUNTER — Other Ambulatory Visit (HOSPITAL_COMMUNITY): Payer: Self-pay | Admitting: Nurse Practitioner

## 2018-11-04 NOTE — Telephone Encounter (Signed)
Pt last saw Dr Acie Fredrickson 03/01/18, last labs 06/12/18 Creat 0.8 at [facility], based on specified criteria pt is on appropriate dosage of Eliquis 5mg  BID.  Will refill rx.

## 2018-11-20 ENCOUNTER — Other Ambulatory Visit: Payer: Self-pay | Admitting: Cardiovascular Disease

## 2018-12-05 ENCOUNTER — Other Ambulatory Visit: Payer: Self-pay | Admitting: Internal Medicine

## 2018-12-05 DIAGNOSIS — Z1231 Encounter for screening mammogram for malignant neoplasm of breast: Secondary | ICD-10-CM

## 2019-01-02 ENCOUNTER — Ambulatory Visit: Payer: PPO

## 2019-02-12 ENCOUNTER — Other Ambulatory Visit: Payer: Self-pay | Admitting: Cardiovascular Disease

## 2019-02-19 ENCOUNTER — Ambulatory Visit: Payer: PPO

## 2019-02-19 ENCOUNTER — Ambulatory Visit
Admission: RE | Admit: 2019-02-19 | Discharge: 2019-02-19 | Disposition: A | Discharge status: Home / Self Care | Payer: PPO | Source: Ambulatory Visit | Attending: Internal Medicine | Admitting: Internal Medicine

## 2019-02-19 ENCOUNTER — Other Ambulatory Visit: Payer: Self-pay

## 2019-02-19 DIAGNOSIS — Z1231 Encounter for screening mammogram for malignant neoplasm of breast: Secondary | ICD-10-CM

## 2019-02-27 ENCOUNTER — Telehealth: Payer: Self-pay | Admitting: Nurse Practitioner

## 2019-02-27 NOTE — Telephone Encounter (Signed)
Left detailed message for patient to call back to let us know if she would like to be seen virtually on 6/8. I advised that if she has concerns that may warrant an office visit, to leave a message for me to call her back.

## 2019-02-27 NOTE — Telephone Encounter (Signed)
Left message for patient to call back regarding appointment on June 8 with Dr. Acie Fredrickson

## 2019-02-27 NOTE — Telephone Encounter (Signed)
Follow up  ° ° °Patient is returning call.  °

## 2019-03-09 ENCOUNTER — Encounter: Payer: Self-pay | Admitting: Cardiovascular Disease

## 2019-03-09 NOTE — Progress Notes (Signed)
Virtual Visit via Video Note   This visit type was conducted due to national recommendations for restrictions regarding the COVID-19 Pandemic (e.g. social distancing) in an effort to limit this patient's exposure and mitigate transmission in our community.  Due to her co-morbid illnesses, this patient is at least at moderate risk for complications without adequate follow up.  This format is felt to be most appropriate for this patient at this time.  All issues noted in this document were discussed and addressed.  A limited physical exam was performed with this format.  Please refer to the patient's chart for her consent to telehealth for [facility].   Date:  03/10/2019   ID:  Jenna, [name] 08-Jun-1941, MRN [id number]  Patient Location: Home Provider Location: Home  PCP:  Crist Infante, MD  Cardiologist:  Mertie Moores, MD  Electrophysiologist:  None   Evaluation Performed:  Follow-Up Visit  Chief Complaint:  Atrial fib   Problem List 1. Paroxysmal atrial fib  2. Hyperlipidemia 3. Mitral valve prolapse with mild MR     [name] is a 77 y.o. female who presents to establish care. She has seen Dr. Ron Parker in the past. Has a hx of paroxysmal atrial fib   She has been stable on Flecainide  Oct. 5, 2017:  [name] is seen for follow up of her PAF  Spent the summer in [place]  Has been keeping up with her BP.  January 25, 2017  Seen for follow up of her atrial fib. Presented to the ER with rapid atrial fib.   Received IV Dilt and was cardioverted in the ER .  Was seen in the atrial fib clinic - Flecainide was increased to 100 mg BID  Has felt well since then  We discussed Afib ablation   Mar 01, 2018:  [name] is doing well.  She has maintained normal sinus rhythm.  She is tolerating flecainide 100 mg twice a day. She remains very active and exercises regularly. Watches her salt .   March 10, 2019    [name] is a 78 y.o. female  with PAF.  Has been on Flecainide .  She has a history of hyperlipidemia and  mitral valve prolapse with mild mitral regurgitation. Playing bridge online  Walks around the neighborhood.  Has some limitations - bunion on her foot .  No CP or dyspnea  No palpitations Had a kidney stone over the past year.     Wears mask,  Social distancing  The patient does not have symptoms concerning for COVID-19 infection (fever, chills, cough, or new shortness of breath).    Past Medical History:  Diagnosis Date  . Abnormal Pap smear   . Atrial fibrillation ([place])   . Chronic kidney disease    hx of kidney stones  . Drug therapy    Flecainide therapy started March, 2011, treadmill March, 2011, no exercise-induced ectopy  . Ejection fraction    EF 55-65%, echo, October, 2009  . Hyperlipidemia   . Kidney stone   . Mitral regurgitation    Mild, echo, October, 2009  . MVP (mitral valve prolapse)    per pt "mild"   Past Surgical History:  Procedure Laterality Date  . COLONOSCOPY    . DILATATION & CURRETTAGE/HYSTEROSCOPY WITH RESECTOCOPE  08/20/2012   Procedure: [place];  Surgeon: Lyman Speller, MD;  Location: [facility];  Service: Gynecology;  Laterality: N/A;  . GYNECOLOGIC CRYOSURGERY  1980's  .  HYSTEROSCOPY  11/3   polyp resection  . TONSILLECTOMY AND ADENOIDECTOMY    . varicose vein ablation     left     Current Meds  Medication Sig  . acetaminophen (TYLENOL) 500 MG tablet Take 500-1,000 mg by mouth every 6 (six) hours as needed for moderate pain or headache.  . B Complex Vitamins (VITAMIN B COMPLEX) TABS Take 1 tablet by mouth at bedtime.  Marland Kitchen diltiazem (CARTIA XT) 120 MG 24 hr capsule Take 1 capsule (120 mg total) by mouth daily.  Marland Kitchen ELIQUIS 5 MG TABS tablet TAKE 1 TABLET BY MOUTH TWICE A DAY  . estradiol (ESTRING) 2 MG vaginal ring Place 2 mg vaginally every 3 (three) months.  . flecainide (TAMBOCOR) 100 MG tablet TAKE 1 TABLET BY MOUTH  EVERY 12 HOURS. PLEASE KEEP UPCOMING APPT IN [place]  . Magnesium 500 MG TABS Take 1 tablet by mouth every other day. In PM  . Multiple Vitamin (MULTIVITAMIN) capsule Take 1 capsule by mouth at bedtime.   Marland Kitchen PREVIDENT 5000 BOOSTER PLUS 1.1 % PSTE APPLY PEA SIZE AMOUNT ONTO BRUSH AND APPLY TO ALL TEETH SURFACE X2 MINS ONCE DAILY AND EXPECTORATE  . rosuvastatin (CRESTOR) 10 MG tablet Take 1 tablet daily by mouth.  . Vitamin D, Ergocalciferol, (DRISDOL) 50000 units CAPS capsule TAKE ONE CAPSULE BY MOUTH EVERY 7 DAYS     Allergies:   Tetanus toxoid   Social History   Tobacco Use  . Smoking status: Never Smoker  . Smokeless tobacco: Never Used  Substance Use Topics  . Alcohol use: Yes    Alcohol/week: 2.0 - 3.0 standard drinks    Types: 1 Glasses of wine, 1 - 2 Standard drinks or equivalent per week    Comment: socially  . Drug use: No     Family Hx: The patient's family history includes Breast cancer in her sister; Breast cancer (age of onset: 54) in her maternal grandmother; Parkinsonism in her father. There is no history of Colon cancer, Esophageal cancer, Rectal cancer, or Stomach cancer.  ROS:   Please see the history of present illness.     All other systems reviewed and are negative.   Prior CV studies:   The following studies were reviewed today:    Labs/Other Tests and Data Reviewed:    EKG:  No ECG reviewed.  Recent Labs: No results found for requested labs within last 8760 hours.   Recent Lipid Panel No results found for: CHOL, TRIG, HDL, CHOLHDL, LDLCALC, LDLDIRECT  Wt Readings from Last 3 Encounters:  03/10/19 142 lb (64.4 kg)  06/20/18 146 lb (66.2 kg)  06/07/18 144 lb 9.6 oz (65.6 kg)     Objective:    Vital Signs:  BP 140/79   Pulse 71   Ht 5' 4.25" (1.632 m)   Wt 142 lb (64.4 kg)   LMP 10/02/1997   BMI 24.18 kg/m    VITAL SIGNS:  reviewed GEN:  no acute distress EYES:  sclerae anicteric, EOMI - Extraocular Movements Intact RESPIRATORY:  normal  respiratory effort, symmetric expansion CARDIOVASCULAR:  no peripheral edema SKIN:  no rash, lesions or ulcers. MUSCULOSKELETAL:  no obvious deformities. NEURO:  alert and oriented x 3, no obvious focal deficit PSYCH:  normal affect  ASSESSMENT & PLAN:    1. PAF : no recent arrhythmias.     2.  MVP :  Stable.  No dyspnea  3.   Hyperlipidemia:  Stable, monitored by Dr. Joylene Draft  COVID-19 Education: The signs and  symptoms of COVID-19 were discussed with the patient and how to seek care for testing (follow up with PCP or arrange E-visit).  The importance of social distancing was discussed today.  Time:   Today, I have spent  18 minutes with the patient with telehealth technology discussing the above problems.     Medication Adjustments/Labs and Tests Ordered: Current medicines are reviewed at length with the patient today.  Concerns regarding medicines are outlined above.   Tests Ordered: No orders of the defined types were placed in this encounter.   Medication Changes: No orders of the defined types were placed in this encounter.   Disposition:  Follow up in 1 year(s)  Signed, Mertie Moores, MD  03/10/2019 8:50 AM    [facility]

## 2019-03-10 ENCOUNTER — Ambulatory Visit: Payer: PPO | Admitting: Cardiovascular Disease

## 2019-03-10 ENCOUNTER — Telehealth (INDEPENDENT_AMBULATORY_CARE_PROVIDER_SITE_OTHER): Payer: PPO | Admitting: Cardiovascular Disease

## 2019-03-10 ENCOUNTER — Other Ambulatory Visit: Payer: Self-pay

## 2019-03-10 ENCOUNTER — Encounter: Payer: Self-pay | Admitting: Cardiovascular Disease

## 2019-03-10 VITALS — BP 140/79 | HR 71 | Ht 64.25 in | Wt 142.0 lb

## 2019-03-10 DIAGNOSIS — I340 Nonrheumatic mitral (valve) insufficiency: Secondary | ICD-10-CM

## 2019-03-10 DIAGNOSIS — I480 Paroxysmal atrial fibrillation: Secondary | ICD-10-CM

## 2019-03-10 DIAGNOSIS — E782 Mixed hyperlipidemia: Secondary | ICD-10-CM

## 2019-03-10 DIAGNOSIS — I341 Nonrheumatic mitral (valve) prolapse: Secondary | ICD-10-CM

## 2019-03-10 DIAGNOSIS — Z7189 Other specified counseling: Secondary | ICD-10-CM | POA: Diagnosis not present

## 2019-03-10 DIAGNOSIS — Z7901 Long term (current) use of anticoagulants: Secondary | ICD-10-CM

## 2019-03-10 MED ORDER — DILTIAZEM HCL ER COATED BEADS 120 MG PO CP24: 120.0000 mg | ORAL_CAPSULE | Freq: Every day | ORAL | 3 refills | Status: DC

## 2019-03-10 MED ORDER — APIXABAN 5 MG PO TABS: 5.0000 mg | ORAL_TABLET | Freq: Two times a day (BID) | ORAL | 3 refills | Status: DC

## 2019-03-10 MED ORDER — FLECAINIDE ACETATE 100 MG PO TABS: ORAL_TABLET | ORAL | 3 refills | Status: DC

## 2019-03-10 NOTE — Patient Instructions (Addendum)
Medication Instructions:  Your physician recommends that you continue on your current medications as directed. Please refer to the Current Medication list given to you today.  If you need a refill on your cardiac medications before your next appointment, please call your pharmacy.    Lab work: Your physician recommends that you return for lab work on Tuesday June 30 at our office  You do not have to fast for this appointment   Testing/Procedures: None Ordered   Follow-Up: Your physician recommends that you return to our office for a Nurse visit for EKG on Tuesday June 30 at 12:15 pm  At [facility], you and your health needs are our priority.  As part of our continuing mission to provide you with exceptional heart care, we have created designated Provider Care Teams.  These Care Teams include your primary Cardiologist (physician) and Advanced Practice Providers (APPs -  Physician Assistants and Nurse Practitioners) who all work together to provide you with the care you need, when you need it. You will need a follow up appointment in:  1 years.  Please call our office 2 months in advance to schedule this appointment.  You may see Mertie Moores, MD or one of the following Advanced Practice Providers on your designated Care Team: Richardson Dopp, PA-C [place], [place] . Daune Perch, NP

## 2019-03-27 ENCOUNTER — Telehealth: Payer: Self-pay | Admitting: *Deleted

## 2019-03-27 NOTE — Telephone Encounter (Signed)
    COVID-19 Pre-Screening Questions:  . In the past 7 to 10 days have you had a cough,  shortness of breath, headache, congestion, fever (100 or greater) body aches, chills, sore throat, or sudden loss of taste or sense of smell? . Have you been around anyone with known Covid 19. . Have you been around anyone who is awaiting Covid 19 test results in the past 7 to 10 days? . Have you been around anyone who has been exposed to Covid 19, or has mentioned symptoms of Covid 19 within the past 7 to 10 days?  If you have any concerns/questions about symptoms patients report during screening (either on the phone or at threshold). Contact the provider seeing the patient or DOD for further guidance.  If neither are available contact a member of the leadership team.           Contacted patient via phone call. Answered all Covid 19 questions NO. Has a mask. KB Talk to Husband. KB

## 2019-04-01 ENCOUNTER — Other Ambulatory Visit: Payer: Self-pay

## 2019-04-01 ENCOUNTER — Other Ambulatory Visit: Payer: PPO

## 2019-04-01 ENCOUNTER — Ambulatory Visit (INDEPENDENT_AMBULATORY_CARE_PROVIDER_SITE_OTHER): Payer: PPO | Admitting: Nurse Practitioner

## 2019-04-01 VITALS — HR 65 | Resp 16 | Ht 65.0 in | Wt 145.5 lb

## 2019-04-01 DIAGNOSIS — E782 Mixed hyperlipidemia: Secondary | ICD-10-CM

## 2019-04-01 DIAGNOSIS — Z79899 Other long term (current) drug therapy: Secondary | ICD-10-CM

## 2019-04-01 DIAGNOSIS — I480 Paroxysmal atrial fibrillation: Secondary | ICD-10-CM

## 2019-04-01 DIAGNOSIS — Z7901 Long term (current) use of anticoagulants: Secondary | ICD-10-CM | POA: Diagnosis not present

## 2019-04-01 LAB — BASIC METABOLIC PANEL
BUN/Creatinine Ratio: 24 (ref 12–28)
BUN: 21 mg/dL (ref 8–27)
CO2: 23 mmol/L (ref 20–29)
Calcium: 9.6 mg/dL (ref 8.7–10.3)
Chloride: 101 mmol/L (ref 96–106)
Creatinine, Ser: 0.88 mg/dL (ref 0.57–1.00)
GFR calc Af Amer: 74 mL/min/{1.73_m2} (ref >59)
GFR calc non Af Amer: 64 mL/min/{1.73_m2} (ref >59)
Glucose: 101 mg/dL — ABNORMAL HIGH (ref 65–99)
Potassium: 4.4 mmol/L (ref 3.5–5.2)
Sodium: 142 mmol/L (ref 134–144)

## 2019-04-01 LAB — CBC
Hematocrit: 43.7 % (ref 34.0–46.6)
Hemoglobin: 14.7 g/dL (ref 11.1–15.9)
MCH: 30.4 pg (ref 26.6–33.0)
MCHC: 33.6 g/dL (ref 31.5–35.7)
MCV: 90 fL (ref 79–97)
Platelets: 287 10*3/uL (ref 150–450)
RBC: 4.84 x10E6/uL (ref 3.77–5.28)
RDW: 11.8 % (ref 11.7–15.4)
WBC: 7.5 10*3/uL (ref 3.4–10.8)

## 2019-04-01 NOTE — Progress Notes (Signed)
1.) Reason for visit: EKG   2.) Name of MD requesting visit: Dr. Acie Fredrickson  3.) H&P: Patient on flecainide for PAF; here for yearly EKG  4.) ROS related to problem: Patient denies complaints  5.) Assessment and plan per MD: EKG evaluated by Dr. Lovena Le, DOD who reports no problems and patient may continue current dose of flecainide

## 2019-04-01 NOTE — Patient Instructions (Signed)
Medication Instructions:  Your physician recommends that you continue on your current medications as directed. Please refer to the Current Medication list given to you today.  If you need a refill on your cardiac medications before your next appointment, please call your pharmacy.   Lab work: Done today  If you have labs (blood work) drawn today and your tests are completely normal, you will receive your results only by: Marland Kitchen MyChart Message (if you have MyChart) OR . A paper copy in the mail If you have any lab test that is abnormal or we need to change your treatment, we will call you to review the results.  Testing/Procedures: None Ordered   Follow-Up: At [facility], you and your health needs are our priority.  As part of our continuing mission to provide you with exceptional heart care, we have created designated Provider Care Teams.  These Care Teams include your primary Cardiologist (physician) and Advanced Practice Providers (APPs -  Physician Assistants and Nurse Practitioners) who all work together to provide you with the care you need, when you need it. You will need a follow up appointment in:  1 years.  Please call our office 2 months in advance to schedule this appointment.  You may see Mertie Moores, MD or one of the following Advanced Practice Providers on your designated Care Team: Richardson Dopp, PA-C [place], [place] . Daune Perch, NP

## 2019-06-06 DIAGNOSIS — Z23 Encounter for immunization: Secondary | ICD-10-CM | POA: Diagnosis not present

## 2019-06-27 ENCOUNTER — Other Ambulatory Visit: Payer: Self-pay | Admitting: Obstetrics & Gynecology

## 2019-06-27 NOTE — Telephone Encounter (Signed)
Medication refill request: Vitamin D Last AEX: 06/07/18 Next AEX: 10/10/19 Last MMG (if hormonal medication request): 02/19/19 Bi-rads 1 neg  Refill authorized: #12 with 1 RF

## 2019-07-01 DIAGNOSIS — D3002 Benign neoplasm of left kidney: Secondary | ICD-10-CM | POA: Diagnosis not present

## 2019-07-01 DIAGNOSIS — N200 Calculus of kidney: Secondary | ICD-10-CM | POA: Diagnosis not present

## 2019-07-24 DIAGNOSIS — R7301 Impaired fasting glucose: Secondary | ICD-10-CM | POA: Diagnosis not present

## 2019-07-24 DIAGNOSIS — E7849 Other hyperlipidemia: Secondary | ICD-10-CM | POA: Diagnosis not present

## 2019-07-24 DIAGNOSIS — E559 Vitamin D deficiency, unspecified: Secondary | ICD-10-CM | POA: Diagnosis not present

## 2019-07-25 DIAGNOSIS — R82998 Other abnormal findings in urine: Secondary | ICD-10-CM | POA: Diagnosis not present

## 2019-07-29 DIAGNOSIS — Z1212 Encounter for screening for malignant neoplasm of rectum: Secondary | ICD-10-CM | POA: Diagnosis not present

## 2019-07-31 DIAGNOSIS — Z1331 Encounter for screening for depression: Secondary | ICD-10-CM | POA: Diagnosis not present

## 2019-07-31 DIAGNOSIS — I341 Nonrheumatic mitral (valve) prolapse: Secondary | ICD-10-CM | POA: Diagnosis not present

## 2019-07-31 DIAGNOSIS — E785 Hyperlipidemia, unspecified: Secondary | ICD-10-CM | POA: Diagnosis not present

## 2019-07-31 DIAGNOSIS — R3121 Asymptomatic microscopic hematuria: Secondary | ICD-10-CM | POA: Diagnosis not present

## 2019-07-31 DIAGNOSIS — R7301 Impaired fasting glucose: Secondary | ICD-10-CM | POA: Diagnosis not present

## 2019-07-31 DIAGNOSIS — N200 Calculus of kidney: Secondary | ICD-10-CM | POA: Diagnosis not present

## 2019-07-31 DIAGNOSIS — C439 Malignant melanoma of skin, unspecified: Secondary | ICD-10-CM | POA: Diagnosis not present

## 2019-07-31 DIAGNOSIS — Z0000 Encounter for general adult medical examination without abnormal findings: Secondary | ICD-10-CM | POA: Diagnosis not present

## 2019-07-31 DIAGNOSIS — M8580 Other specified disorders of bone density and structure, unspecified site: Secondary | ICD-10-CM | POA: Diagnosis not present

## 2019-07-31 DIAGNOSIS — I480 Paroxysmal atrial fibrillation: Secondary | ICD-10-CM | POA: Diagnosis not present

## 2019-07-31 DIAGNOSIS — I340 Nonrheumatic mitral (valve) insufficiency: Secondary | ICD-10-CM | POA: Diagnosis not present

## 2019-10-08 ENCOUNTER — Other Ambulatory Visit: Payer: Self-pay

## 2019-10-10 ENCOUNTER — Ambulatory Visit (INDEPENDENT_AMBULATORY_CARE_PROVIDER_SITE_OTHER): Payer: PPO | Admitting: Obstetrics & Gynecology

## 2019-10-10 ENCOUNTER — Encounter: Payer: Self-pay | Admitting: Obstetrics & Gynecology

## 2019-10-10 ENCOUNTER — Other Ambulatory Visit: Payer: Self-pay

## 2019-10-10 VITALS — BP 138/76 | HR 84 | Temp 97.7°F | Ht 64.25 in | Wt 146.0 lb

## 2019-10-10 DIAGNOSIS — Z01419 Encounter for gynecological examination (general) (routine) without abnormal findings: Secondary | ICD-10-CM

## 2019-10-10 MED ORDER — VITAMIN D (ERGOCALCIFEROL) 1.25 MG (50000 UNIT) PO CAPS: ORAL_CAPSULE | ORAL | 4 refills | Status: DC

## 2019-10-10 MED ORDER — ESTRING 2 MG VA RING: 2.0000 mg | VAGINAL_RING | VAGINAL | 4 refills | Status: DC

## 2019-10-10 NOTE — Progress Notes (Signed)
79 y.o. [id number] [name] here for annual exam.  Doing well.  Son is living in [place].    She does have appt next Thursday at [facility] for Covid vaccination.   Patient states that Saturday when she used the restroom and saw droplets of bright red blood on the tissue. Per patient, her husband stated that the blood was coming from the labia.  He is a retired Stage manager and her daughter, who is a Paediatric nurse, thought it was a hemangioma.    PCP:  Dr. Joylene Draft.  Had virtual appt in November.    Patient's last menstrual period was 10/02/1997.          Sexually active: Yes.    The current method of family planning is post menopausal status.    Exercising: Yes.    treadmill and walking Smoker:  no  Health Maintenance: Pap:  06/07/18 Neg History of abnormal Pap:  Yes, h/o cryo MMG:  02/19/19 BIRADS 1 negative/density c Colonoscopy:  08/05/12 Normal. F/u 10 years. BMD:   10/13/13 Osteopenia.  She is going to repeat this in February.   TDaP:  PCP Pneumonia vaccine(s):  PCP Shingrix:   Completed per hx Hep C testing: n/a Screening Labs: PCP   reports that she has never smoked. She has never used smokeless tobacco. She reports current alcohol use of about 2.0 - 3.0 standard drinks of alcohol per week. She reports that she does not use drugs.  Past Medical History:  Diagnosis Date  . Abnormal Pap smear   . Atrial fibrillation ([place])   . Chronic kidney disease    hx of kidney stones  . Drug therapy    Flecainide therapy started March, 2011, treadmill March, 2011, no exercise-induced ectopy  . Ejection fraction    EF 55-65%, echo, October, 2009  . Hyperlipidemia   . Kidney stone   . Mitral regurgitation    Mild, echo, October, 2009  . MVP (mitral valve prolapse)    per pt "mild"    Past Surgical History:  Procedure Laterality Date  . COLONOSCOPY    . DILATATION & CURRETTAGE/HYSTEROSCOPY WITH RESECTOCOPE  08/20/2012   Procedure: [place];  Surgeon: Lyman Speller, MD;  Location: [facility];  Service: Gynecology;  Laterality: N/A;  . GYNECOLOGIC CRYOSURGERY  1980's  . HYSTEROSCOPY  11/3   polyp resection  . TONSILLECTOMY AND ADENOIDECTOMY    . varicose vein ablation     left    Current Outpatient Medications  Medication Sig Dispense Refill  . acetaminophen (TYLENOL) 500 MG tablet Take 500-1,000 mg by mouth every 6 (six) hours as needed for moderate pain or headache.    Marland Kitchen apixaban (ELIQUIS) 5 MG TABS tablet Take 1 tablet (5 mg total) by mouth 2 (two) times daily. 180 tablet 3  . B Complex Vitamins (VITAMIN B COMPLEX) TABS Take 1 tablet by mouth at bedtime.    Marland Kitchen diltiazem (CARDIZEM) 30 MG tablet Take 30 mg by mouth as needed.    . diltiazem (CARTIA XT) 120 MG 24 hr capsule Take 1 capsule (120 mg total) by mouth daily. 90 capsule 3  . estradiol (ESTRING) 2 MG vaginal ring Place 2 mg vaginally every 3 (three) months. 1 each 4  . flecainide (TAMBOCOR) 100 MG tablet TAKE 1 TABLET BY MOUTH EVERY 12 HOURS. PLEASE KEEP UPCOMING APPT IN JUNE 180 tablet 3  . Magnesium 500 MG TABS Take 1 tablet by mouth every other day. In PM    .  Multiple Vitamin (MULTIVITAMIN) capsule Take 1 capsule by mouth at bedtime.     . NON FORMULARY CBD oil - 500mg  twice a day    . PREVIDENT 5000 BOOSTER PLUS 1.1 % PSTE APPLY PEA SIZE AMOUNT ONTO BRUSH AND APPLY TO ALL TEETH SURFACE X2 MINS ONCE DAILY AND EXPECTORATE  4  . rosuvastatin (CRESTOR) 10 MG tablet Take 1 tablet daily by mouth.  3  . Vitamin D, Ergocalciferol, (DRISDOL) 1.25 MG (50000 UT) CAPS capsule TAKE 1 CAPSULE BY MOUTH EVERY 7 DAYS 12 capsule 1   No current facility-administered medications for this visit.    Family History  Problem Relation Age of Onset  . Parkinsonism Father   . Breast cancer Maternal Grandmother 23  . Breast cancer Sister   . Colon cancer Neg Hx   . Esophageal cancer Neg Hx   . Rectal cancer Neg Hx   . Stomach cancer Neg Hx     Review of Systems   All other systems reviewed and are negative.   Exam:   BP 138/76 (BP Location: Left Arm, Patient Position: Sitting, Cuff Size: Normal)   Pulse 84   Temp 97.7 F (36.5 C) (Temporal)   Ht 5' 4.25" (1.632 m)   Wt 146 lb (66.2 kg)   LMP 10/02/1997   BMI 24.87 kg/m   Height: 5' 4.25" (163.2 cm)  Ht Readings from Last 3 Encounters:  10/10/19 5' 4.25" (1.632 m)  04/01/19 5\' 5"  (1.651 m)  03/10/19 5' 4.25" (1.632 m)    General appearance: alert, cooperative and appears stated age Head: Normocephalic, without obvious abnormality, atraumatic Neck: no adenopathy, supple, symmetrical, trachea midline and thyroid normal to inspection and palpation Lungs: clear to auscultation bilaterally Breasts: normal appearance, no masses or tenderness Heart: regular rate and rhythm Abdomen: soft, non-tender; bowel sounds normal; no masses,  no organomegaly Extremities: extremities normal, atraumatic, no cyanosis or edema Skin: Skin color, texture, turgor normal. No rashes or lesions Lymph nodes: Cervical, supraclavicular, and axillary nodes normal. No abnormal inguinal nodes palpated Neurologic: Grossly normal  Pelvic: External genitalia:  no lesions              Urethra:  normal appearing urethra with no masses, tenderness or lesions              Bartholins and Skenes: normal                 Vagina: normal appearing vagina with normal color and discharge, no lesions              Cervix: no lesions              Pap taken: No. Bimanual Exam:  Uterus:  normal size, contour, position, consistency, mobility, non-tender              Adnexa: normal adnexa and no mass, fullness, tenderness               Rectovaginal: Confirms               Anus:  normal sphincter tone, no lesions  Chaperone, Terence Lux, CMA, was present for exam.  A:  Well Woman with normal exam PMP, no HRT H/o endometrial polyp s/p hysteroscopy with polyp resection 2013 H/o Vit D deficiency H/o uterine fibroids A fib, on  Eliquis  P:   Mammogram guidelines reviewed.  This is UTD. pap smear not indicated due to age Colonoscopy is UTD Estring vaginal ring 2mg  pv every 3-4 months.  #  1/3RF. Lab work done with Dr. Joylene Draft BMD planned for February On Vit D weekly.  Does not need Rx. Return annually or prn

## 2019-10-29 DIAGNOSIS — H524 Presbyopia: Secondary | ICD-10-CM | POA: Diagnosis not present

## 2019-10-29 DIAGNOSIS — H35373 Puckering of macula, bilateral: Secondary | ICD-10-CM | POA: Diagnosis not present

## 2019-10-29 DIAGNOSIS — H2513 Age-related nuclear cataract, bilateral: Secondary | ICD-10-CM | POA: Diagnosis not present

## 2019-10-29 DIAGNOSIS — H353132 Nonexudative age-related macular degeneration, bilateral, intermediate dry stage: Secondary | ICD-10-CM | POA: Diagnosis not present

## 2019-11-27 DIAGNOSIS — I480 Paroxysmal atrial fibrillation: Secondary | ICD-10-CM | POA: Diagnosis not present

## 2019-11-27 DIAGNOSIS — M8580 Other specified disorders of bone density and structure, unspecified site: Secondary | ICD-10-CM | POA: Diagnosis not present

## 2019-11-27 DIAGNOSIS — Z79899 Other long term (current) drug therapy: Secondary | ICD-10-CM | POA: Diagnosis not present

## 2019-12-16 DIAGNOSIS — N200 Calculus of kidney: Secondary | ICD-10-CM | POA: Diagnosis not present

## 2019-12-16 DIAGNOSIS — D3002 Benign neoplasm of left kidney: Secondary | ICD-10-CM | POA: Diagnosis not present

## 2020-01-21 ENCOUNTER — Other Ambulatory Visit: Payer: Self-pay | Admitting: Internal Medicine

## 2020-01-21 DIAGNOSIS — Z1231 Encounter for screening mammogram for malignant neoplasm of breast: Secondary | ICD-10-CM

## 2020-02-24 ENCOUNTER — Ambulatory Visit
Admission: RE | Admit: 2020-02-24 | Discharge: 2020-02-24 | Disposition: A | Discharge status: Home / Self Care | Payer: PPO | Source: Ambulatory Visit | Attending: Internal Medicine | Admitting: Internal Medicine

## 2020-02-24 ENCOUNTER — Other Ambulatory Visit: Payer: Self-pay

## 2020-02-24 DIAGNOSIS — Z1231 Encounter for screening mammogram for malignant neoplasm of breast: Secondary | ICD-10-CM | POA: Diagnosis not present

## 2020-03-11 ENCOUNTER — Encounter: Payer: Self-pay | Admitting: Cardiovascular Disease

## 2020-03-11 ENCOUNTER — Ambulatory Visit: Payer: PPO | Admitting: Cardiovascular Disease

## 2020-03-11 ENCOUNTER — Other Ambulatory Visit: Payer: Self-pay

## 2020-03-11 VITALS — BP 108/72 | HR 66 | Ht 65.0 in | Wt 146.8 lb

## 2020-03-11 DIAGNOSIS — I480 Paroxysmal atrial fibrillation: Secondary | ICD-10-CM

## 2020-03-11 DIAGNOSIS — I340 Nonrheumatic mitral (valve) insufficiency: Secondary | ICD-10-CM | POA: Diagnosis not present

## 2020-03-11 DIAGNOSIS — Z79899 Other long term (current) drug therapy: Secondary | ICD-10-CM | POA: Diagnosis not present

## 2020-03-11 DIAGNOSIS — Z7901 Long term (current) use of anticoagulants: Secondary | ICD-10-CM | POA: Diagnosis not present

## 2020-03-11 DIAGNOSIS — E782 Mixed hyperlipidemia: Secondary | ICD-10-CM

## 2020-03-11 DIAGNOSIS — I341 Nonrheumatic mitral (valve) prolapse: Secondary | ICD-10-CM | POA: Diagnosis not present

## 2020-03-11 LAB — BASIC METABOLIC PANEL
BUN/Creatinine Ratio: 23 (ref 12–28)
BUN: 19 mg/dL (ref 8–27)
CO2: 26 mmol/L (ref 20–29)
Calcium: 9.4 mg/dL (ref 8.7–10.3)
Chloride: 102 mmol/L (ref 96–106)
Creatinine, Ser: 0.81 mg/dL (ref 0.57–1.00)
GFR calc Af Amer: 81 mL/min/{1.73_m2} (ref >59)
GFR calc non Af Amer: 70 mL/min/{1.73_m2} (ref >59)
Glucose: 92 mg/dL (ref 65–99)
Potassium: 4.4 mmol/L (ref 3.5–5.2)
Sodium: 141 mmol/L (ref 134–144)

## 2020-03-11 LAB — CBC
Hematocrit: 40 % (ref 34.0–46.6)
Hemoglobin: 13.4 g/dL (ref 11.1–15.9)
MCH: 29.6 pg (ref 26.6–33.0)
MCHC: 33.5 g/dL (ref 31.5–35.7)
MCV: 89 fL (ref 79–97)
Platelets: 285 10*3/uL (ref 150–450)
RBC: 4.52 x10E6/uL (ref 3.77–5.28)
RDW: 12 % (ref 11.7–15.4)
WBC: 5.6 10*3/uL (ref 3.4–10.8)

## 2020-03-11 MED ORDER — DILTIAZEM HCL ER COATED BEADS 120 MG PO CP24: 120.0000 mg | ORAL_CAPSULE | Freq: Every day | ORAL | 3 refills | Status: DC

## 2020-03-11 MED ORDER — FLECAINIDE ACETATE 100 MG PO TABS: ORAL_TABLET | ORAL | 3 refills | Status: DC

## 2020-03-11 MED ORDER — APIXABAN 5 MG PO TABS: 5.0000 mg | ORAL_TABLET | Freq: Two times a day (BID) | ORAL | 3 refills | Status: DC

## 2020-03-11 NOTE — Progress Notes (Signed)
Cardiology Office Note   Date:  03/11/2020   ID:  [name], [name], [name], [name]  PCP:  Crist Infante, MD  Cardiologist:   Mertie Moores, MD   (former Ron Parker patient )   Chief Complaint  Patient presents with  . Atrial Fibrillation   Problem List 1. Paroxysmal atrial fib  2. Hyperlipidemia 3. Mitral valve prolapse with mild MR     [name] is a 79 y.o. female who presents to establish care. She has seen Dr. Ron Parker in the past. Has a hx of paroxysmal atrial fib   She has been stable on Flecainide  Oct. 5, 2017:  [name] is seen for follow up of her PAF  Spent the summer in [place]  Has been keeping up with her BP.  [name], 2018  Seen for follow up of her atrial fib. Presented to the ER with rapid atrial fib.   Received IV Dilt and was cardioverted in the ER .  Was seen in the atrial fib clinic - Flecainide was increased to 100 mg BID  Has felt well since then  We discussed Afib ablation   Mar 01, 2018:  [name] is doing well.  She has maintained normal sinus rhythm.  She is tolerating flecainide 100 mg twice a day. She remains very active and exercises regularly. Watches her salt .  March 11, 2020: [name] is seen today for follow-up of her paroxysmal atrial fibrillation. She has been fairly well controlled  Has been active.  Goes to [employer] regularly  Husband [name] is being treated for pulmonary fibrosis   Past Medical History:  Diagnosis Date  . Abnormal Pap smear   . Atrial fibrillation ([place])   . Chronic kidney disease    hx of kidney stones  . Drug therapy    Flecainide therapy started March, 2011, treadmill March, 2011, no exercise-induced ectopy  . Ejection fraction    EF 55-65%, echo, October, 2009  . Hyperlipidemia   . Kidney stone   . Mitral regurgitation    Mild, echo, October, 2009  . MVP (mitral valve prolapse)    per pt "mild"    Past Surgical History:  Procedure Laterality Date  .  COLONOSCOPY    . DILATATION & CURRETTAGE/HYSTEROSCOPY WITH RESECTOCOPE  08/20/2012   Procedure: [place];  Surgeon: Lyman Speller, MD;  Location: [facility];  Service: Gynecology;  Laterality: N/A;  . GYNECOLOGIC CRYOSURGERY  1980's  . HYSTEROSCOPY  11/3   polyp resection  . TONSILLECTOMY AND ADENOIDECTOMY    . varicose vein ablation     left     Current Outpatient Medications  Medication Sig Dispense Refill  . acetaminophen (TYLENOL) 500 MG tablet Take 500-1,000 mg by mouth every 6 (six) hours as needed for moderate pain or headache.    Marland Kitchen apixaban (ELIQUIS) 5 MG TABS tablet Take 1 tablet (5 mg total) by mouth 2 (two) times daily. 180 tablet 3  . B Complex Vitamins (VITAMIN B COMPLEX) TABS Take 1 tablet by mouth at bedtime.    Marland Kitchen diltiazem (CARTIA XT) 120 MG 24 hr capsule Take 1 capsule (120 mg total) by mouth daily. 90 capsule 3  . estradiol (ESTRING) 2 MG vaginal ring Place 2 mg vaginally every 3 (three) months. 1 each 4  . flecainide (TAMBOCOR) 100 MG tablet TAKE 1 TABLET BY MOUTH EVERY 12 HOURS. PLEASE KEEP UPCOMING APPT IN JUNE 180 tablet 3  . Magnesium 500 MG TABS  Take 1 tablet by mouth every other day. In PM    . Multiple Vitamin (MULTIVITAMIN) capsule Take 1 capsule by mouth at bedtime.     . NON FORMULARY CBD oil - 500mg  twice a day    . PREVIDENT 5000 BOOSTER PLUS 1.1 % PSTE APPLY PEA SIZE AMOUNT ONTO BRUSH AND APPLY TO ALL TEETH SURFACE X2 MINS ONCE DAILY AND EXPECTORATE  4  . rosuvastatin (CRESTOR) 10 MG tablet Take 1 tablet daily by mouth.  3  . Vitamin D, Ergocalciferol, (DRISDOL) 1.25 MG (50000 UT) CAPS capsule TAKE 1 CAPSULE BY MOUTH EVERY 7 DAYS 12 capsule 4   No current facility-administered medications for this visit.    Allergies:   Tetanus toxoid    Social History:  The patient  reports that she has never smoked. She has never used smokeless tobacco. She reports current alcohol use of about 2.0 - 3.0 standard drinks of  alcohol per week. She reports that she does not use drugs.   Family History:  The patient's family history includes Breast cancer in her sister; Breast cancer (age of onset: 45) in her maternal grandmother; Parkinsonism in her father.   ROS: Noted in current history, otherwise review of systems is negative.   Physical Exam:   Blood pressure 108/72, pulse 66, height 5\' 5"  (1.651 m), weight 146 lb 12 oz (66.6 kg), last menstrual period 10/02/1997, SpO2 98 %.  GEN:  Well nourished, well developed in no acute distress HEENT: Normal NECK: No JVD; No carotid bruits LYMPHATICS: No lymphadenopathy CARDIAC: RRR , no murmurs, rubs, gallops RESPIRATORY:  Clear to auscultation without rales, wheezing or rhonchi  ABDOMEN: Soft, non-tender, non-distended MUSCULOSKELETAL:  No edema; No deformity  SKIN: Warm and dry NEUROLOGIC:  Alert and oriented x 3   EKG:    March 11, 2020: Normal sinus rhythm at 66.  First-degree AV block with PR interval of 208 ms.  No ST or T wave changes.  QRS is narrow.     Recent Labs: 04/01/2019: BUN 21; Creatinine, Ser 0.88; Hemoglobin 14.7; Platelets 287; Potassium 4.4; Sodium 142    Lipid Panel No results found for: CHOL, TRIG, HDL, CHOLHDL, VLDL, LDLCALC, LDLDIRECT    Wt Readings from Last 3 Encounters:  03/11/20 146 lb 12 oz (66.6 kg)  10/10/19 146 lb (66.2 kg)  04/01/19 145 lb 8 oz (66 kg)      Other studies Reviewed: Additional studies/ records that were reviewed today include: . Review of the above records demonstrates:    ASSESSMENT AND PLAN:  1. Paroxysmal atrial fib -  Doing well   ,  In NSR today   2. Hyperlipidemia-   Managed by Dr. Joylene Draft   3. Mitral valve prolapse with mild MR -   Asymptomatic    Current medicines are reviewed at length with the patient today.  The patient does not have concerns regarding medicines.  The following changes have been made:  no change  Labs/ tests ordered today include:   No orders of the defined  types were placed in this encounter.    Disposition:   FU in 1 year     Mertie Moores, MD  03/11/2020 9:05 AM    [address], [place], [place]  [address] Phone: [phone]; Fax: [phone]

## 2020-03-11 NOTE — Patient Instructions (Signed)
Medication Instructions:  Your physician recommends that you continue on your current medications as directed. Please refer to the Current Medication list given to you today.  *If you need a refill on your cardiac medications before your next appointment, please call your pharmacy*   Lab Work: TODAY - CBC, BMET If you have labs (blood work) drawn today and your tests are completely normal, you will receive your results only by: Marland Kitchen MyChart Message (if you have MyChart) OR . A paper copy in the mail If you have any lab test that is abnormal or we need to change your treatment, we will call you to review the results.   Testing/Procedures: None Ordered   Follow-Up: At [facility], you and your health needs are our priority.  As part of our continuing mission to provide you with exceptional heart care, we have created designated Provider Care Teams.  These Care Teams include your primary Cardiologist (physician) and Advanced Practice Providers (APPs -  Physician Assistants and Nurse Practitioners) who all work together to provide you with the care you need, when you need it.  We recommend signing up for the patient portal called "MyChart".  Sign up information is provided on this After Visit Summary.  MyChart is used to connect with patients for Virtual Visits (Telemedicine).  Patients are able to view lab/test results, encounter notes, upcoming appointments, etc.  Non-urgent messages can be sent to your provider as well.   To learn more about what you can do with MyChart, go to [email or web address].    Your next appointment:   1 year(s)  The format for your next appointment:   In Person  Provider:   You may see Mertie Moores, MD or one of the following Advanced Practice Providers on your designated Care Team:    Richardson Dopp, PA-C  [place], [place]

## 2020-03-15 DIAGNOSIS — Z85820 Personal history of malignant melanoma of skin: Secondary | ICD-10-CM | POA: Diagnosis not present

## 2020-03-15 DIAGNOSIS — L821 Other seborrheic keratosis: Secondary | ICD-10-CM | POA: Diagnosis not present

## 2020-03-15 DIAGNOSIS — D1801 Hemangioma of skin and subcutaneous tissue: Secondary | ICD-10-CM | POA: Diagnosis not present

## 2020-03-15 DIAGNOSIS — L812 Freckles: Secondary | ICD-10-CM | POA: Diagnosis not present

## 2020-03-15 DIAGNOSIS — L570 Actinic keratosis: Secondary | ICD-10-CM | POA: Diagnosis not present

## 2020-05-03 ENCOUNTER — Other Ambulatory Visit: Payer: Self-pay

## 2020-05-03 ENCOUNTER — Encounter: Payer: Self-pay | Admitting: Obstetrics & Gynecology

## 2020-05-03 ENCOUNTER — Telehealth: Payer: Self-pay | Admitting: Obstetrics & Gynecology

## 2020-05-03 ENCOUNTER — Ambulatory Visit: Payer: PPO | Admitting: Obstetrics & Gynecology

## 2020-05-03 VITALS — BP 118/64 | HR 68 | Resp 16 | Wt 148.0 lb

## 2020-05-03 DIAGNOSIS — N9089 Other specified noninflammatory disorders of vulva and perineum: Secondary | ICD-10-CM | POA: Diagnosis not present

## 2020-05-03 MED ORDER — TERCONAZOLE 0.4 % VA CREA: TOPICAL_CREAM | VAGINAL | 0 refills | Status: DC

## 2020-05-03 MED ORDER — TRIAMCINOLONE ACETONIDE 0.025 % EX OINT: 1.0000 "application " | TOPICAL_OINTMENT | Freq: Two times a day (BID) | CUTANEOUS | 0 refills | Status: DC

## 2020-05-03 NOTE — Telephone Encounter (Signed)
Patient thinks she may have a yeast infection and would like to come in for an appointment today. No appointments available with Dr.Miller today. Patient stated she would see any available provider today due to going out of town tomorrow morning.

## 2020-05-03 NOTE — Progress Notes (Signed)
GYNECOLOGY  VISIT  CC:   Vulvar irritation  HPI: 79 y.o. G53P2002 [name] here for vaginitis that started about Thursday.  Reports she first noticed then when she got up in the middle of the night to void.  She used some aquaphor for a 2 1/2 days.  Symptoms are "not terrible" but "not well".  They are having to the beach tomorrow.  She's has increased water and cranberry juice and this really hasn't done anything.  GYNECOLOGIC HISTORY: Patient's last menstrual period was 10/02/1997. Contraception: postmenopausal Menopausal hormone therapy: estring  Patient Active Problem List   Diagnosis Date Noted  . Mitral valve prolapse 03/10/2019  . CN (constipation) 02/17/2015  . Spider veins of both lower extremities 08/03/2014  . Varicose veins of lower extremities with other complications 89/38/1017  . Hyperlipidemia   . Atrial fibrillation ([place])   . Mitral regurgitation     Past Medical History:  Diagnosis Date  . Abnormal Pap smear   . Atrial fibrillation ([facility])   . Chronic kidney disease    hx of kidney stones  . Drug therapy    Flecainide therapy started March, 2011, treadmill March, 2011, no exercise-induced ectopy  . Ejection fraction    EF 55-65%, echo, October, 2009  . Hyperlipidemia   . Kidney stone   . Mitral regurgitation    Mild, echo, October, 2009  . MVP (mitral valve prolapse)    per pt "mild"    Past Surgical History:  Procedure Laterality Date  . COLONOSCOPY    . DILATATION & CURRETTAGE/HYSTEROSCOPY WITH RESECTOCOPE  08/20/2012   Procedure: [place];  Surgeon: Lyman Speller, MD;  Location: [facility];  Service: Gynecology;  Laterality: N/A;  . GYNECOLOGIC CRYOSURGERY  1980's  . HYSTEROSCOPY  11/3   polyp resection  . TONSILLECTOMY AND ADENOIDECTOMY    . varicose vein ablation     left    MEDS:   Current Outpatient Medications on File Prior to Visit  Medication Sig Dispense Refill  .  acetaminophen (TYLENOL) 500 MG tablet Take 500-1,000 mg by mouth every 6 (six) hours as needed for moderate pain or headache.    Marland Kitchen apixaban (ELIQUIS) 5 MG TABS tablet Take 1 tablet (5 mg total) by mouth 2 (two) times daily. 180 tablet 3  . B Complex Vitamins (VITAMIN B COMPLEX) TABS Take 1 tablet by mouth at bedtime.    Marland Kitchen diltiazem (CARTIA XT) 120 MG 24 hr capsule Take 1 capsule (120 mg total) by mouth daily. 90 capsule 3  . estradiol (ESTRING) 2 MG vaginal ring Place 2 mg vaginally every 3 (three) months. 1 each 4  . flecainide (TAMBOCOR) 100 MG tablet TAKE 1 TABLET BY MOUTH EVERY 12 HOURS. 180 tablet 3  . Magnesium 500 MG TABS Take 1 tablet by mouth every other day. In PM    . Multiple Vitamin (MULTIVITAMIN) capsule Take 1 capsule by mouth at bedtime.     . NON FORMULARY CBD oil - 500mg  twice a day    . PREVIDENT 5000 BOOSTER PLUS 1.1 % PSTE APPLY PEA SIZE AMOUNT ONTO BRUSH AND APPLY TO ALL TEETH SURFACE X2 MINS ONCE DAILY AND EXPECTORATE  4  . rosuvastatin (CRESTOR) 10 MG tablet Take 1 tablet daily by mouth.  3  . Vitamin D, Ergocalciferol, (DRISDOL) 1.25 MG (50000 UT) CAPS capsule TAKE 1 CAPSULE BY MOUTH EVERY 7 DAYS 12 capsule 4   No current facility-administered medications on file prior to visit.  ALLERGIES: Patient has no active allergies.  Family History  Problem Relation Age of Onset  . Parkinsonism Father   . Breast cancer Maternal Grandmother 27  . Breast cancer Sister     SH:  Married, non smoker  Review of Systems  Constitutional: Negative.   HENT: Negative.   Eyes: Negative.   Respiratory: Negative.   Cardiovascular: Negative.   Gastrointestinal: Negative.   Endocrine: Negative.   Genitourinary:       Burning on vulva  Musculoskeletal: Negative.   Skin: Negative.   Allergic/Immunologic: Negative.   Neurological: Negative.   Hematological: Negative.   Psychiatric/Behavioral: Negative.     PHYSICAL EXAMINATION:    BP 118/64   Pulse 68   Resp 16   Wt  148 lb (67.1 kg)   LMP 10/02/1997   BMI 24.63 kg/m     General appearance: alert, cooperative and appears stated age Lymph:  no inguinal LAD noted  Pelvic: External genitalia:  no lesions but erythema of inner lab majora noted              Urethra:  normal appearing urethra with no masses, tenderness or lesions              Bartholins and Skenes: normal                 Vagina: normal appearing vagina with normal color, whitish and water discharge noted, no lesions              Cervix: no lesions              Bimanual Exam:  Uterus:  normal size, contour, position, consistency, mobility, non-tender              Adnexa: no mass, fullness, tenderness               Chaperone, Terence Lux, CMA, was present for exam.  Assessment: Vulvar irritation Watery and white discharge present Uses Estring but leaves in for longer then 90 days due to cost  Plan: Affirm obtained.  As she is going to the beach first thing tomorrow, rx for both terazol 7 and topical steroid given.  Depending on results, she will be called and advised what specifically to start. Pt preferrs a phone call on her cell as she will be at the beach.  Number confirmed

## 2020-05-03 NOTE — Telephone Encounter (Signed)
Spoke with patient. Patient reports burning on labial folds that started on 7/31. Has tried Aquaphor, no relief of symptoms. Denies urinary symptoms, vaginal odor, d/c or bleeding. Patient is requesting an OV with any available provider today, is traveling out of town in the morning. OV scheduled for today at 3:30pm with Dr. Sabra Heck. Patient verbalizes understanding and is agreeable.   Last AEX 10/10/19.   OV scheduled for today, will close encounter.

## 2020-05-04 ENCOUNTER — Telehealth: Payer: Self-pay

## 2020-05-04 DIAGNOSIS — H52203 Unspecified astigmatism, bilateral: Secondary | ICD-10-CM | POA: Diagnosis not present

## 2020-05-04 DIAGNOSIS — H353132 Nonexudative age-related macular degeneration, bilateral, intermediate dry stage: Secondary | ICD-10-CM | POA: Diagnosis not present

## 2020-05-04 LAB — VAGINITIS/VAGINOSIS, DNA PROBE
Candida Species: POSITIVE — AB
Gardnerella vaginalis: NEGATIVE
Trichomonas vaginosis: NEGATIVE

## 2020-05-04 NOTE — Telephone Encounter (Signed)
Patient returned call

## 2020-05-04 NOTE — Telephone Encounter (Signed)
Left message for call back.

## 2020-05-04 NOTE — Telephone Encounter (Signed)
Patient notified of results as written by provider 

## 2020-05-04 NOTE — Telephone Encounter (Signed)
-----   Message from Megan Salon, MD sent at 05/04/2020  8:32 AM EDT ----- Please let [name] know her testing showed yeast.  I sent in a prescription for terconazole and a topical steroid.  Please let her know she doesn't need to use the steroid.  If symptoms fully resolve, she does not need any follow up for this issue/concern.  She asked to be called on her cell phone and not home phone as she is heading for the beach this morning.  Thanks.

## 2020-07-03 DIAGNOSIS — Z23 Encounter for immunization: Secondary | ICD-10-CM | POA: Diagnosis not present

## 2020-07-29 DIAGNOSIS — R7301 Impaired fasting glucose: Secondary | ICD-10-CM | POA: Diagnosis not present

## 2020-07-29 DIAGNOSIS — E559 Vitamin D deficiency, unspecified: Secondary | ICD-10-CM | POA: Diagnosis not present

## 2020-07-29 DIAGNOSIS — E785 Hyperlipidemia, unspecified: Secondary | ICD-10-CM | POA: Diagnosis not present

## 2020-08-03 ENCOUNTER — Other Ambulatory Visit: Payer: Self-pay | Admitting: Internal Medicine

## 2020-08-03 DIAGNOSIS — R3121 Asymptomatic microscopic hematuria: Secondary | ICD-10-CM | POA: Diagnosis not present

## 2020-08-03 DIAGNOSIS — I340 Nonrheumatic mitral (valve) insufficiency: Secondary | ICD-10-CM | POA: Diagnosis not present

## 2020-08-03 DIAGNOSIS — Z1331 Encounter for screening for depression: Secondary | ICD-10-CM | POA: Diagnosis not present

## 2020-08-03 DIAGNOSIS — E785 Hyperlipidemia, unspecified: Secondary | ICD-10-CM

## 2020-08-03 DIAGNOSIS — I480 Paroxysmal atrial fibrillation: Secondary | ICD-10-CM | POA: Diagnosis not present

## 2020-08-03 DIAGNOSIS — E559 Vitamin D deficiency, unspecified: Secondary | ICD-10-CM | POA: Diagnosis not present

## 2020-08-03 DIAGNOSIS — I341 Nonrheumatic mitral (valve) prolapse: Secondary | ICD-10-CM | POA: Diagnosis not present

## 2020-08-03 DIAGNOSIS — D6869 Other thrombophilia: Secondary | ICD-10-CM | POA: Diagnosis not present

## 2020-08-03 DIAGNOSIS — R82998 Other abnormal findings in urine: Secondary | ICD-10-CM | POA: Diagnosis not present

## 2020-08-03 DIAGNOSIS — M8580 Other specified disorders of bone density and structure, unspecified site: Secondary | ICD-10-CM | POA: Diagnosis not present

## 2020-08-03 DIAGNOSIS — R7301 Impaired fasting glucose: Secondary | ICD-10-CM | POA: Diagnosis not present

## 2020-08-03 DIAGNOSIS — Z0000 Encounter for general adult medical examination without abnormal findings: Secondary | ICD-10-CM | POA: Diagnosis not present

## 2020-08-20 ENCOUNTER — Ambulatory Visit
Admission: RE | Admit: 2020-08-20 | Discharge: 2020-08-20 | Disposition: A | Discharge status: Home / Self Care | Payer: No Typology Code available for payment source | Source: Ambulatory Visit | Attending: Internal Medicine | Admitting: Internal Medicine

## 2020-08-20 DIAGNOSIS — E785 Hyperlipidemia, unspecified: Secondary | ICD-10-CM

## 2020-09-08 DIAGNOSIS — Z1212 Encounter for screening for malignant neoplasm of rectum: Secondary | ICD-10-CM | POA: Diagnosis not present

## 2020-11-02 ENCOUNTER — Other Ambulatory Visit: Payer: Self-pay | Admitting: Obstetrics & Gynecology

## 2020-11-03 DIAGNOSIS — H353132 Nonexudative age-related macular degeneration, bilateral, intermediate dry stage: Secondary | ICD-10-CM | POA: Diagnosis not present

## 2020-11-03 DIAGNOSIS — H2513 Age-related nuclear cataract, bilateral: Secondary | ICD-10-CM | POA: Diagnosis not present

## 2020-11-14 ENCOUNTER — Other Ambulatory Visit: Payer: Self-pay | Admitting: Obstetrics & Gynecology

## 2020-11-24 DIAGNOSIS — D3002 Benign neoplasm of left kidney: Secondary | ICD-10-CM | POA: Diagnosis not present

## 2020-12-03 DIAGNOSIS — M5136 Other intervertebral disc degeneration, lumbar region: Secondary | ICD-10-CM | POA: Diagnosis not present

## 2020-12-03 DIAGNOSIS — D3002 Benign neoplasm of left kidney: Secondary | ICD-10-CM | POA: Diagnosis not present

## 2020-12-03 DIAGNOSIS — R300 Dysuria: Secondary | ICD-10-CM | POA: Diagnosis not present

## 2020-12-03 DIAGNOSIS — D1771 Benign lipomatous neoplasm of kidney: Secondary | ICD-10-CM | POA: Diagnosis not present

## 2020-12-03 DIAGNOSIS — N200 Calculus of kidney: Secondary | ICD-10-CM | POA: Diagnosis not present

## 2020-12-07 DIAGNOSIS — N200 Calculus of kidney: Secondary | ICD-10-CM | POA: Diagnosis not present

## 2020-12-07 DIAGNOSIS — D3002 Benign neoplasm of left kidney: Secondary | ICD-10-CM | POA: Diagnosis not present

## 2020-12-23 ENCOUNTER — Other Ambulatory Visit (HOSPITAL_BASED_OUTPATIENT_CLINIC_OR_DEPARTMENT_OTHER)
Admission: RE | Admit: 2020-12-23 | Discharge: 2020-12-23 | Disposition: A | Discharge status: Home / Self Care | Payer: PPO | Source: Ambulatory Visit | Attending: Obstetrics & Gynecology | Admitting: Obstetrics & Gynecology

## 2020-12-23 ENCOUNTER — Other Ambulatory Visit: Payer: Self-pay

## 2020-12-23 ENCOUNTER — Ambulatory Visit: Payer: PPO

## 2020-12-23 ENCOUNTER — Encounter (HOSPITAL_BASED_OUTPATIENT_CLINIC_OR_DEPARTMENT_OTHER): Payer: Self-pay | Admitting: Obstetrics & Gynecology

## 2020-12-23 ENCOUNTER — Ambulatory Visit (INDEPENDENT_AMBULATORY_CARE_PROVIDER_SITE_OTHER): Payer: PPO | Admitting: Obstetrics & Gynecology

## 2020-12-23 VITALS — BP 151/79 | HR 72 | Ht 64.25 in | Wt 145.0 lb

## 2020-12-23 DIAGNOSIS — D251 Intramural leiomyoma of uterus: Secondary | ICD-10-CM | POA: Insufficient documentation

## 2020-12-23 DIAGNOSIS — R3129 Other microscopic hematuria: Secondary | ICD-10-CM | POA: Insufficient documentation

## 2020-12-23 DIAGNOSIS — E559 Vitamin D deficiency, unspecified: Secondary | ICD-10-CM | POA: Diagnosis not present

## 2020-12-23 DIAGNOSIS — N952 Postmenopausal atrophic vaginitis: Secondary | ICD-10-CM

## 2020-12-23 DIAGNOSIS — Z01419 Encounter for gynecological examination (general) (routine) without abnormal findings: Secondary | ICD-10-CM

## 2020-12-23 DIAGNOSIS — R350 Frequency of micturition: Secondary | ICD-10-CM | POA: Insufficient documentation

## 2020-12-23 DIAGNOSIS — Z9889 Other specified postprocedural states: Secondary | ICD-10-CM | POA: Insufficient documentation

## 2020-12-23 LAB — POCT URINALYSIS DIPSTICK
Appearance: NORMAL
Bilirubin, UA: NEGATIVE
Glucose, UA: NEGATIVE
Ketones, UA: NEGATIVE
Leukocytes, UA: NEGATIVE
Nitrite, UA: NEGATIVE
Protein, UA: NEGATIVE
Spec Grav, UA: 1.025 (ref 1.010–1.025)
Urobilinogen, UA: 0.2 E.U./dL
pH, UA: 5 (ref 5.0–8.0)

## 2020-12-23 MED ORDER — VITAMIN D (ERGOCALCIFEROL) 1.25 MG (50000 UNIT) PO CAPS: ORAL_CAPSULE | ORAL | 4 refills | Status: DC

## 2020-12-23 MED ORDER — ESTRING 2 MG VA RING: 2.0000 mg | VAGINAL_RING | VAGINAL | 4 refills | Status: DC

## 2020-12-23 NOTE — Progress Notes (Signed)
[name] is a 80 y.o. female complains of hematuria. Urine sample was collected and sent for culture.  Last pap- 06/07/2018 Last mammo- 02/24/2020 Last colonoscopy- 08/15/2012 Last Dexa- 10/14/2011

## 2020-12-23 NOTE — Progress Notes (Signed)
80 y.o. G72P2002 [name] here for breast and pelvic exam.  Had fall with walking her dog.  Had a little rectal bleeding and some blood in her urine.  Rectal bleeding stopped in 2 days.  She did still have some macroscopic hematuria.  Had CT scheduled for history of kidney stones follow up with Dr. Tresa Moore.  Had CT scan 12/03/2020.  She does have some renal stones.  She typically doesn't see any blood in the urine in the morning but it becomes present during the day.  This is still present after three weeks.  Has not had cystoscopy.  She and I discussed this today.    Denies vaginal bleeding.  Husband diagnosed with pulmonary fibrosis.  He is also being treated for prostate cancer.  He has been good.  Not really SA at this time.  She is still using Estring.  D/w pt stopping if not going to be SA in the future (depending on response to prostate cancer treatments).  Pt in agreement with this.  Patient's last menstrual period was 10/02/1997.          Sexually active: No.  H/O STD:  no  Health Maintenance: PCP:  [facility].  Last wellness appt was 09/01/2020.  Did blood work at that appt:  yes Vaccines are up to date:  Yes but I do not have dates of tdap and pneumonia vaccination Colonoscopy:  2013 follow up 10 years MMG:  02/24/2020 BMD:  2013, she is not interested in any treatment Last pap smear:  2019   H/o abnormal pap smear:  Remote hx of cryo   reports that she has never smoked. She has never used smokeless tobacco. She reports current alcohol use. She reports that she does not use drugs.  Past Medical History:  Diagnosis Date  . Abnormal Pap smear   . Atrial fibrillation ([place])   . Chronic kidney disease    hx of kidney stones  . Drug therapy    Flecainide therapy started March, 2011, treadmill March, 2011, no exercise-induced ectopy  . Ejection fraction    EF 55-65%, echo, October, 2009  . Hyperlipidemia   . Kidney stone   . Mitral regurgitation    Mild, echo,  October, 2009  . MVP (mitral valve prolapse)    per pt "mild"    Past Surgical History:  Procedure Laterality Date  . COLONOSCOPY    . DILATATION & CURRETTAGE/HYSTEROSCOPY WITH RESECTOCOPE  08/20/2012   Procedure: [place];  Surgeon: Lyman Speller, MD;  Location: [facility];  Service: Gynecology;  Laterality: N/A;  . GYNECOLOGIC CRYOSURGERY  1980's  . HYSTEROSCOPY  11/3   polyp resection  . TONSILLECTOMY AND ADENOIDECTOMY    . varicose vein ablation     left    Current Outpatient Medications  Medication Sig Dispense Refill  . acetaminophen (TYLENOL) 500 MG tablet Take 500-1,000 mg by mouth every 6 (six) hours as needed for moderate pain or headache.    Marland Kitchen apixaban (ELIQUIS) 5 MG TABS tablet Take 1 tablet (5 mg total) by mouth 2 (two) times daily. 180 tablet 3  . B Complex Vitamins (VITAMIN B COMPLEX) TABS Take 1 tablet by mouth at bedtime.    Marland Kitchen diltiazem (CARTIA XT) 120 MG 24 hr capsule Take 1 capsule (120 mg total) by mouth daily. 90 capsule 3  . estradiol (ESTRING) 2 MG vaginal ring Place 2 mg vaginally every 3 (three) months. 1 each 4  . flecainide (  TAMBOCOR) 100 MG tablet TAKE 1 TABLET BY MOUTH EVERY 12 HOURS. 180 tablet 3  . Magnesium 500 MG TABS Take 1 tablet by mouth every other day. In PM    . Multiple Vitamin (MULTIVITAMIN) capsule Take 1 capsule by mouth at bedtime.    . Multiple Vitamins-Minerals (ICAPS AREDS 2 PO) Take by mouth.    . NON FORMULARY CBD oil - 500mg  twice a day    . PREVIDENT 5000 BOOSTER PLUS 1.1 % PSTE APPLY PEA SIZE AMOUNT ONTO BRUSH AND APPLY TO ALL TEETH SURFACE X2 MINS ONCE DAILY AND EXPECTORATE  4  . rosuvastatin (CRESTOR) 10 MG tablet Take 1 tablet daily by mouth.  3  . Vitamin D, Ergocalciferol, (DRISDOL) 1.25 MG (50000 UT) CAPS capsule TAKE 1 CAPSULE BY MOUTH EVERY 7 DAYS 12 capsule 4  . terconazole (TERAZOL 7) 0.4 % vaginal cream Apply externally twice daily for 7 days (Patient not taking: Reported on  12/23/2020) 45 g 0  . triamcinolone (KENALOG) 0.025 % ointment Apply 1 application topically 2 (two) times daily. (Patient not taking: Reported on 12/23/2020) 30 g 0   No current facility-administered medications for this visit.    Family History  Problem Relation Age of Onset  . Parkinsonism Father   . Breast cancer Maternal Grandmother 61  . Breast cancer Sister     Review of Systems  Constitutional: Negative.   Gastrointestinal: Negative.   Genitourinary: Negative.     Exam:   BP (!) 151/79   Pulse 72   Ht 5' 4.25" (1.632 m)   Wt 145 lb (65.8 kg)   LMP 10/02/1997   BMI 24.70 kg/m   Height: 5' 4.25" (163.2 cm)  General appearance: alert, cooperative and appears stated age Breasts: normal appearance, no masses or tenderness Abdomen: soft, non-tender; bowel sounds normal; no masses,  no organomegaly Lymph nodes: Cervical, supraclavicular, and axillary nodes normal.  No abnormal inguinal nodes palpated Neurologic: Grossly normal  Pelvic: External genitalia:  no lesions              Urethra:  normal appearing urethra with no masses, tenderness or lesions              Bartholins and Skenes: normal                 Vagina: normal appearing vagina with normal color and discharge, no lesions              Cervix: no lesions              Pap taken: No. Bimanual Exam:  Uterus:  normal size, contour, position, consistency, mobility, non-tender              Adnexa: normal adnexa and no mass, fullness, tenderness               Rectovaginal: Confirms               Anus:  normal sphincter tone, no lesions  Chaperone, , CMA, was present for exam.  Assessment/Plan: 1. Encntr for gyn exam (general) (routine) w/o abn findings - pap not indicated due to age - MMG 02/24/2020 - colonoscopy 2013.  Follow up 10 years. - BMD as per above - lab work done with Dr. Joylene Draft - vaccines UTD  2. Urinary frequency - Urine Culture - POCT Urinalysis Dipstick  3. Macroscopic hematuria - she is  going to reach back out to Dr. Tresa Moore.  Feel she needs additional evaluation including possible cystoscopy.  4. Vitamin D deficiency - Vitamin D, Ergocalciferol, (DRISDOL) 1.25 MG (50000 UNIT) CAPS capsule; TAKE 1 CAPSULE BY MOUTH EVERY 7 DAYS  Dispense: 12 capsule; Refill: 4  5. Vaginal atrophy - estradiol (ESTRING) 2 MG vaginal ring; Place 2 mg vaginally every 3 (three) months.  Dispense: 1 each; Refill: 4  6. History of hysteroscopy with polyp resection - no recent VB  7. Intramural uterine fibroid

## 2020-12-25 LAB — URINE CULTURE: Culture: 80000 — AB

## 2020-12-27 DIAGNOSIS — D3002 Benign neoplasm of left kidney: Secondary | ICD-10-CM | POA: Diagnosis not present

## 2020-12-27 DIAGNOSIS — R310 Gross hematuria: Secondary | ICD-10-CM | POA: Diagnosis not present

## 2020-12-27 DIAGNOSIS — N200 Calculus of kidney: Secondary | ICD-10-CM | POA: Diagnosis not present

## 2020-12-29 ENCOUNTER — Other Ambulatory Visit: Payer: Self-pay

## 2020-12-29 ENCOUNTER — Other Ambulatory Visit: Payer: Self-pay | Admitting: Urology

## 2020-12-29 ENCOUNTER — Encounter (HOSPITAL_BASED_OUTPATIENT_CLINIC_OR_DEPARTMENT_OTHER): Payer: Self-pay | Admitting: Urology

## 2020-12-29 NOTE — Progress Notes (Signed)
Spoke w/ via phone for pre-op interview---pt Lab needs dos---- I stat               Lab results------see below COVID test ------12-30-2020 900 Arrive at -------163 am 12-31-2020 NPO after MN NO Solid Food.  Clear liquids from MN until---815 am then npo Med rec completed Medications to take morning of surgery ----diltiazem, flecanide- Diabetic medication -----n/a Patient instructed to bring photo id and insurance card day of surgery Patient aware to have Driver (ride ) / caregiver  Spouse kyle cell [phone]   for 24 hours after surgery  Patient Special Instructions -----none Pre-Op special Istructions -----none Patient verbalized understanding of instructions that were given at this phone interview. Patient denies shortness of breath, chest pain, fever, cough at this phone interview.  Anesthesia Review: hx atrial fib, mild mitral valve regurgitation mild mvp pt denies any cardiac S & S or sob at pre op call, does housework and chores and climbs stairs without problems per pt,   PCP: dr Elta Gu[place] [facility] Cardiologist :dr Lilian Coma 03-11-2020 epic Chest x-ray :01-04-2017 epic EKG :03-11-2020 epic Echo :10-12-2014 epic cardiac cath: none Ct cardiac scoring 08-20-2020 Stress test :treadmill 11-2009 per dr Lilian Coma 03-11-2020 epic  Activity level: does housework and chores climbs stairs without problems Sleep Study/ CPAP :n/a Blood Thinner/ Instructions /Last Dose: patient instructed per dr Tresa Moore to stay on eliquis last dose will be pm 12-30-2020 ASA / Instructions/ Last Dose : n/a

## 2020-12-30 ENCOUNTER — Other Ambulatory Visit (HOSPITAL_COMMUNITY)
Admission: RE | Admit: 2020-12-30 | Discharge: 2020-12-30 | Disposition: A | Discharge status: Home / Self Care | Payer: PPO | Source: Ambulatory Visit | Attending: Urology | Admitting: Urology

## 2020-12-30 DIAGNOSIS — Z01812 Encounter for preprocedural laboratory examination: Secondary | ICD-10-CM | POA: Diagnosis not present

## 2020-12-30 DIAGNOSIS — Z20822 Contact with and (suspected) exposure to covid-19: Secondary | ICD-10-CM | POA: Insufficient documentation

## 2020-12-30 LAB — SARS CORONAVIRUS 2 (TAT 6-24 HRS): SARS Coronavirus 2: NEGATIVE

## 2020-12-31 ENCOUNTER — Telehealth (HOSPITAL_BASED_OUTPATIENT_CLINIC_OR_DEPARTMENT_OTHER): Payer: Self-pay | Admitting: Obstetrics & Gynecology

## 2020-12-31 ENCOUNTER — Ambulatory Visit (HOSPITAL_BASED_OUTPATIENT_CLINIC_OR_DEPARTMENT_OTHER): Payer: PPO | Admitting: Anesthesiology

## 2020-12-31 ENCOUNTER — Ambulatory Visit (HOSPITAL_BASED_OUTPATIENT_CLINIC_OR_DEPARTMENT_OTHER)
Admission: RE | Admit: 2020-12-31 | Discharge: 2020-12-31 | Disposition: A | Discharge status: Home / Self Care | Payer: PPO | Attending: Urology | Admitting: Urology

## 2020-12-31 ENCOUNTER — Encounter (HOSPITAL_BASED_OUTPATIENT_CLINIC_OR_DEPARTMENT_OTHER)
Admission: RE | Disposition: A | Discharge status: Home / Self Care | Payer: Self-pay | Source: Home / Self Care | Attending: Urology

## 2020-12-31 ENCOUNTER — Encounter (HOSPITAL_BASED_OUTPATIENT_CLINIC_OR_DEPARTMENT_OTHER): Payer: Self-pay | Admitting: Urology

## 2020-12-31 DIAGNOSIS — N200 Calculus of kidney: Secondary | ICD-10-CM | POA: Diagnosis not present

## 2020-12-31 DIAGNOSIS — D1771 Benign lipomatous neoplasm of kidney: Secondary | ICD-10-CM | POA: Diagnosis not present

## 2020-12-31 DIAGNOSIS — Z820 Family history of epilepsy and other diseases of the nervous system: Secondary | ICD-10-CM | POA: Diagnosis not present

## 2020-12-31 DIAGNOSIS — Z803 Family history of malignant neoplasm of breast: Secondary | ICD-10-CM | POA: Insufficient documentation

## 2020-12-31 DIAGNOSIS — E559 Vitamin D deficiency, unspecified: Secondary | ICD-10-CM | POA: Diagnosis not present

## 2020-12-31 DIAGNOSIS — R310 Gross hematuria: Secondary | ICD-10-CM | POA: Diagnosis not present

## 2020-12-31 DIAGNOSIS — N202 Calculus of kidney with calculus of ureter: Secondary | ICD-10-CM | POA: Diagnosis present

## 2020-12-31 DIAGNOSIS — Z87442 Personal history of urinary calculi: Secondary | ICD-10-CM | POA: Diagnosis not present

## 2020-12-31 DIAGNOSIS — I4891 Unspecified atrial fibrillation: Secondary | ICD-10-CM | POA: Insufficient documentation

## 2020-12-31 DIAGNOSIS — E785 Hyperlipidemia, unspecified: Secondary | ICD-10-CM | POA: Diagnosis not present

## 2020-12-31 HISTORY — DX: Asymptomatic varicose veins of left lower extremity: I83.92

## 2020-12-31 HISTORY — PX: CYSTOSCOPY/URETEROSCOPY/HOLMIUM LASER/STENT PLACEMENT: SHX6546

## 2020-12-31 HISTORY — DX: Personal history of urinary calculi: Z87.442

## 2020-12-31 HISTORY — DX: Hematuria, unspecified: R31.9

## 2020-12-31 LAB — POCT I-STAT, CHEM 8
BUN: 27 mg/dL — ABNORMAL HIGH (ref 8–23)
Calcium, Ion: 1.29 mmol/L (ref 1.15–1.40)
Chloride: 103 mmol/L (ref 98–111)
Creatinine, Ser: 0.8 mg/dL (ref 0.44–1.00)
Glucose, Bld: 97 mg/dL (ref 70–99)
HCT: 45 % (ref 36.0–46.0)
Hemoglobin: 15.3 g/dL — ABNORMAL HIGH (ref 12.0–15.0)
Potassium: 4.7 mmol/L (ref 3.5–5.1)
Sodium: 143 mmol/L (ref 135–145)
TCO2: 28 mmol/L (ref 22–32)

## 2020-12-31 SURGERY — CYSTOSCOPY/URETEROSCOPY/HOLMIUM LASER/STENT PLACEMENT
Anesthesia: General | Site: Ureter | Laterality: Bilateral

## 2020-12-31 MED ORDER — GENTAMICIN SULFATE 40 MG/ML IJ SOLN
5.0000 mg/kg | INTRAVENOUS | Status: AC
  Administered 2020-12-31: 330 mg via INTRAVENOUS
  Filled 2020-12-31: qty 8.25

## 2020-12-31 MED ORDER — FENTANYL CITRATE (PF) 100 MCG/2ML IJ SOLN
INTRAMUSCULAR | Status: DC | PRN
  Administered 2020-12-31: 50 ug via INTRAVENOUS
  Administered 2020-12-31 (×2): 25 ug via INTRAVENOUS

## 2020-12-31 MED ORDER — ACETAMINOPHEN 325 MG PO TABS
ORAL_TABLET | ORAL | Status: DC | PRN
  Administered 2020-12-31: 1000 mg via ORAL

## 2020-12-31 MED ORDER — ONDANSETRON HCL 4 MG/2ML IJ SOLN
INTRAMUSCULAR | Status: DC | PRN
  Administered 2020-12-31: 4 mg via INTRAVENOUS

## 2020-12-31 MED ORDER — LIDOCAINE 2% (20 MG/ML) 5 ML SYRINGE
INTRAMUSCULAR | Status: AC
  Filled 2020-12-31: qty 5

## 2020-12-31 MED ORDER — PHENYLEPHRINE 40 MCG/ML (10ML) SYRINGE FOR IV PUSH (FOR BLOOD PRESSURE SUPPORT)
PREFILLED_SYRINGE | INTRAVENOUS | Status: AC
  Filled 2020-12-31: qty 10

## 2020-12-31 MED ORDER — ONDANSETRON HCL 4 MG/2ML IJ SOLN
INTRAMUSCULAR | Status: AC
  Filled 2020-12-31: qty 2

## 2020-12-31 MED ORDER — TRAMADOL HCL 50 MG PO TABS: 50.0000 mg | ORAL_TABLET | Freq: Four times a day (QID) | ORAL | 0 refills | Status: DC | PRN

## 2020-12-31 MED ORDER — PROPOFOL 10 MG/ML IV BOLUS
INTRAVENOUS | Status: DC | PRN
  Administered 2020-12-31: 130 mg via INTRAVENOUS

## 2020-12-31 MED ORDER — PROPOFOL 10 MG/ML IV BOLUS
INTRAVENOUS | Status: AC
  Filled 2020-12-31: qty 20

## 2020-12-31 MED ORDER — LACTATED RINGERS IV SOLN: INTRAVENOUS | Status: DC

## 2020-12-31 MED ORDER — ACETAMINOPHEN 500 MG PO TABS
ORAL_TABLET | ORAL | Status: AC
  Filled 2020-12-31: qty 2

## 2020-12-31 MED ORDER — AMISULPRIDE (ANTIEMETIC) 5 MG/2ML IV SOLN: 10.0000 mg | Freq: Once | INTRAVENOUS | Status: DC | PRN

## 2020-12-31 MED ORDER — ACETAMINOPHEN 10 MG/ML IV SOLN: 1000.0000 mg | Freq: Once | INTRAVENOUS | Status: DC | PRN

## 2020-12-31 MED ORDER — PHENYLEPHRINE 40 MCG/ML (10ML) SYRINGE FOR IV PUSH (FOR BLOOD PRESSURE SUPPORT)
PREFILLED_SYRINGE | INTRAVENOUS | Status: DC | PRN
  Administered 2020-12-31: 80 ug via INTRAVENOUS

## 2020-12-31 MED ORDER — FENTANYL CITRATE (PF) 100 MCG/2ML IJ SOLN
INTRAMUSCULAR | Status: AC
  Filled 2020-12-31: qty 2

## 2020-12-31 MED ORDER — FLUCONAZOLE IN SODIUM CHLORIDE 200-0.9 MG/100ML-% IV SOLN
200.0000 mg | Freq: Once | INTRAVENOUS | Status: AC
  Administered 2020-12-31: 200 mg via INTRAVENOUS
  Filled 2020-12-31: qty 100

## 2020-12-31 MED ORDER — LIDOCAINE HCL (CARDIAC) PF 100 MG/5ML IV SOSY
PREFILLED_SYRINGE | INTRAVENOUS | Status: DC | PRN
  Administered 2020-12-31: 60 mg via INTRAVENOUS

## 2020-12-31 MED ORDER — CEPHALEXIN 500 MG PO CAPS: 500.0000 mg | ORAL_CAPSULE | Freq: Two times a day (BID) | ORAL | 0 refills | Status: DC

## 2020-12-31 MED ORDER — FLUCONAZOLE 100 MG PO TABS: 100.0000 mg | ORAL_TABLET | Freq: Every day | ORAL | 0 refills | Status: AC

## 2020-12-31 MED ORDER — SODIUM CHLORIDE 0.9 % IR SOLN
Status: DC | PRN
  Administered 2020-12-31: 6000 mL

## 2020-12-31 MED ORDER — DEXAMETHASONE SODIUM PHOSPHATE 10 MG/ML IJ SOLN
INTRAMUSCULAR | Status: AC
  Filled 2020-12-31: qty 1

## 2020-12-31 MED ORDER — KETOROLAC TROMETHAMINE 10 MG PO TABS: 10.0000 mg | ORAL_TABLET | Freq: Three times a day (TID) | ORAL | 0 refills | Status: DC | PRN

## 2020-12-31 MED ORDER — FENTANYL CITRATE (PF) 100 MCG/2ML IJ SOLN: 25.0000 ug | INTRAMUSCULAR | Status: DC | PRN

## 2020-12-31 MED ORDER — IOHEXOL 300 MG/ML  SOLN
INTRAMUSCULAR | Status: DC | PRN
  Administered 2020-12-31: 35 mL via URETHRAL

## 2020-12-31 MED ORDER — DEXAMETHASONE SODIUM PHOSPHATE 10 MG/ML IJ SOLN
INTRAMUSCULAR | Status: DC | PRN
  Administered 2020-12-31: 10 mg via INTRAVENOUS

## 2020-12-31 MED ORDER — ONDANSETRON HCL 4 MG/2ML IJ SOLN: 4.0000 mg | Freq: Once | INTRAMUSCULAR | Status: DC | PRN

## 2020-12-31 SURGICAL SUPPLY — 28 items
BAG DRAIN URO-CYSTO SKYTR STRL (DRAIN) ×2 IMPLANT
BAG DRN UROCATH (DRAIN) ×1
BASKET LASER NITINOL 1.9FR (BASKET) ×2 IMPLANT
BSKT STON RTRVL 120 1.9FR (BASKET) ×1
CATH INTERMIT  6FR 70CM (CATHETERS) IMPLANT
CLOTH BEACON ORANGE TIMEOUT ST (SAFETY) ×2 IMPLANT
FIBER LASER FLEXIVA 365 (UROLOGICAL SUPPLIES) IMPLANT
GLOVE SURG ENC MOIS LTX SZ7.5 (GLOVE) ×2 IMPLANT
GLOVE SURG UNDER POLY LF SZ7 (GLOVE) ×4 IMPLANT
GLOVE SURG UNDER POLY LF SZ7.5 (GLOVE) ×2 IMPLANT
GOWN STRL REUS W/TWL LRG LVL3 (GOWN DISPOSABLE) ×6 IMPLANT
GUIDEWIRE ANG ZIPWIRE 038X150 (WIRE) ×4 IMPLANT
GUIDEWIRE STR DUAL SENSOR (WIRE) ×4 IMPLANT
IV NS 1000ML (IV SOLUTION)
IV NS 1000ML BAXH (IV SOLUTION) IMPLANT
IV NS IRRIG 3000ML ARTHROMATIC (IV SOLUTION) ×4 IMPLANT
KIT TURNOVER CYSTO (KITS) ×2 IMPLANT
MANIFOLD NEPTUNE II (INSTRUMENTS) ×2 IMPLANT
NS IRRIG 500ML POUR BTL (IV SOLUTION) ×2 IMPLANT
PACK CYSTO (CUSTOM PROCEDURE TRAY) ×2 IMPLANT
SHEATH URETERAL 12FRX28CM (UROLOGICAL SUPPLIES) ×2 IMPLANT
STENT POLARIS 5FRX22 (STENTS) ×4 IMPLANT
SYR 10ML LL (SYRINGE) IMPLANT
TRACTIP FLEXIVA PULS ID 200XHI (Laser) IMPLANT
TRACTIP FLEXIVA PULSE ID 200 (Laser)
TUBE CONNECTING 12X1/4 (SUCTIONS) ×2 IMPLANT
TUBE FEEDING 8FR 16IN STR KANG (MISCELLANEOUS) ×2 IMPLANT
TUBING UROLOGY SET (TUBING) ×2 IMPLANT

## 2020-12-31 NOTE — Anesthesia Procedure Notes (Addendum)
Procedure Name: LMA Insertion Date/Time: 12/31/2020 11:27 AM Performed by: Mechele Claude, CRNA Pre-anesthesia Checklist: Patient identified, Emergency Drugs available, Suction available and Patient being monitored Patient Re-evaluated:Patient Re-evaluated prior to induction Oxygen Delivery Method: Circle system utilized Preoxygenation: Pre-oxygenation with 100% oxygen Induction Type: IV induction Ventilation: Mask ventilation without difficulty LMA: LMA inserted LMA Size: 3.0 Number of attempts: 1 Airway Equipment and Method: Bite block Placement Confirmation: positive ETCO2 Tube secured with: Tape Dental Injury: Teeth and Oropharynx as per pre-operative assessment  Comments: Attempted to place 4 LMA. Wouldn't pass. Placed 3 LMA with ease and good seal.

## 2020-12-31 NOTE — Telephone Encounter (Signed)
Called patient and left a message to call the office to schedule her annual appointment for 2023.

## 2020-12-31 NOTE — Anesthesia Preprocedure Evaluation (Signed)
Anesthesia Evaluation  Patient identified by MRN, date of birth, ID band Patient awake    Reviewed: Allergy & Precautions, NPO status , Patient's Chart, lab work & pertinent test results  Airway Mallampati: II  TM Distance: >3 FB Neck ROM: Full    Dental  (+) Teeth Intact   Pulmonary neg pulmonary ROS,    Pulmonary exam normal        Cardiovascular + dysrhythmias (on Eliquis) Atrial Fibrillation + Valvular Problems/Murmurs MVP  Rhythm:Irregular Rate:Normal     Neuro/Psych negative neurological ROS  negative psych ROS   GI/Hepatic negative GI ROS, Neg liver ROS,   Endo/Other  negative endocrine ROS  Renal/GU Right renal stone, hematuria Bladder dysfunction      Musculoskeletal negative musculoskeletal ROS (+)   Abdominal (+)  Abdomen: soft. Bowel sounds: normal.  Peds  Hematology negative hematology ROS (+)   Anesthesia Other Findings   Reproductive/Obstetrics                             Anesthesia Physical Anesthesia Plan  ASA: III  Anesthesia Plan: General   Post-op Pain Management:    Induction: Intravenous  PONV Risk Score and Plan: 3 and Ondansetron, Dexamethasone, Midazolam and Treatment may vary due to age or medical condition  Airway Management Planned: Mask and LMA  Additional Equipment: None  Intra-op Plan:   Post-operative Plan: Extubation in OR  Informed Consent: I have reviewed the patients History and Physical, chart, labs and discussed the procedure including the risks, benefits and alternatives for the proposed anesthesia with the patient or authorized representative who has indicated his/her understanding and acceptance.     Dental advisory given  Plan Discussed with: CRNA  Anesthesia Plan Comments: (Lab Results      Component                Value               Date                      WBC                      5.6                 03/11/2020                 HGB                      15.3 (H)            12/31/2020                HCT                      45.0                12/31/2020                MCV                      89                  03/11/2020                PLT  285                 03/11/2020           Lab Results      Component                Value               Date                      NA                       143                 12/31/2020                K                        4.7                 12/31/2020                CO2                      26                  03/11/2020                GLUCOSE                  97                  12/31/2020                BUN                      27 (H)              12/31/2020                CREATININE               0.80                12/31/2020                CALCIUM                  9.4                 03/11/2020                GFRNONAA                 70                  03/11/2020                GFRAA                    81                  03/11/2020          )        Anesthesia Quick Evaluation

## 2020-12-31 NOTE — Discharge Instructions (Signed)
1 - You may have urinary urgency (bladder spasms) and bloody urine on / off with stent in place. This is normal.  2 - Remove tethered stents on Monday morning at home by pulling strings, then blue-white plastic tubing, and discarding. Office is open Monday if any problems arise. There are TWO stents.   3 - Call MD or go to ER for fever >102, severe pain / nausea / vomiting not relieved by medications, or acute change in medical status.   Post Anesthesia Home Care Instructions  Activity: Get plenty of rest for the remainder of the day. A responsible individual must stay with you for 24 hours following the procedure.  For the next 24 hours, DO NOT: -Drive a car -Paediatric nurse -Drink alcoholic beverages -Take any medication unless instructed by your physician -Make any legal decisions or sign important papers.  Meals: Start with liquid foods such as gelatin or soup. Progress to regular foods as tolerated. Avoid greasy, spicy, heavy foods. If nausea and/or vomiting occur, drink only clear liquids until the nausea and/or vomiting subsides. Call your physician if vomiting continues.  Special Instructions/Symptoms: Your throat may feel dry or sore from the anesthesia or the breathing tube placed in your throat during surgery. If this causes discomfort, gargle with warm salt water. The discomfort should disappear within 24 hours.  [facility] [phone] Post Ureteroscopy With Stent Instructions  Definitions:  Ureter: The duct that transports urine from the kidney to the bladder. Stent:   A plastic hollow tube that is placed into the ureter, from the kidney to the bladder to prevent the ureter from swelling shut.  GENERAL INSTRUCTIONS:  Despite the fact that no skin incisions were used, the area around the ureter and bladder is raw and irritated. The stent is a foreign body which will further irritate the bladder wall. This irritation is manifested by increased  frequency of urination, both day and night, and by an increase in the urge to urinate. In some, the urge to urinate is present almost always. Sometimes the urge is strong enough that you may not be able to stop yourself from urinating. The only real cure is to remove the stent and then give time for the bladder wall to heal which can't be done until the danger of the ureter swelling shut has passed, which varies.  You may see some blood in your urine while the stent is in place and a few days afterwards. Do not be alarmed, even if the urine was clear for a while. Get off your feet and drink lots of fluids until clearing occurs. If you start to pass clots or don't improve, call us.  DIET: You may return to your normal diet immediately. Because of the raw surface of your bladder, alcohol, spicy foods, acid type foods and drinks with caffeine may cause irritation or frequency and should be used in moderation. To keep your urine flowing freely and to avoid constipation, drink plenty of fluids during the day ( 8-10 glasses ). Tip: Avoid cranberry juice because it is very acidic.  ACTIVITY: Your physical activity doesn't need to be restricted. However, if you are very active, you may see some blood in your urine. We suggest that you reduce your activity under these circumstances until the bleeding has stopped.  BOWELS: It is important to keep your bowels regular during the postoperative period. Straining with bowel movements can cause bleeding. A bowel movement every other day is reasonable. Use a mild laxative if  needed, such as Milk of Magnesia 2-3 tablespoons, or 2 Dulcolax tablets. Call if you continue to have problems. If you have been taking narcotics for pain, before, during or after your surgery, you may be constipated. Take a laxative if necessary.   MEDICATION: You should resume your pre-surgery medications unless told not to. In addition you will often be given an antibiotic to prevent  infection. These should be taken as prescribed until the bottles are finished unless you are having an unusual reaction to one of the drugs.  PROBLEMS YOU SHOULD REPORT TO [place]:  Fevers over 100.5 Fahrenheit.  Heavy bleeding, or clots ( See above notes about blood in urine ).  Inability to urinate.  Drug reactions ( hives, rash, nausea, vomiting, diarrhea ).  Severe burning or pain with urination that is not improving.  FOLLOW-UP: You will need a follow-up appointment to monitor your progress. Call for this appointment at the number listed above. Usually the first appointment will be about three to fourteen days after your surgery.

## 2020-12-31 NOTE — H&P (Signed)
[name] is an 80 y.o. female.    Chief Complaint: Pre-Op BILATERAL Ureteroscopic Stone Manipulation  HPI:   1 - Recurrent Nephrolithiasis -  2016 = Lt UVJ stone passed medically. Composition 100% CaOx.  10/2018 - Lt UVJ stone passed medically. Remaining, RLP 7mm (non-obstructing) and bilatearl puncatate sotnes on CT eval gross hematuria.    2 - Left Renal Angiomyolipoma - left 2cm AML incidental on CT 01/2015. Was 36mm 2006. No h/o retroperitoneal bleed or additional lesions.   Recent Surveillance:   11/2020 - CT - stable 2.6cm Lt AML and RLP 4mm non-obstructing stone    PMH sig for AFib/Wliquus (rate controlled), HLD. Her PCP is Crist Infante MD. Her husband is Ikea Demicco, a retired Stage manager in town. Her daughter is a Paediatric nurse in town. She used to be radiation biology professor Office manager.    Today "Brentley" is seen to proceed with BILATERAL ureteroscoyp with goal of completing recurrent hematuria work-up, treating Rt renal stone, and ruling out intraluminal encroachement from left AML. No interval fevers. C19 screen negative. Most recent UCX very scant yeast (non-diabetic, likely contaminant).     Past Medical History:  Diagnosis Date  . Abnormal Pap smear dysplasiai mild  . Atrial fibrillation ([place])   . Cancer ([place]) 2017   melanoma removed from back of left arm sees dermatology q 6 months  . Drug therapy    Flecainide therapy started March, 2011, treadmill March, 2011, no exercise-induced ectopy  . Hematuria   . History of kidney stones   . Hyperlipidemia   . Mitral regurgitation    Mild, echo, October, 2009  . MVP (mitral valve prolapse)    per pt "mild"  . Varicose veins of left lower extremity     Past Surgical History:  Procedure Laterality Date  . COLONOSCOPY  yrs ago  . DILATATION & CURRETTAGE/HYSTEROSCOPY WITH RESECTOCOPE  08/20/2012   Procedure: Bernice;  Surgeon: Lyman Speller, MD;   Location: [facility];  Service: Gynecology;  Laterality: N/A;  . GYNECOLOGIC CRYOSURGERY  1980's  . HYSTEROSCOPY  11/3   polyp resection  . TONSILLECTOMY AND ADENOIDECTOMY  ahe 5 and 1/2  . varicose vein ablation  yrs ago   left    Family History  Problem Relation Age of Onset  . Parkinsonism Father   . Breast cancer Maternal Grandmother 10  . Breast cancer Sister    Social History:  reports that she has never smoked. She has never used smokeless tobacco. She reports current alcohol use. She reports that she does not use drugs.  Allergies: No Known Allergies  No medications prior to admission.    Results for orders placed or performed during the hospital encounter of 12/30/20 (from the past 48 hour(s))  SARS CORONAVIRUS 2 (TAT 6-24 HRS) Nasopharyngeal Nasopharyngeal Swab     Status: None   Collection Time: 12/30/20  9:07 AM   Specimen: Nasopharyngeal Swab  Result Value Ref Range   SARS Coronavirus 2 NEGATIVE NEGATIVE    Comment: (NOTE) SARS-CoV-2 target nucleic acids are NOT DETECTED.  The SARS-CoV-2 RNA is generally detectable in upper and lower respiratory specimens during the acute phase of infection. Negative results do not preclude SARS-CoV-2 infection, do not rule out co-infections with other pathogens, and should not be used as the sole basis for treatment or other patient management decisions. Negative results must be combined with clinical observations, patient history, and epidemiological information. The expected result is Negative.  Fact Sheet  for Patients: SugarRoll.be  Fact Sheet for Healthcare Providers: https://www.woods-mathews.com/  This test is not yet approved or cleared by the [place] FDA and  has been authorized for detection and/or diagnosis of SARS-CoV-2 by FDA under an Emergency Use Authorization (EUA). This EUA will remain  in effect (meaning this test can be used) for the duration of the COVID-19  declaration under Se ction 564(b)(1) of the Act, 21 U.S.C. section 360bbb-3(b)(1), unless the authorization is terminated or revoked sooner.  Performed at [facility], [place] [address] [place], [place] [address]    No results found.  Review of Systems  Constitutional: Negative.   HENT: Negative.   Eyes: Negative.   Respiratory: Negative.   Cardiovascular: Negative.   Gastrointestinal: Negative.   Endocrine: Negative.   Genitourinary: Positive for hematuria.  Musculoskeletal: Negative.   Skin: Negative.   Allergic/Immunologic: Negative.   Neurological: Negative.   Hematological: Negative.   Psychiatric/Behavioral: Negative.   All other systems reviewed and are negative.   Height 5' 4.5" (1.638 m), weight 64.4 kg, last menstrual period 10/02/1997. Physical Exam   Assessment/Plan  Proceed as planned with BILATERAL ureteroscopy with goal of stone free and completion hematuria eval. Risks, benefits, alternatives, expected peri-op course discussed previously and reiterated today.   Alexis Frock, MD 12/31/2020, 5:34 AM

## 2020-12-31 NOTE — Brief Op Note (Signed)
12/31/2020  12:00 PM  PATIENT:  [name]  80 y.o. female  PRE-OPERATIVE DIAGNOSIS:  HEMATURIA, RIGHT RENAL STONE, LEFT ANGIOMYOLIPOMA  POST-OPERATIVE DIAGNOSIS:  HEMATURIA, RIGHT RENAL STONE, LEFT ANGIOMYOLIPOMA  PROCEDURE:  Procedure(s): CYSTOSCOPY/URETEROSCOPY/STENT PLACEMENT/basketing of stone (Bilateral)  SURGEON:  Surgeon(s) and Role:    Alexis Frock, MD - Primary  PHYSICIAN ASSISTANT:   ASSISTANTS: none   ANESTHESIA:   general  EBL:  minimal   BLOOD ADMINISTERED:none  DRAINS: none   LOCAL MEDICATIONS USED:  NONE  SPECIMEN:  Source of Specimen:  bilaeral renal stones  DISPOSITION OF SPECIMEN:  [facility] for compositional analysis  COUNTS:  YES  TOURNIQUET:  * No tourniquets in log *  DICTATION: .Other Dictation: Dictation Number  [id number]  PLAN OF CARE: Discharge to home after PACU  PATIENT DISPOSITION:  PACU - hemodynamically stable.   Delay start of Pharmacological VTE agent (>24hrs) due to surgical blood loss or risk of bleeding: not applicable

## 2020-12-31 NOTE — Transfer of Care (Signed)
Immediate Anesthesia Transfer of Care Note  Patient: [name]  Procedure(s) Performed: Procedure(s) (LRB): CYSTOSCOPY/URETEROSCOPY/STENT PLACEMENT/basketing of stone (Bilateral)  Patient Location: PACU  Anesthesia Type: General  Level of Consciousness: awake, alert  and oriented  Airway & Oxygen Therapy: Patient Spontanous Breathing and Patient connected to nasal cannula oxygen  Post-op Assessment: Report given to PACU RN and Post -op Vital signs reviewed and stable  Post vital signs: Reviewed and stable  Complications: No apparent anesthesia complications Last Vitals:  Vitals Value Taken Time  BP 124/60 12/31/20 1212  Temp    Pulse 58 12/31/20 1214  Resp 13 12/31/20 1214  SpO2 100 % 12/31/20 1214  Vitals shown include unvalidated device data.  Last Pain:  Vitals:   12/31/20 0941  TempSrc: Oral  PainSc: 0-No pain      Patients Stated Pain Goal: 4 (11/94/17 4081)  Complications: No complications documented.

## 2020-12-31 NOTE — Anesthesia Postprocedure Evaluation (Signed)
Anesthesia Post Note  Patient: [name]  Procedure(s) Performed: CYSTOSCOPY/URETEROSCOPY/STENT PLACEMENT/basketing of stone (Bilateral Ureter)     Patient location during evaluation: PACU Anesthesia Type: General Level of consciousness: awake and alert Pain management: pain level controlled Vital Signs Assessment: post-procedure vital signs reviewed and stable Respiratory status: spontaneous breathing, nonlabored ventilation, respiratory function stable and patient connected to nasal cannula oxygen Cardiovascular status: blood pressure returned to baseline and stable Postop Assessment: no apparent nausea or vomiting Anesthetic complications: no   No complications documented.  Last Vitals:  Vitals:   12/31/20 1300 12/31/20 1345  BP: 138/68 (!) 158/74  Pulse: (!) 58 63  Resp: 18 16  Temp:  36.4 C  SpO2: 100% 100%    Last Pain:  Vitals:   12/31/20 1345  TempSrc:   PainSc: 0-No pain                 Belenda Cruise P Dyonna Jaspers

## 2021-01-01 NOTE — Op Note (Signed)
NAME: [name], [name] RECORD NO: [id number] ACCOUNT NO: [id number] DATE OF BIRTH: [birth year] FACILITY: [facility] LOCATION: [facility] PHYSICIAN: Alexis Frock, MD  Operative Report   DATE OF PROCEDURE: 12/31/2020  PREOPERATIVE DIAGNOSES:  History of urolithiasis, small angiomyolipoma, gross hematuria.  PROCEDURE PERFORMED: 1.  Cystoscopy with bilateral retrograde pyelograms, interpretation. 2.  Bilateral ureteroscopy, basketing of stones. 3.  Insertion of bilateral ureteral stents, 5 x 22 Polaris with tether.  ESTIMATED BLOOD LOSS:  Nil.  COMPLICATIONS:  None.  SPECIMEN: Renal stone fragments for composition analysis.  FINDINGS:    1.  No evidence of intraluminal encroachment from small angiomyolipoma. 2.  Bilateral papillary tip calcifications. 3.  Complete resolution of all accessible stone fragments, larger than one-third mm following basket extraction. 4.  Successful placement of bilateral ureteral stents, proximal end in renal pelvis, distal end in urinary bladder.  INDICATIONS:  The patient is a very pleasant and quite vigorous 80 year old woman with a history of recurrent urolithiasis and a small renal angiomyolipoma.  She is on blood thinners at baseline.  She has had some recurrent bouts of gross hematuria that  had been understandably concerning for her.  The imaging evaluation only with small angiomyolipoma which remains far less than 4 cm and bilateral papillary tip calcifications noted.  Options discussed for management including observation versus  endoscopic exam with ureteroscopy with a goal of rendering stone free and ruling out any intraluminal encroachment of the small angiomyolipoma and she wished to proceed with goal of stone free.  Informed consent was obtained, placed in the medical  record.  DESCRIPTION OF PROCEDURE:  The patient being verified, procedure being cystoscopy, bilateral retrograde ureteroscopic stone manipulation was confirmed.  procedure  timeout was performed, intravenous antibiotics were administered.  General LMA anesthesia induced.   The patient was placed into a low lithotomy position.  Sterile field was created, prepped and draped the patient's vagina, introitus and proximal thighs using iodine.  Cystourethroscopy was performed using 21-French rigid scope with offset lens.   Inspection of urinary bladder revealed no diverticula, calcifications, papillary lesions.  Ureteral orifices were singleton bilaterally.  The left ureteral orifice was cannulated with a 6-French renal catheter and a left retrograde pyelogram was  obtained.  Left retrograde pyelogram demonstrated a single left ureter, single system left kidney.  No filling defects or narrowing noted.  A 0.038 ZIPwire was advanced to the level of the upper pole, set aside as a safety wire.  Next, right retrograde pyelogram  was obtained.  Right retrograde pyelogram demonstrated a single right ureter, single system right kidney.  No obvious filling defects or narrowing noted.  No hydronephrosis noted.  A separate 0.038 ZIPwire was advanced to the level of the upper pole, set aside as a  safety wire.  An 8-French feeding tube placed in the urinary bladder, pressure released.  Semirigid ureteroscopy was performed of the distal four-fifths of the right ureter alongside a separate sensor working wire.  No mucosal abnormalities were found.   Next, semirigid ureteroscopy was performed of the distal four-fifths of the left ureter alongside a separate sensor working wire.  No mucosal abnormalities were found.  The semirigid scope was then exchanged for a 12/14 short length ureteral access  sheath to the level of the mid ureter, using continuous fluoroscopic guidance, a flexible digital ureteroscopy was performed of the proximal left ureter and systematic inspection of the left kidney, including all calices x3.  There is no evidence of  intraluminal protrusion of her angiomyolipoma,  whatsoever.  This is quite fortunate.  There was a dominant papillary tip calcification in the upper pole, estimated to be approximately 4-5 mm.  This was amenable to simple basketing.  The Escape basket was  grasped and brought out in its entirety and set aside for composition analysis.  Repeat inspection of all calices x2 revealed no additional calcifications and no neoplasia or overt other sources for hematuria.  Access sheath was removed under continuous  vision.  No mucosal abnormalities were found.  Next, the access sheath was placed over the right sensor working wire to the level of the proximal right ureter and flexible digital ureteroscopy was performed of the proximal right ureter and systematic  inspection of the right kidney including all calices x3.  Similarly, there was only a single foci of dominant papillary tip calcifications in the lower pole.  Interestingly, the dominant right renal calcification was not visualized intraluminally, likely  corresponding to parenchymal stone.  The small papillary tip calcifications in the lower pole were amenable to simple basketing.  These were only approximately 1-2 mm.  These was easily grasped and removed out in entirety and set aside.  FINDINGS:  Complete resolution of all accessible stone fragments larger than one-third mm on the right side.  Access sheath was removed under continuous vision.  No significant mucosal abnormalities were found.  Given the bilateral nature of the  procedure today,  it was felt that brief interval stenting with tether stent will be most prudent.  As such, a 5 x 22 Polaris type stents were placed bilaterally over the remaining safety wires using fluoroscopic guidance.  Good proximal and distal  planes were noted.  Tethers were left in place, tucked to short length and per vagina and the procedure was terminated.  The patient tolerated the procedure well.  No immediate periprocedural complications.  The patient was taken to  postanesthesia care  unit in stable condition.  We will plan for discharge home.   SHW D: 12/31/2020 12:06:19 pm T: 01/01/2021 1:50:00 am  JOB: [id number]/ [id number]

## 2021-01-03 ENCOUNTER — Encounter (HOSPITAL_BASED_OUTPATIENT_CLINIC_OR_DEPARTMENT_OTHER): Payer: Self-pay | Admitting: Urology

## 2021-01-03 DIAGNOSIS — N200 Calculus of kidney: Secondary | ICD-10-CM | POA: Diagnosis not present

## 2021-01-03 DIAGNOSIS — R310 Gross hematuria: Secondary | ICD-10-CM | POA: Diagnosis not present

## 2021-01-05 ENCOUNTER — Encounter (HOSPITAL_BASED_OUTPATIENT_CLINIC_OR_DEPARTMENT_OTHER): Payer: Self-pay

## 2021-01-05 DIAGNOSIS — I340 Nonrheumatic mitral (valve) insufficiency: Secondary | ICD-10-CM | POA: Diagnosis not present

## 2021-01-05 DIAGNOSIS — I480 Paroxysmal atrial fibrillation: Secondary | ICD-10-CM | POA: Diagnosis not present

## 2021-01-05 DIAGNOSIS — D6869 Other thrombophilia: Secondary | ICD-10-CM | POA: Diagnosis not present

## 2021-01-05 DIAGNOSIS — N390 Urinary tract infection, site not specified: Secondary | ICD-10-CM | POA: Diagnosis not present

## 2021-01-05 DIAGNOSIS — R310 Gross hematuria: Secondary | ICD-10-CM | POA: Diagnosis not present

## 2021-01-13 ENCOUNTER — Other Ambulatory Visit: Payer: Self-pay | Admitting: Internal Medicine

## 2021-01-13 DIAGNOSIS — Z1231 Encounter for screening mammogram for malignant neoplasm of breast: Secondary | ICD-10-CM

## 2021-01-14 DIAGNOSIS — Z23 Encounter for immunization: Secondary | ICD-10-CM | POA: Diagnosis not present

## 2021-01-17 DIAGNOSIS — R310 Gross hematuria: Secondary | ICD-10-CM | POA: Diagnosis not present

## 2021-01-17 DIAGNOSIS — N200 Calculus of kidney: Secondary | ICD-10-CM | POA: Diagnosis not present

## 2021-01-17 DIAGNOSIS — D3002 Benign neoplasm of left kidney: Secondary | ICD-10-CM | POA: Diagnosis not present

## 2021-02-02 DIAGNOSIS — R300 Dysuria: Secondary | ICD-10-CM | POA: Diagnosis not present

## 2021-02-10 ENCOUNTER — Other Ambulatory Visit: Payer: Self-pay | Admitting: Cardiovascular Disease

## 2021-02-15 DIAGNOSIS — R310 Gross hematuria: Secondary | ICD-10-CM | POA: Diagnosis not present

## 2021-02-15 DIAGNOSIS — D3002 Benign neoplasm of left kidney: Secondary | ICD-10-CM | POA: Diagnosis not present

## 2021-02-15 DIAGNOSIS — N3000 Acute cystitis without hematuria: Secondary | ICD-10-CM | POA: Diagnosis not present

## 2021-02-15 DIAGNOSIS — N200 Calculus of kidney: Secondary | ICD-10-CM | POA: Diagnosis not present

## 2021-02-27 ENCOUNTER — Other Ambulatory Visit: Payer: Self-pay | Admitting: Cardiovascular Disease

## 2021-03-01 NOTE — Telephone Encounter (Signed)
Pt's age 80, wt 65.5 kg, SCr 0.8, CrCl 59.93, last ov w/ PN 03/11/20- next f/u 03/22/21.

## 2021-03-14 DIAGNOSIS — E785 Hyperlipidemia, unspecified: Secondary | ICD-10-CM | POA: Diagnosis not present

## 2021-03-14 DIAGNOSIS — N3000 Acute cystitis without hematuria: Secondary | ICD-10-CM | POA: Diagnosis not present

## 2021-03-15 ENCOUNTER — Other Ambulatory Visit: Payer: Self-pay

## 2021-03-15 ENCOUNTER — Ambulatory Visit
Admission: RE | Admit: 2021-03-15 | Discharge: 2021-03-15 | Disposition: A | Discharge status: Home / Self Care | Payer: PPO | Source: Ambulatory Visit | Attending: Internal Medicine | Admitting: Internal Medicine

## 2021-03-15 DIAGNOSIS — Z1231 Encounter for screening mammogram for malignant neoplasm of breast: Secondary | ICD-10-CM | POA: Diagnosis not present

## 2021-03-21 ENCOUNTER — Encounter: Payer: Self-pay | Admitting: Cardiovascular Disease

## 2021-03-21 DIAGNOSIS — R311 Benign essential microscopic hematuria: Secondary | ICD-10-CM | POA: Diagnosis not present

## 2021-03-21 DIAGNOSIS — D2261 Melanocytic nevi of right upper limb, including shoulder: Secondary | ICD-10-CM | POA: Diagnosis not present

## 2021-03-21 DIAGNOSIS — N3000 Acute cystitis without hematuria: Secondary | ICD-10-CM | POA: Diagnosis not present

## 2021-03-21 NOTE — Progress Notes (Signed)
Cardiology Office Note   Date:  03/22/2021   ID:  [name], Jenna 1941/02/12, MRN [id number]  PCP:  Crist Infante, MD  Cardiologist:   Mertie Moores, MD   (former [name] patient )   Chief Complaint  Patient presents with   Atrial Fibrillation   Problem List 1. Paroxysmal atrial fib  2. Hyperlipidemia 3. Mitral valve prolapse with mild MR     [name] is a 80 y.o. female who presents to establish care. She has seen Dr. Ron [name] in the past. Has a hx of paroxysmal atrial fib   She has been stable on Flecainide  Oct. 5, 2017:  [name] is seen for follow up of her PAF  Spent the summer in [place]  Has been keeping up with her BP.  January 25, 2017  Seen for follow up of her atrial fib. Presented to the ER with rapid atrial fib.   Received IV Dilt and was cardioverted in the ER .  Was seen in the atrial fib clinic - Flecainide was increased to 100 mg BID  Has felt well since then  We discussed Afib ablation   Mar 01, 2018:  [name] is doing well.  She has maintained normal sinus rhythm.  She is tolerating flecainide 100 mg twice a day. She remains very active and exercises regularly. Watches her salt .  March 11, 2020: [name] is seen today for follow-up of her paroxysmal atrial fibrillation. She has been fairly well controlled  Has been active.  Goes to [employer] regularly  Husband [name] is being treated for pulmonary fibrosis   March 22, 2021: [name] is seen today for follow up of her PAF.  Has a house at [employer].  No CP Had some hematura recently - required ureteral stenting . Reduced eliquis for several days.    Past Medical History:  Diagnosis Date   Abnormal Pap smear dysplasiai mild   Atrial fibrillation ([place])    Cancer ([place]) 2017   melanoma removed from back of left arm sees dermatology q 6 months   Drug therapy    Flecainide therapy started March, 2011, treadmill March, 2011, no exercise-induced ectopy   Hematuria    History  of kidney stones    Hyperlipidemia    Mitral regurgitation    Mild, echo, October, 2009   MVP (mitral valve prolapse)    per pt "mild"   Varicose veins of left lower extremity     Past Surgical History:  Procedure Laterality Date   COLONOSCOPY  yrs ago   CYSTOSCOPY/URETEROSCOPY/HOLMIUM LASER/STENT PLACEMENT Bilateral 12/31/2020   Procedure: CYSTOSCOPY/URETEROSCOPY/STENT PLACEMENT/basketing of stone;  Surgeon: Alexis Frock, MD;  Location: [facility];  Service: Urology;  Laterality: Bilateral;   DILATATION & CURRETTAGE/HYSTEROSCOPY WITH RESECTOCOPE  08/20/2012   Procedure: [place];  Surgeon: Lyman Speller, MD;  Location: [facility];  Service: Gynecology;  Laterality: N/A;   GYNECOLOGIC CRYOSURGERY  1980's   HYSTEROSCOPY  11/3   polyp resection   TONSILLECTOMY AND ADENOIDECTOMY  ahe 5 and 1/2   varicose vein ablation  yrs ago   left     Current Outpatient Medications  Medication Sig Dispense Refill   acetaminophen (TYLENOL) 500 MG tablet Take 500-1,000 mg by mouth every 6 (six) hours as needed for moderate pain or headache.     B Complex Vitamins (VITAMIN B COMPLEX) TABS Take 1 tablet by mouth at bedtime.     cephALEXin (KEFLEX) 500 MG capsule  Take 1 capsule (500 mg total) by mouth 2 (two) times daily. To prevent infection with tethered stents 6 capsule 0   estradiol (ESTRING) 2 MG vaginal ring Place 2 mg vaginally every 3 (three) months. 1 each 4   ketorolac (TORADOL) 10 MG tablet Take 1 tablet (10 mg total) by mouth every 8 (eight) hours as needed for moderate pain. Or stent discomfort post-operatively 20 tablet 0   Magnesium 500 MG TABS Take 1 tablet by mouth every other day. In PM     Multiple Vitamin (MULTIVITAMIN) capsule Take 1 capsule by mouth at bedtime.     Multiple Vitamins-Minerals (ICAPS AREDS 2 PO) Take by mouth 2 (two) times daily.     NON FORMULARY CBD oil - 500mg  q week prn     PREVIDENT 5000 BOOSTER  PLUS 1.1 % PSTE Uses q week  4   rosuvastatin (CRESTOR) 10 MG tablet Take 1 tablet by mouth every evening.  3   traMADol (ULTRAM) 50 MG tablet Take 1 tablet (50 mg total) by mouth every 6 (six) hours as needed for severe pain. Post-operatively 15 tablet 0   Vitamin D, Ergocalciferol, (DRISDOL) 1.25 MG (50000 UNIT) CAPS capsule TAKE 1 CAPSULE BY MOUTH EVERY 7 DAYS (Patient taking differently: TAKE 1 CAPSULE BY MOUTH EVERY 7 DAYS sunday) 12 capsule 4   apixaban (ELIQUIS) 5 MG TABS tablet Take 1 tablet (5 mg total) by mouth 2 (two) times daily. 180 tablet 3   diltiazem (CARDIZEM CD) 120 MG 24 hr capsule TAKE 1 CAPSULE BY MOUTH EVERY DAY 90 capsule 3   flecainide (TAMBOCOR) 100 MG tablet TAKE 1 TABLET BY MOUTH EVERY 12 HOURS. 180 tablet 3   No current facility-administered medications for this visit.    Allergies:   Patient has no known allergies.    Social History:  The patient  reports that she has never smoked. She has never used smokeless tobacco. She reports current alcohol use. She reports that she does not use drugs.   Family History:  The patient's family history includes Breast cancer in her sister; Breast cancer (age of onset: 41) in her maternal grandmother; Parkinsonism in her father.   ROS: Noted in current history, otherwise review of systems is negative.   Physical Exam: Blood pressure 126/86, pulse 69, height 5' 4.5" (1.638 m), weight 143 lb 9.6 oz (65.1 kg), last menstrual period 10/02/1997, SpO2 98 %.  GEN:  Well nourished, well developed in no acute distress HEENT: Normal NECK: No JVD; No carotid bruits LYMPHATICS: No lymphadenopathy CARDIAC: RRR , no murmurs, rubs, gallops RESPIRATORY:  Clear to auscultation without rales, wheezing or rhonchi  ABDOMEN: Soft, non-tender, non-distended MUSCULOSKELETAL:  No edema; No deformity  SKIN: Warm and dry NEUROLOGIC:  Alert and oriented x 3   EKG:    March 22, 2021: Normal sinus rhythm at 69.  No ST or T wave  changes.    Recent Labs: 12/31/2020: BUN 27; Creatinine, Ser 0.80; Hemoglobin 15.3; Potassium 4.7; Sodium 143    Lipid Panel No results found for: CHOL, TRIG, HDL, CHOLHDL, VLDL, LDLCALC, LDLDIRECT    Wt Readings from Last 3 Encounters:  03/22/21 143 lb 9.6 oz (65.1 kg)  12/31/20 144 lb 6.4 oz (65.5 kg)  12/23/20 145 lb (65.8 kg)      Other studies Reviewed: Additional studies/ records that were reviewed today include: . Review of the above records demonstrates:    ASSESSMENT AND PLAN:  1. Paroxysmal atrial fib -    remains  in NSR.  Cont Flecaininde. Potassium is normal   2. Hyperlipidemia-    managed by primary care   3. Mitral valve prolapse with mild MR -   Stable       Current medicines are reviewed at length with the patient today.  The patient does not have concerns regarding medicines.  The following changes have been made:  no change  Labs/ tests ordered today include:   Orders Placed This Encounter  Procedures   EKG 12-Lead      Disposition:   FU in 1 year     Mertie Moores, MD  03/22/2021 8:43 AM    [facility] [address], [place], [place]  [address] Phone: [phone]; Fax: [phone]

## 2021-03-22 ENCOUNTER — Ambulatory Visit: Payer: PPO | Admitting: Cardiovascular Disease

## 2021-03-22 ENCOUNTER — Other Ambulatory Visit: Payer: Self-pay

## 2021-03-22 ENCOUNTER — Encounter: Payer: Self-pay | Admitting: Cardiovascular Disease

## 2021-03-22 VITALS — BP 126/86 | HR 69 | Ht 64.5 in | Wt 143.6 lb

## 2021-03-22 DIAGNOSIS — I480 Paroxysmal atrial fibrillation: Secondary | ICD-10-CM

## 2021-03-22 MED ORDER — FLECAINIDE ACETATE 100 MG PO TABS: ORAL_TABLET | ORAL | 3 refills | Status: DC

## 2021-03-22 MED ORDER — DILTIAZEM HCL ER COATED BEADS 120 MG PO CP24: ORAL_CAPSULE | ORAL | 3 refills | Status: DC

## 2021-03-22 MED ORDER — ELIQUIS 5 MG PO TABS: 1.0000 | ORAL_TABLET | Freq: Two times a day (BID) | ORAL | 3 refills | Status: DC

## 2021-03-22 NOTE — Patient Instructions (Signed)
Medication Instructions:  Your physician recommends that you continue on your current medications as directed. Please refer to the Current Medication list given to you today.  *If you need a refill on your cardiac medications before your next appointment, please call your pharmacy*   Lab Work: none   Testing/Procedures: none   Follow-Up: At [facility], you and your health needs are our priority.  As part of our continuing mission to provide you with exceptional heart care, we have created designated Provider Care Teams.  These Care Teams include your primary Cardiologist (physician) and Advanced Practice Providers (APPs -  Physician Assistants and Nurse Practitioners) who all work together to provide you with the care you need, when you need it.  We recommend signing up for the patient portal called "MyChart".  Sign up information is provided on this After Visit Summary.  MyChart is used to connect with patients for Virtual Visits (Telemedicine).  Patients are able to view lab/test results, encounter notes, upcoming appointments, etc.  Non-urgent messages can be sent to your provider as well.   To learn more about what you can do with MyChart, go to [email or web address].    Your next appointment:   1 year(s)  The format for your next appointment:   In Person  Provider:   Mertie Moores, MD

## 2021-04-26 DIAGNOSIS — N3020 Other chronic cystitis without hematuria: Secondary | ICD-10-CM | POA: Diagnosis not present

## 2021-05-04 DIAGNOSIS — H35373 Puckering of macula, bilateral: Secondary | ICD-10-CM | POA: Diagnosis not present

## 2021-05-04 DIAGNOSIS — H353132 Nonexudative age-related macular degeneration, bilateral, intermediate dry stage: Secondary | ICD-10-CM | POA: Diagnosis not present

## 2021-05-04 DIAGNOSIS — H2513 Age-related nuclear cataract, bilateral: Secondary | ICD-10-CM | POA: Diagnosis not present

## 2021-05-04 DIAGNOSIS — H52203 Unspecified astigmatism, bilateral: Secondary | ICD-10-CM | POA: Diagnosis not present

## 2021-07-01 DIAGNOSIS — R310 Gross hematuria: Secondary | ICD-10-CM | POA: Diagnosis not present

## 2021-07-01 DIAGNOSIS — N200 Calculus of kidney: Secondary | ICD-10-CM | POA: Diagnosis not present

## 2021-07-01 DIAGNOSIS — D1771 Benign lipomatous neoplasm of kidney: Secondary | ICD-10-CM | POA: Diagnosis not present

## 2021-07-01 DIAGNOSIS — D252 Subserosal leiomyoma of uterus: Secondary | ICD-10-CM | POA: Diagnosis not present

## 2021-07-02 DIAGNOSIS — Z23 Encounter for immunization: Secondary | ICD-10-CM | POA: Diagnosis not present

## 2021-07-15 DIAGNOSIS — K611 Rectal abscess: Secondary | ICD-10-CM | POA: Diagnosis not present

## 2021-07-18 DIAGNOSIS — K610 Anal abscess: Secondary | ICD-10-CM | POA: Diagnosis not present

## 2021-08-29 DIAGNOSIS — N3020 Other chronic cystitis without hematuria: Secondary | ICD-10-CM | POA: Diagnosis not present

## 2021-08-29 DIAGNOSIS — D3002 Benign neoplasm of left kidney: Secondary | ICD-10-CM | POA: Diagnosis not present

## 2021-08-29 DIAGNOSIS — R310 Gross hematuria: Secondary | ICD-10-CM | POA: Diagnosis not present

## 2021-08-29 DIAGNOSIS — N200 Calculus of kidney: Secondary | ICD-10-CM | POA: Diagnosis not present

## 2021-09-09 DIAGNOSIS — E559 Vitamin D deficiency, unspecified: Secondary | ICD-10-CM | POA: Diagnosis not present

## 2021-09-09 DIAGNOSIS — I700 Atherosclerosis of aorta: Secondary | ICD-10-CM | POA: Diagnosis not present

## 2021-09-09 DIAGNOSIS — E785 Hyperlipidemia, unspecified: Secondary | ICD-10-CM | POA: Diagnosis not present

## 2021-09-09 DIAGNOSIS — R7301 Impaired fasting glucose: Secondary | ICD-10-CM | POA: Diagnosis not present

## 2021-09-13 DIAGNOSIS — Z1212 Encounter for screening for malignant neoplasm of rectum: Secondary | ICD-10-CM | POA: Diagnosis not present

## 2021-09-16 DIAGNOSIS — D6869 Other thrombophilia: Secondary | ICD-10-CM | POA: Diagnosis not present

## 2021-09-16 DIAGNOSIS — Z1339 Encounter for screening examination for other mental health and behavioral disorders: Secondary | ICD-10-CM | POA: Diagnosis not present

## 2021-09-16 DIAGNOSIS — R3121 Asymptomatic microscopic hematuria: Secondary | ICD-10-CM | POA: Diagnosis not present

## 2021-09-16 DIAGNOSIS — Z0000 Encounter for general adult medical examination without abnormal findings: Secondary | ICD-10-CM | POA: Diagnosis not present

## 2021-09-16 DIAGNOSIS — R82998 Other abnormal findings in urine: Secondary | ICD-10-CM | POA: Diagnosis not present

## 2021-09-16 DIAGNOSIS — M8580 Other specified disorders of bone density and structure, unspecified site: Secondary | ICD-10-CM | POA: Diagnosis not present

## 2021-09-16 DIAGNOSIS — I340 Nonrheumatic mitral (valve) insufficiency: Secondary | ICD-10-CM | POA: Diagnosis not present

## 2021-09-16 DIAGNOSIS — I700 Atherosclerosis of aorta: Secondary | ICD-10-CM | POA: Diagnosis not present

## 2021-09-16 DIAGNOSIS — N200 Calculus of kidney: Secondary | ICD-10-CM | POA: Diagnosis not present

## 2021-09-16 DIAGNOSIS — K921 Melena: Secondary | ICD-10-CM | POA: Diagnosis not present

## 2021-09-16 DIAGNOSIS — I480 Paroxysmal atrial fibrillation: Secondary | ICD-10-CM | POA: Diagnosis not present

## 2021-09-16 DIAGNOSIS — Z1331 Encounter for screening for depression: Secondary | ICD-10-CM | POA: Diagnosis not present

## 2021-09-16 DIAGNOSIS — R7301 Impaired fasting glucose: Secondary | ICD-10-CM | POA: Diagnosis not present

## 2021-09-20 ENCOUNTER — Telehealth: Payer: Self-pay | Admitting: Internal Medicine

## 2021-09-20 ENCOUNTER — Encounter: Payer: Self-pay | Admitting: Internal Medicine

## 2021-09-20 NOTE — Telephone Encounter (Signed)
Done

## 2021-09-20 NOTE — Telephone Encounter (Signed)
Absolutely. Her husband is Dr. Helmut Muster.  Very nice gentleman.  I did not receive his text message.  However, please call him and let him know that I have received your message.   For Hemoccult positive stool, I would like to see her in the office in January as you have scheduled.  We will certainly plan on doing her colonoscopy sooner than November 2023.  We will arrange things during her upcoming office visit.  We will see if she needs any additional assessment or evaluation.  Wish them a Angela Nevin Christmas from me.  Thanks so much. Dr. Henrene Pastor

## 2021-09-20 NOTE — Telephone Encounter (Signed)
Hi Dr. Henrene Pastor,   We received a referral for this patient to be seen for positive hemo test last colonoscopy done with Dr. Sharlett Iles. Has recall for 11/23 and the patients husband who is your patient asked that I send you a message to ask if her recall can be done sooner. I scheduled her for an office visit with you on 10/20/21.   Patients husband also stated he sent you a text via cell phone not sure if you received it.   Thanks

## 2021-10-20 ENCOUNTER — Encounter: Payer: Self-pay | Admitting: Internal Medicine

## 2021-10-20 ENCOUNTER — Ambulatory Visit: Payer: PPO | Admitting: Internal Medicine

## 2021-10-20 VITALS — BP 128/66 | HR 76 | Ht 64.5 in | Wt 149.0 lb

## 2021-10-20 DIAGNOSIS — K5520 Angiodysplasia of colon without hemorrhage: Secondary | ICD-10-CM

## 2021-10-20 DIAGNOSIS — R195 Other fecal abnormalities: Secondary | ICD-10-CM | POA: Diagnosis not present

## 2021-10-20 DIAGNOSIS — Z7901 Long term (current) use of anticoagulants: Secondary | ICD-10-CM | POA: Diagnosis not present

## 2021-10-20 MED ORDER — SUTAB 1479-225-188 MG PO TABS: 1.0000 | ORAL_TABLET | Freq: Once | ORAL | 0 refills | Status: AC

## 2021-10-20 NOTE — Patient Instructions (Signed)
If you are age 80 or older, your body mass index should be between 23-30. Your Body mass index is 25.18 kg/m. If this is out of the aforementioned range listed, please consider follow up with your Primary Care Provider.  If you are age 64 or younger, your body mass index should be between 19-25. Your Body mass index is 25.18 kg/m. If this is out of the aformentioned range listed, please consider follow up with your Primary Care Provider.   ________________________________________________________  The [facility] providers would like to encourage you to use [facility] to communicate with providers for non-urgent requests or questions.  Due to long hold times on the telephone, sending your provider a message by [facility] may be a faster and more efficient way to get a response.  Please allow 48 business hours for a response.  Please remember that this is for non-urgent requests.  _______________________________________________________  [name] have been scheduled for an endoscopy and colonoscopy. Please follow the written instructions given to you at your visit today. Please pick up your prep supplies at the pharmacy within the next 1-3 days. If you use inhalers (even only as needed), please bring them with you on the day of your procedure.

## 2021-10-20 NOTE — Progress Notes (Signed)
HISTORY OF PRESENT ILLNESS:  [name] is a 81 y.o. female, wife of Dr. Helmut Muster, who sent today by her primary care provider regarding Hemoccult positive stool.  The patient has a history of atrial fibrillation for which she is on chronic Eliquis therapy.  I have reviewed outside records.  As part of the patient's annual evaluation she underwent blood work September 09, 2021.  Comprehensive metabolic panel and CBC were normal.  Hemoglobin 13.5.  Routine Hemoccult testing performed September 13, 2021 returned positive.  She did have a history of perianal abscess treated in October 2022.  CT imaging from 2016 of the abdomen and pelvis without contrast revealed hydronephrosis secondary to obstructing calculus.  Her last complete colonoscopy was performed with Dr. Verl Blalock November 2013.  She was found to have nonbleeding cecal AVM.  No other abnormalities.  Follow-up in 10 years recommended.  Careful review of the patient's GI review of systems is entirely negative.  No melena, hematochezia, heartburn, abdominal pain, change in bowel habits, or unexplained weight loss.  REVIEW OF SYSTEMS:  All non-GI ROS negative unless otherwise stated in the HPI except for hematuria  Past Medical History:  Diagnosis Date   Abnormal Pap smear dysplasiai mild   Aortic atherosclerosis ([facility])    Atrial fibrillation ([place])    Cancer ([place]) 2017   melanoma removed from back of left arm sees dermatology q 6 months   Drug therapy    Flecainide therapy started March, 2011, treadmill March, 2011, no exercise-induced ectopy   Hematuria    History of kidney stones    Hyperlipidemia    Melanoma ([place])    left arm   Mitral regurgitation    Mild, echo, October, 2009   MVP (mitral valve prolapse)    per pt "mild"   Perirectal abscess    Varicose veins of left lower extremity     Past Surgical History:  Procedure Laterality Date   COLONOSCOPY  yrs ago   CYSTOSCOPY/URETEROSCOPY/HOLMIUM LASER/STENT PLACEMENT  Bilateral 12/31/2020   Procedure: CYSTOSCOPY/URETEROSCOPY/STENT PLACEMENT/basketing of stone;  Surgeon: Alexis Frock, MD;  Location: [facility];  Service: Urology;  Laterality: Bilateral;   DILATATION & CURRETTAGE/HYSTEROSCOPY WITH RESECTOCOPE  08/20/2012   Procedure: [place];  Surgeon: Lyman Speller, MD;  Location: [facility];  Service: Gynecology;  Laterality: N/A;   GYNECOLOGIC CRYOSURGERY  1980's   HYSTEROSCOPY  11/3   polyp resection   TONSILLECTOMY AND ADENOIDECTOMY  ahe 5 and 1/2   varicose vein ablation  yrs ago   left    Social History [name]  reports that she has never smoked. She has never used smokeless tobacco. She reports current alcohol use. She reports that she does not use drugs.  family history includes Breast cancer in her sister; Breast cancer (age of onset: 38) in her maternal grandmother; Parkinsonism in her father.  No Known Allergies     PHYSICAL EXAMINATION: Vital signs: BP 128/66    Pulse 76    Ht 5' 4.5" (1.638 m)    Wt 149 lb (67.6 kg)    LMP 10/02/1997    BMI 25.18 kg/m   Constitutional: generally well-appearing, no acute distress Psychiatric: alert and oriented x3, cooperative Eyes: extraocular movements intact, anicteric, conjunctiva pink Mouth: oral pharynx moist, no lesions Neck: supple no lymphadenopathy Cardiovascular: heart regular rate and rhythm, no murmur Lungs: clear to auscultation bilaterally Abdomen: soft, nontender, nondistended, no obvious ascites, no peritoneal signs, normal bowel sounds, no  organomegaly Rectal: Deferred until colonoscopy Extremities: no clubbing, cyanosis, or lower extremity edema bilaterally Skin: no lesions on visible extremities Neuro: No focal deficits.  Cranial nerves intact  ASSESSMENT:  1.  Heme positive stool.  Asymptomatic.  Normal hemoglobin. 2.  Chronic anticoagulation for atrial fibrillation 3.  Colonoscopy November 2013  revealed cecal AVM.  This could cause heme positive stool.   PLAN:  1.  Colonoscopy to evaluate Hemoccult-positive stool.  The patient is high risk given her chronic anticoagulation status.The nature of the procedure, as well as the risks, benefits, and alternatives were carefully and thoroughly reviewed with the patient. Ample time for discussion and questions allowed. The patient understood, was satisfied, and agreed to proceed.  2.  Upper endoscopy to evaluate Hemoccult-positive stool in somebody on chronic anticoagulation therapy and no PPI.  High risk as above.The nature of the procedure, as well as the risks, benefits, and alternatives were carefully and thoroughly reviewed with the patient. Ample time for discussion and questions allowed. The patient understood, was satisfied, and agreed to proceed.  3.  Hold Eliquis the day prior to the procedure and the day of the procedure.  Anticipate resumption immediately post procedure pending procedure results.  The pros and cons of interruption of anticoagulation therapy reviewed.  She is in favor, particular given bleeding issues with urologic intervention on anticoagulation. 4.  May benefit from empiric PPI therapy.  We discussed the

## 2021-11-02 DIAGNOSIS — H353132 Nonexudative age-related macular degeneration, bilateral, intermediate dry stage: Secondary | ICD-10-CM | POA: Diagnosis not present

## 2021-11-02 DIAGNOSIS — H35373 Puckering of macula, bilateral: Secondary | ICD-10-CM | POA: Diagnosis not present

## 2021-11-02 DIAGNOSIS — H2513 Age-related nuclear cataract, bilateral: Secondary | ICD-10-CM | POA: Diagnosis not present

## 2021-11-14 DIAGNOSIS — K921 Melena: Secondary | ICD-10-CM | POA: Diagnosis not present

## 2021-11-16 DIAGNOSIS — K921 Melena: Secondary | ICD-10-CM | POA: Diagnosis not present

## 2021-12-16 ENCOUNTER — Encounter: Payer: PPO | Admitting: Internal Medicine

## 2022-01-02 ENCOUNTER — Ambulatory Visit (INDEPENDENT_AMBULATORY_CARE_PROVIDER_SITE_OTHER): Payer: PPO | Admitting: Obstetrics & Gynecology

## 2022-01-02 ENCOUNTER — Encounter (HOSPITAL_BASED_OUTPATIENT_CLINIC_OR_DEPARTMENT_OTHER): Payer: Self-pay | Admitting: Obstetrics & Gynecology

## 2022-01-02 ENCOUNTER — Other Ambulatory Visit (HOSPITAL_COMMUNITY)
Admission: RE | Admit: 2022-01-02 | Discharge: 2022-01-02 | Disposition: A | Discharge status: Home / Self Care | Payer: PPO | Source: Ambulatory Visit | Attending: Obstetrics & Gynecology | Admitting: Obstetrics & Gynecology

## 2022-01-02 VITALS — BP 144/73 | HR 64 | Ht 64.0 in | Wt 142.6 lb

## 2022-01-02 DIAGNOSIS — Z01419 Encounter for gynecological examination (general) (routine) without abnormal findings: Secondary | ICD-10-CM | POA: Diagnosis not present

## 2022-01-02 DIAGNOSIS — I480 Paroxysmal atrial fibrillation: Secondary | ICD-10-CM | POA: Diagnosis not present

## 2022-01-02 DIAGNOSIS — N200 Calculus of kidney: Secondary | ICD-10-CM | POA: Diagnosis not present

## 2022-01-02 DIAGNOSIS — N952 Postmenopausal atrophic vaginitis: Secondary | ICD-10-CM | POA: Diagnosis not present

## 2022-01-02 DIAGNOSIS — Z124 Encounter for screening for malignant neoplasm of cervix: Secondary | ICD-10-CM | POA: Diagnosis not present

## 2022-01-02 DIAGNOSIS — E559 Vitamin D deficiency, unspecified: Secondary | ICD-10-CM | POA: Diagnosis not present

## 2022-01-02 MED ORDER — ESTRING 2 MG VA RING: 2.0000 mg | VAGINAL_RING | VAGINAL | 4 refills | Status: DC

## 2022-01-02 MED ORDER — VITAMIN D (ERGOCALCIFEROL) 1.25 MG (50000 UNIT) PO CAPS: 50000.0000 [IU] | ORAL_CAPSULE | ORAL | 4 refills | Status: DC

## 2022-01-02 NOTE — Progress Notes (Signed)
81 y.o. G63P2002 [name] here for breast and pelvic exam.  Denies vaginal bleeding. ? ?Denies vaginal bleeding.  H/o hematuria.  Had evaluation with Dr. Tresa Moore last year.  Has significant post operative bleeding.  Bleeding was from eliquis and a stone.  Bleeding in her urine stopped after about a month. ? ?Husband has lymphoma and is almost done with chemotherapy.  They are moving to [place].   ? ?Son got married last year.  They have a 41 month old.  Wife would really like to be back in [place].   ? ?Patient's last menstrual period was 10/02/1997.          ?Sexually active: No.  ?H/O STD:  no ? ?Health Maintenance: ?PCP:  Dr. Crist Infante.  Last wellness appt was last year.  Did blood work at that appt: yes ?Vaccines are up to date:  yes ?Colonoscopy:  08/05/2012, follow up 10 years ?MMG:  03/15/2021 Negative ?BMD:  10/13/2013 ?Last pap smear:  06/07/2018 Negative.   ?H/o abnormal pap smear:  remote hx of cryo ? ? ? reports that she has never smoked. She has never used smokeless tobacco. She reports current alcohol use. She reports that she does not use drugs. ? ?Past Medical History:  ?Diagnosis Date  ? Abnormal Pap smear dysplasiai mild  ? Aortic atherosclerosis ([place])   ? Atrial fibrillation ([place])   ? Cancer [facility]) 2017  ? melanoma removed from back of left arm sees dermatology q 6 months  ? Drug therapy   ? Flecainide therapy started March, 2011, treadmill March, 2011, no exercise-induced ectopy  ? Hematuria   ? History of kidney stones   ? Hyperlipidemia   ? Melanoma ([place])   ? left arm  ? Mitral regurgitation   ? Mild, echo, October, 2009  ? MVP (mitral valve prolapse)   ? per pt "mild"  ? Perirectal abscess   ? Varicose veins of left lower extremity   ? ? ?Past Surgical History:  ?Procedure Laterality Date  ? COLONOSCOPY  yrs ago  ? CYSTOSCOPY/URETEROSCOPY/HOLMIUM LASER/STENT PLACEMENT Bilateral 12/31/2020  ? Procedure: CYSTOSCOPY/URETEROSCOPY/STENT PLACEMENT/basketing of stone;   Surgeon: Alexis Frock, MD;  Location: [facility];  Service: Urology;  Laterality: Bilateral;  ? DILATATION & CURRETTAGE/HYSTEROSCOPY WITH RESECTOCOPE  08/20/2012  ? Procedure: [place];  Surgeon: Lyman Speller, MD;  Location: [facility];  Service: Gynecology;  Laterality: N/A;  ? GYNECOLOGIC CRYOSURGERY  1980's  ? HYSTEROSCOPY  11/3  ? polyp resection  ? TONSILLECTOMY AND ADENOIDECTOMY  ahe 5 and 1/2  ? varicose vein ablation  yrs ago  ? left  ? ? ?Current Outpatient Medications  ?Medication Sig Dispense Refill  ? acetaminophen (TYLENOL) 500 MG tablet Take 500-1,000 mg by mouth every 6 (six) hours as needed for moderate pain or headache.    ? apixaban (ELIQUIS) 5 MG TABS tablet Take 1 tablet (5 mg total) by mouth 2 (two) times daily. 180 tablet 3  ? B Complex Vitamins (VITAMIN B COMPLEX) TABS Take 1 tablet by mouth at bedtime.    ? diltiazem (CARDIZEM CD) 120 MG 24 hr capsule TAKE 1 CAPSULE BY MOUTH EVERY DAY 90 capsule 3  ? flecainide (TAMBOCOR) 100 MG tablet TAKE 1 TABLET BY MOUTH EVERY 12 HOURS. 180 tablet 3  ? Magnesium 500 MG TABS Take 1 tablet by mouth every other day. In PM    ? Multiple Vitamin (MULTIVITAMIN) capsule Take 1 capsule by mouth at bedtime.    ?  Multiple Vitamins-Minerals (ICAPS AREDS 2 PO) Take by mouth 2 (two) times daily.    ? PREVIDENT 5000 BOOSTER PLUS 1.1 % PSTE Uses q week  4  ? rosuvastatin (CRESTOR) 10 MG tablet Take 1 tablet by mouth every evening.  3  ? Vitamin D, Ergocalciferol, (DRISDOL) 1.25 MG (50000 UNIT) CAPS capsule TAKE 1 CAPSULE BY MOUTH EVERY 7 DAYS (Patient taking differently: TAKE 1 CAPSULE BY MOUTH EVERY 7 DAYS sunday) 12 capsule 4  ? estradiol (ESTRING) 2 MG vaginal ring Place 2 mg vaginally every 3 (three) months. (Patient not taking: Reported on 01/02/2022) 1 each 4  ? ?No current facility-administered medications for this visit.  ? ? ?Family History  ?Problem Relation Age of Onset  ? Parkinsonism Father   ?  Breast cancer Maternal Grandmother 63  ? Breast cancer Sister   ? ? ?Review of Systems  ?Constitutional: Negative.   ? ?Exam:   ?BP (!) 144/73 (BP Location: Right Arm, Patient Position: Sitting, Cuff Size: Normal)   Pulse 64   Ht '5\' 4"'$  (1.626 m) Comment: reported  Wt 142 lb 9.6 oz (64.7 kg)   LMP 10/02/1997   BMI 24.48 kg/m?   Height: '5\' 4"'$  (162.6 cm) (reported) ? ?General appearance: alert, cooperative and appears stated age ?Breasts: normal appearance, no masses or tenderness ?Abdomen: soft, non-tender; bowel sounds normal; no masses,  no organomegaly ?Lymph nodes: Cervical, supraclavicular, and axillary nodes normal.  No abnormal inguinal nodes palpated ?Neurologic: Grossly normal ? ?Pelvic: External genitalia:  no lesions ?             Urethra:  normal appearing urethra with no masses, tenderness or lesions ?             Bartholins and Skenes: normal    ?             Vagina: normal appearing vagina with atrophic changes and no discharge, no lesions ?             Cervix: no lesions ?             Pap taken: No. ?Bimanual Exam:  Uterus:  normal size, contour, position, consistency, mobility, non-tender ?             Adnexa: normal adnexa and no mass, fullness, tenderness ?              Rectovaginal: Confirms ?              Anus:  normal sphincter tone, no lesions ? ?Chaperone, Octaviano Batty, CMA, was present for exam. ? ?Assessment/Plan: ?1. Encntr for gyn exam (general) (routine) w/o abn findings ?- pap smear obtained today.  Recent study regarding cervical cancer in women >65 discussed ?- MMG 03/2021 ?- colonoscopy 08/2012.  Pt has scheduled due to blood in stool.   ?- BMD done with Dr. Joylene Draft in the past.  I do not have recent update. ?- vaccines reviewed/updated ?- lab work done with Dr. Joylene Draft ? ?2. Cervical cancer screening ?- Cytology - PAP( [place]) ?- PR OBTAINING SCREEN PAP SMEAR ? ?3. Vaginal atrophy ?- estradiol (ESTRING) 2 MG vaginal ring; Place 2 mg vaginally every 3 (three) months.  Dispense:  1 each; Refill: 4 ? ?4. Paroxysmal atrial fibrillation (HCC) ?- on eliquis.  Followed by Dr. Cathie Olden ? ?5. Renal stones ?- followed by Dr. Tresa Moore ? ?6. Vitamin D deficiency ?- Vit D level obtained by Dr. Joylene Draft per pt ?- Vitamin D, Ergocalciferol, (DRISDOL) 1.25 MG (50000  UNIT) CAPS capsule; Take 1 capsule (50,000 Units total) by mouth every 7 (seven) days.  Dispense: 12 capsule; Refill: 4  ? ? ? ?

## 2022-01-04 LAB — CYTOLOGY - PAP: Diagnosis: NEGATIVE

## 2022-02-04 IMAGING — MG DIGITAL SCREENING BILAT W/ TOMO W/ CAD
7 series · 8 of 23 positions shown · non-contrast
Comparison: Previous exam(s).

CLINICAL DATA: Screening.

EXAM:
DIGITAL SCREENING BILATERAL MAMMOGRAM WITH TOMO AND CAD

[R MLO synth-2D]
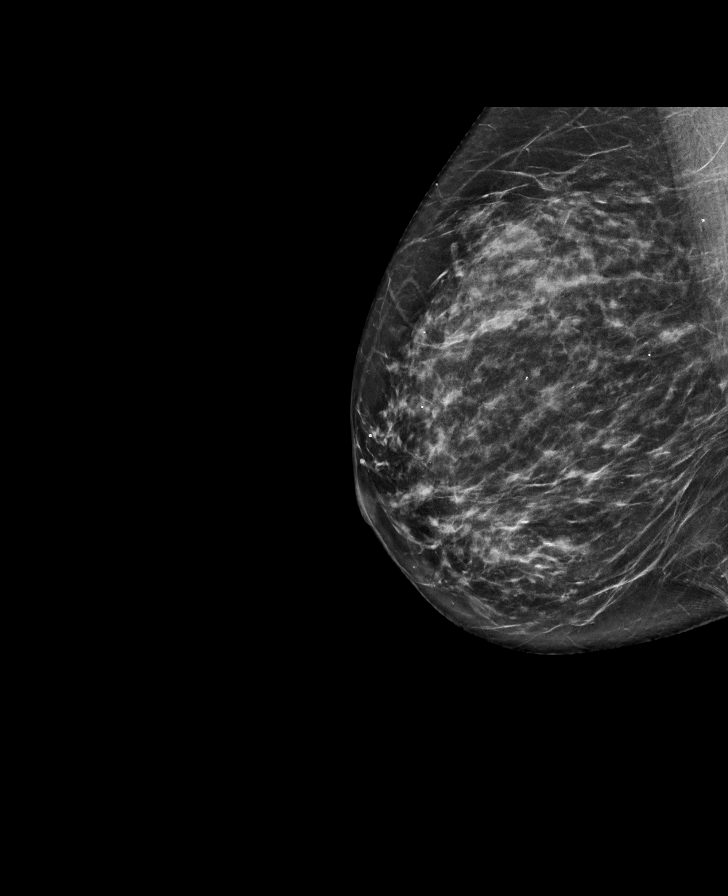

[L CC synth-2D]
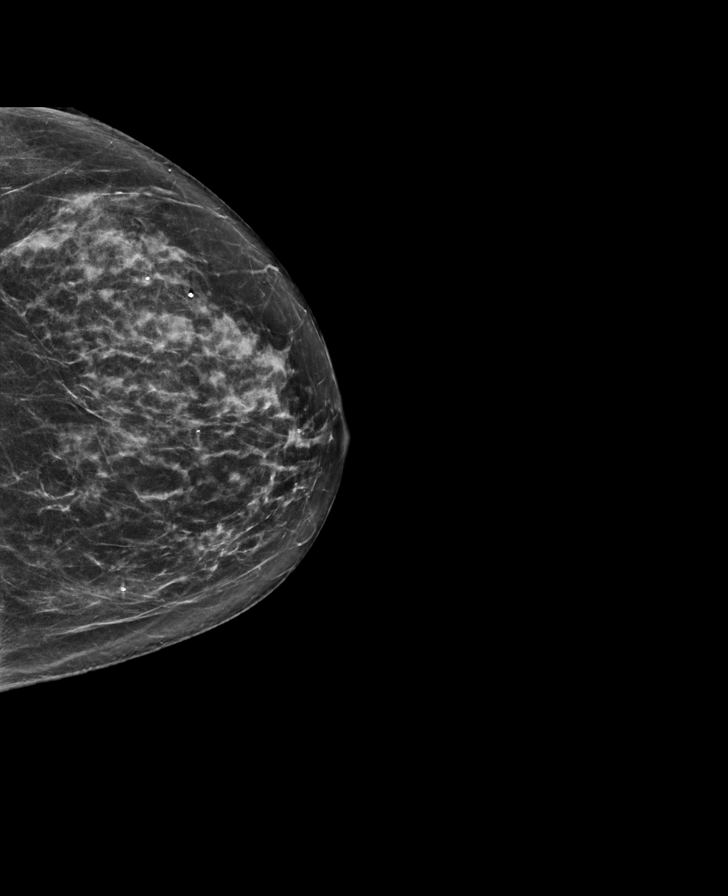

[R CC synth-2D]
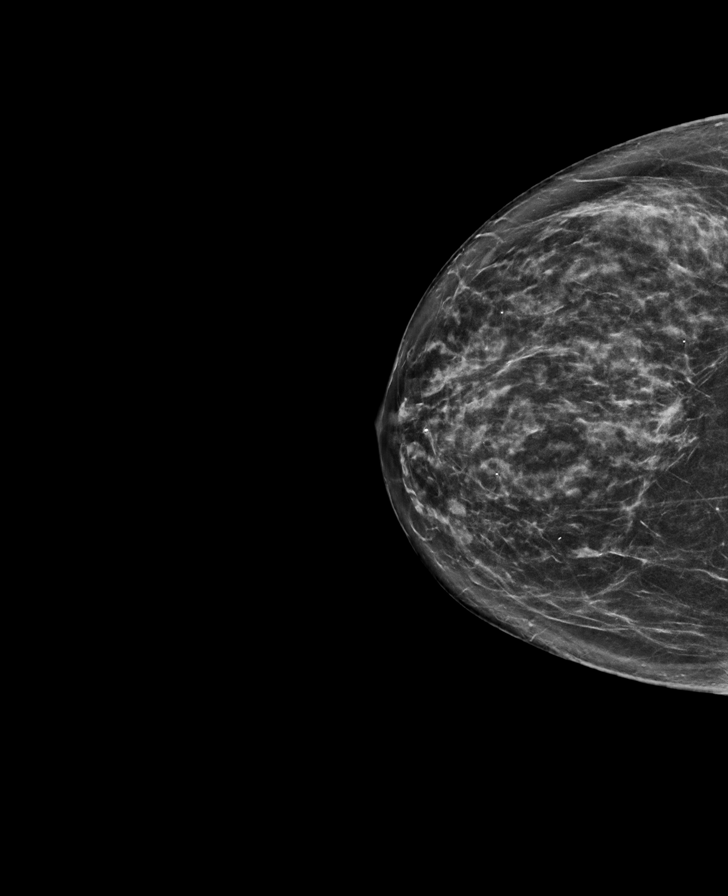

[L MLO tomo · 2 of 64 frames shown]
[frame 21/64]
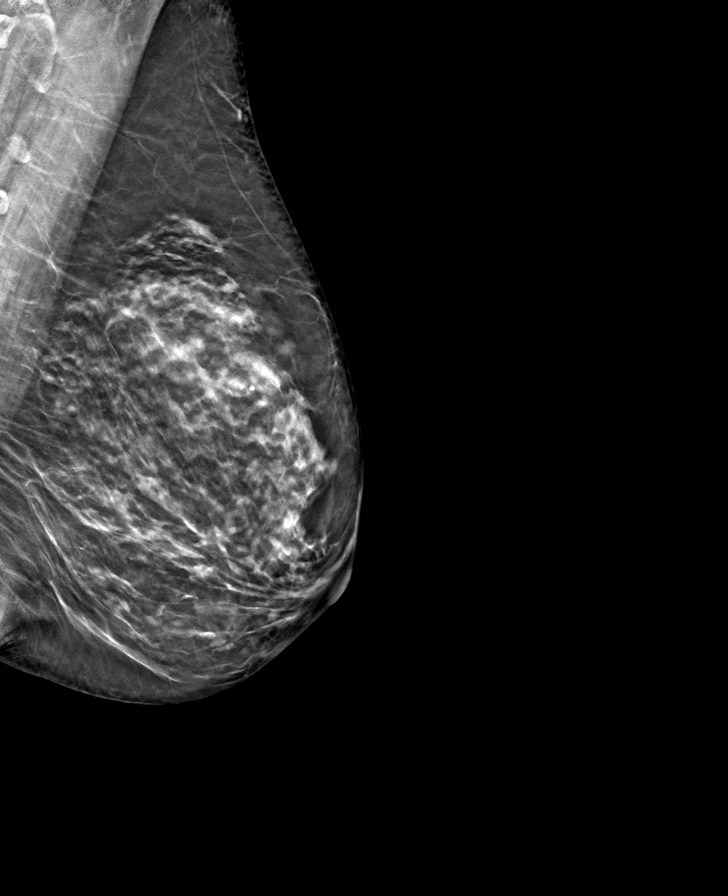
[frame 33/64]
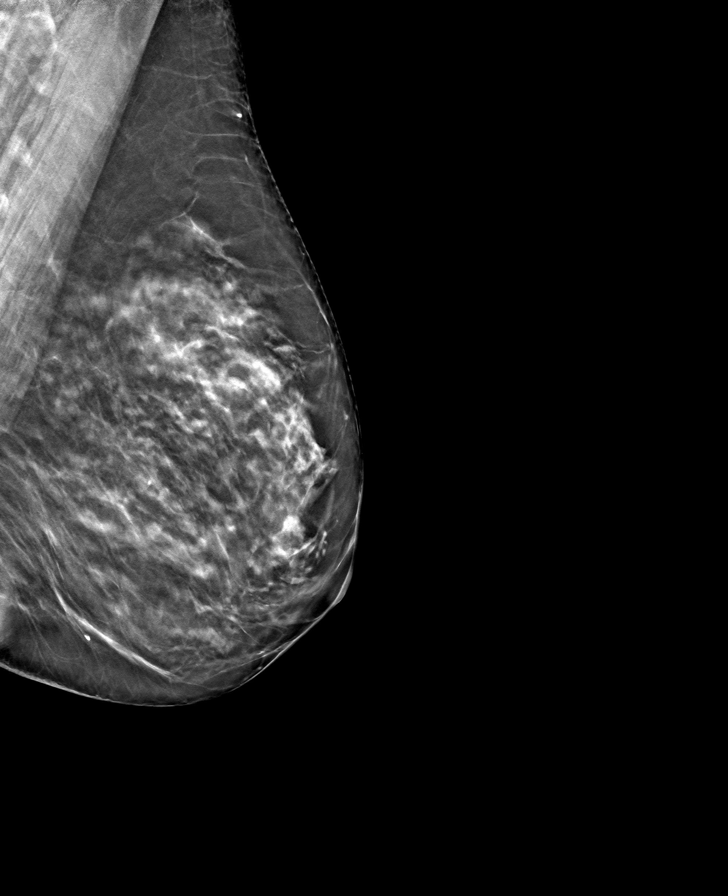

[L CC tomo · tomo slice 33/66.0]
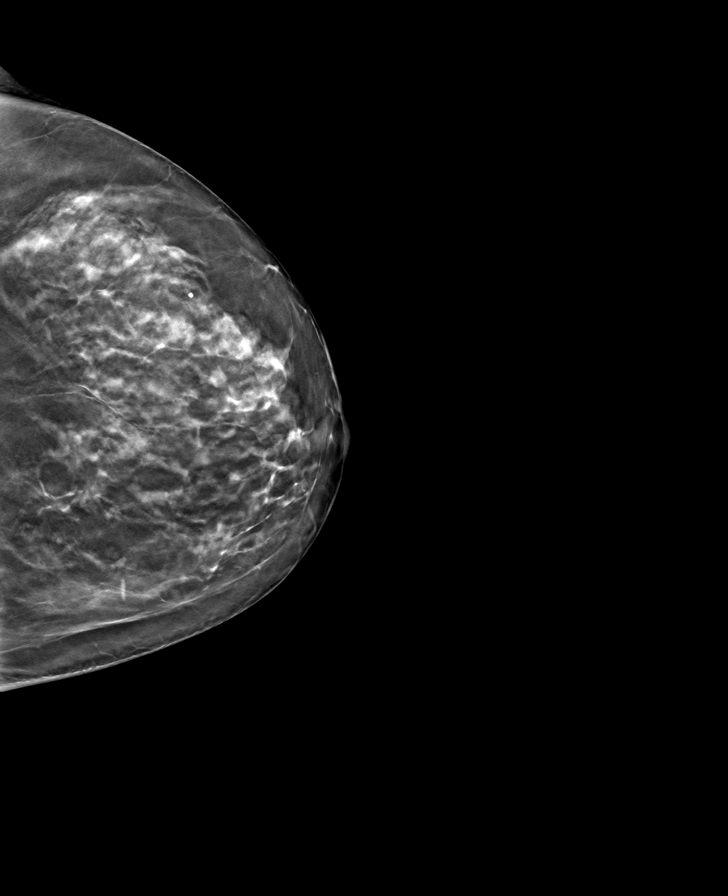

[R MLO tomo · tomo slice 34/67.0]
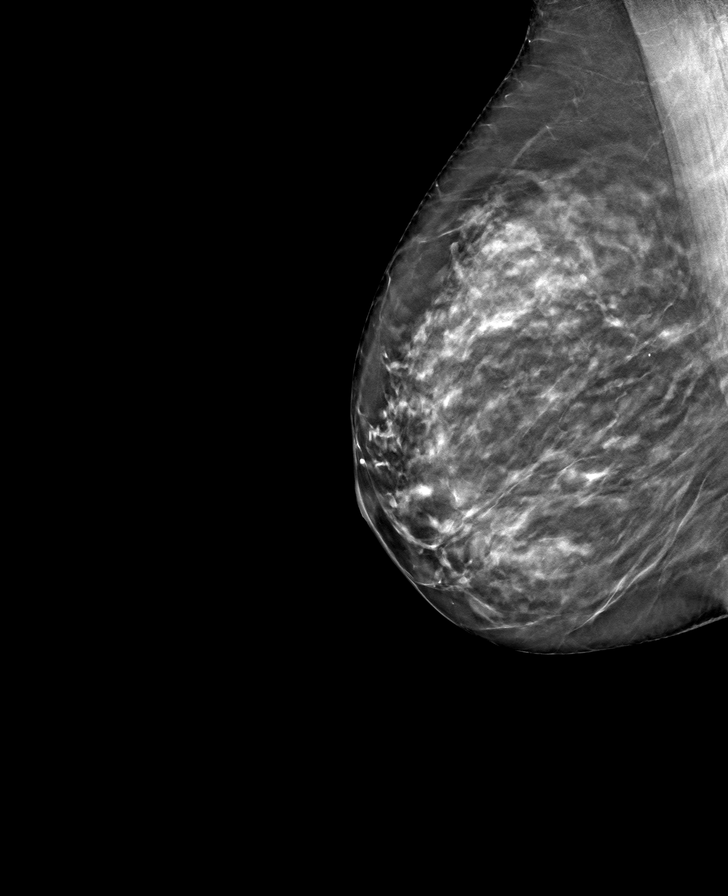

[R CC tomo · tomo slice 35/68.0]
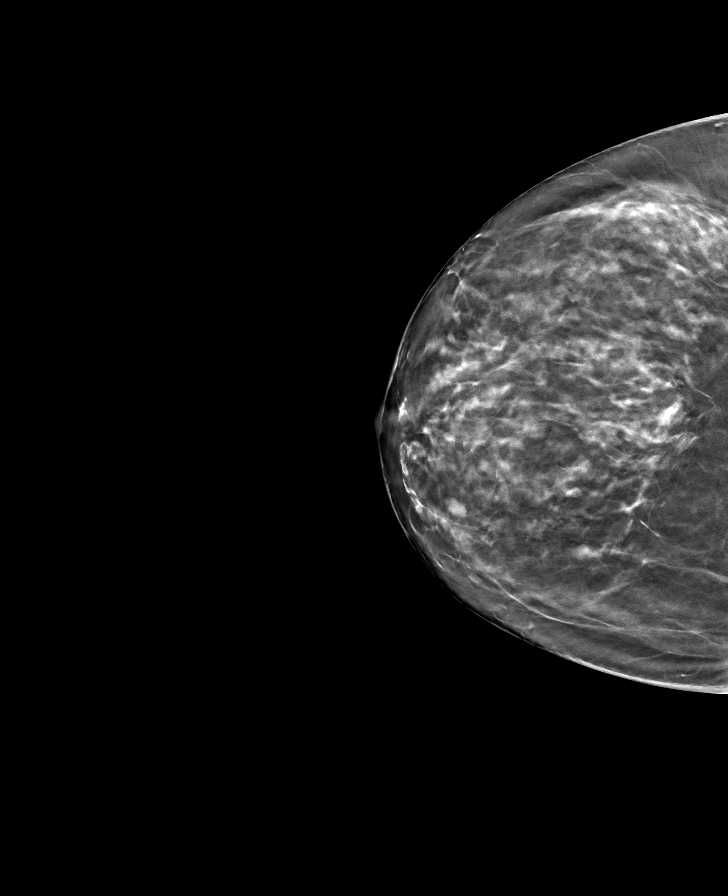

[8 of 23 positions shown; findings below may reference images not displayed]

ACR Breast Density Category c: The breast tissue is heterogeneously
dense, which may obscure small masses.
FINDINGS: There are no findings suspicious for malignancy. Images were
processed with CAD.
IMPRESSION: No mammographic evidence of malignancy. A result letter of this
screening mammogram will be mailed directly to the patient.

RECOMMENDATION:
Screening mammogram in one year. (Code:FT-U-LHB)

BI-RADS CATEGORY  1: Negative.

## 2022-02-20 ENCOUNTER — Other Ambulatory Visit: Payer: Self-pay | Admitting: Internal Medicine

## 2022-02-20 DIAGNOSIS — Z1231 Encounter for screening mammogram for malignant neoplasm of breast: Secondary | ICD-10-CM

## 2022-03-02 ENCOUNTER — Encounter: Payer: Self-pay | Admitting: Internal Medicine

## 2022-03-09 ENCOUNTER — Encounter: Payer: Self-pay | Admitting: Internal Medicine

## 2022-03-09 ENCOUNTER — Ambulatory Visit (AMBULATORY_SURGERY_CENTER): Payer: PPO | Admitting: Internal Medicine

## 2022-03-09 VITALS — BP 117/66 | HR 68 | Temp 97.7°F | Resp 13 | Ht 64.0 in | Wt 149.0 lb

## 2022-03-09 DIAGNOSIS — K2990 Gastroduodenitis, unspecified, without bleeding: Secondary | ICD-10-CM | POA: Diagnosis not present

## 2022-03-09 DIAGNOSIS — K5520 Angiodysplasia of colon without hemorrhage: Secondary | ICD-10-CM | POA: Diagnosis not present

## 2022-03-09 DIAGNOSIS — D126 Benign neoplasm of colon, unspecified: Secondary | ICD-10-CM

## 2022-03-09 DIAGNOSIS — K449 Diaphragmatic hernia without obstruction or gangrene: Secondary | ICD-10-CM | POA: Diagnosis not present

## 2022-03-09 DIAGNOSIS — K209 Esophagitis, unspecified without bleeding: Secondary | ICD-10-CM

## 2022-03-09 DIAGNOSIS — D120 Benign neoplasm of cecum: Secondary | ICD-10-CM

## 2022-03-09 DIAGNOSIS — K2970 Gastritis, unspecified, without bleeding: Secondary | ICD-10-CM

## 2022-03-09 DIAGNOSIS — K635 Polyp of colon: Secondary | ICD-10-CM | POA: Diagnosis not present

## 2022-03-09 DIAGNOSIS — Z7901 Long term (current) use of anticoagulants: Secondary | ICD-10-CM | POA: Diagnosis not present

## 2022-03-09 DIAGNOSIS — R195 Other fecal abnormalities: Secondary | ICD-10-CM

## 2022-03-09 MED ORDER — OMEPRAZOLE 40 MG PO CPDR: 40.0000 mg | DELAYED_RELEASE_CAPSULE | Freq: Every day | ORAL | 11 refills | Status: DC

## 2022-03-09 MED ORDER — SODIUM CHLORIDE 0.9 % IV SOLN: 500.0000 mL | Freq: Once | INTRAVENOUS | Status: DC

## 2022-03-09 NOTE — Progress Notes (Signed)
Pt's states no medical or surgical changes since previsit or office visit. 

## 2022-03-09 NOTE — Op Note (Signed)
[facility] Patient Name: [name] Procedure Date: 03/09/2022 9:59 AM MRN: [id number] Endoscopist: Docia Chuck. Henrene Pastor , MD Age: 81 Referring MD:  Date of Birth: 07-26-1941 Gender: Female Account #: [id number] Procedure:                Colonoscopy with EMR polypectomy x 1; snare                            polypectomy x 1 Indications:              Heme positive stool. Previous colonoscopy 2013 (Dr.                            Sharlett Iles) revealed cecal AVM but was otherwise                            normal. Medicines:                Monitored Anesthesia Care Procedure:                Pre-Anesthesia Assessment:                           - Prior to the procedure, a History and Physical                            was performed, and patient medications and                            allergies were reviewed. The patient's tolerance of                            previous anesthesia was also reviewed. The risks                            and benefits of the procedure and the sedation                            options and risks were discussed with the patient.                            All questions were answered, and informed consent                            was obtained. Prior Anticoagulants: The patient has                            taken Eliquis (apixaban), last dose was 2 days                            prior to procedure. ASA Grade Assessment: III - A                            patient with severe systemic disease. After  reviewing the risks and benefits, the patient was                            deemed in satisfactory condition to undergo the                            procedure.                           After obtaining informed consent, the colonoscope                            was passed under direct vision. Throughout the                            procedure, the patient's blood pressure, pulse, and                            oxygen  saturations were monitored continuously. The                            [id number]-HQ190L Colonoscope was introduced through the                            anus and advanced to the the cecum, identified by                            appendiceal orifice and ileocecal valve. The                            ileocecal valve, appendiceal orifice, and rectum                            were photographed. The quality of the bowel                            preparation was excellent. The colonoscopy was                            performed without difficulty. The patient tolerated                            the procedure well. The bowel preparation used was                            SUPREP via split dose instruction. Scope In: 10:06:57 AM Scope Out: 10:27:02 AM Scope Withdrawal Time: 0 hours 16 minutes 55 seconds  Total Procedure Duration: 0 hours 20 minutes 5 seconds  Findings:                 A 15 mm polyp was found in the ileocecal valve. The                            polyp was semi-pedunculated. The polyp was removed  with endoscopic mucosal resection (EMR technique)                            with a saline injection-lift technique followed by                            polypectomy using a hot snare. Resection and                            retrieval were complete.                           A 4 mm polyp was found in the cecum. The polyp was                            sessile. The polyp was removed with a cold snare.                            Resection and retrieval were complete.                           A single small angiodysplastic lesion without                            bleeding was found in the cecum.                           Multiple diverticula were found in the sigmoid                            colon.                           Internal hemorrhoids were found during                            retroflexion. The hemorrhoids were small.                            The exam was otherwise without abnormality on                            direct and retroflexion views. Complications:            No immediate complications. Estimated blood loss:                            None. Estimated Blood Loss:     Estimated blood loss: none. Impression:               - One 15 mm polyp at the ileocecal valve, removed                            via EMR technique. Resected and retrieved.                           -  One 4 mm polyp in the cecum, removed with a cold                            snare. Resected and retrieved.                           - A single non-bleeding colonic angiodysplastic                            lesion.                           - Diverticulosis in the sigmoid colon.                           - Internal hemorrhoids.                           - The examination was otherwise normal on direct                            and retroflexion views. Recommendation:           - Repeat colonoscopy is not recommended for                            surveillance.                           - Resume Eliquis (apixaban) in 1 week at prior dose.                           - Patient has a contact number available for                            emergencies. The signs and symptoms of potential                            delayed complications were discussed with the                            patient. Return to normal activities tomorrow.                            Written discharge instructions were provided to the                            patient.                           - Resume previous diet.                           - Continue present medications.                           - Await pathology results. Docia Chuck. Henrene Pastor, MD 03/09/2022  10:50:49 AM This report has been signed electronically.

## 2022-03-09 NOTE — Progress Notes (Signed)
Called to room to assist during endoscopic procedure.  Patient ID and intended procedure confirmed with present staff. Received instructions for my participation in the procedure from the performing physician.  

## 2022-03-09 NOTE — Op Note (Signed)
[name]: [name] Procedure Date: 03/09/2022 9:58 AM MRN: [id number] Endoscopist: Docia Chuck. Henrene [name] , MD Age: 81 Referring MD:  Date of Birth: October 24, 1940 Gender: Female Account #: [id number] Procedure:                Upper GI endoscopy with biopsies Indications:              Heme positive stool Medicines:                Monitored Anesthesia Care Procedure:                Pre-Anesthesia Assessment:                           - Prior to the procedure, a History and Physical                            was performed, and patient medications and                            allergies were reviewed. The patient's tolerance of                            previous anesthesia was also reviewed. The risks                            and benefits of the procedure and the sedation                            options and risks were discussed with the patient.                            All questions were answered, and informed consent                            was obtained. Prior Anticoagulants: The patient has                            taken Eliquis (apixaban), last dose was 2 days                            prior to procedure. ASA Grade Assessment: III - A                            patient with severe systemic disease. After                            reviewing the risks and benefits, the patient was                            deemed in satisfactory condition to undergo the                            procedure.  After obtaining informed consent, the endoscope was                            passed under direct vision. Throughout the                            procedure, the patient's blood pressure, pulse, and                            oxygen saturations were monitored continuously. The                            GIF HQ190 #[id number] was introduced through the                            mouth, and advanced to the second part of duodenum.                             The upper GI endoscopy was accomplished without                            difficulty. The patient tolerated the procedure                            well. Scope In: Scope Out: Findings:                 The esophagus revealed esophagitis with edema and                            friability as well as a large caliber peptic                            stricture.                           The stomach revealed a small hiatal hernia as well                            as gastric antral erosions. Biopsies were taken                            with a cold forceps for histology.                           The examined duodenum was normal.                           The cardia and gastric fundus were normal on                            retroflexion. Complications:            No immediate complications. Estimated Blood Loss:     Estimated blood loss: none. Impression:  1. Esophagitis and esophageal stricture                           2. Gastric erosions. Recommendation:           1. Patient has a contact number available for                            emergencies. The signs and symptoms of potential                            delayed complications were discussed with the                            patient. Return to normal activities tomorrow.                            Written discharge instructions were provided to the                            patient.                           2. Resume previous diet.                           3. Continue present medications.                           4. Await pathology results.                           5. Resume Eliquis in 1 week. See colonoscopy report                           6. Prescribe omeprazole 40 mg daily; #30; 11                            refills. This medication will heal the inflammation                            of your esophagus and stomach. As well, will                            prevent the risk of developing  ulcers. This is                            important as you are on a anticoagulant chronically Jenna Maddix N. Henrene Pastor, MD 03/09/2022 10:54:59 AM This report has been signed electronically.

## 2022-03-09 NOTE — Patient Instructions (Addendum)
RESUME ELIQUIS IN ONE WEEK AT PRIOR DOSE   HANDOUTS ON POLYS,DIVERTICULOSIS,HEMORRHOIDS ,& GASTRITIS & HIATAL HERNIA  ESOPHAGITIS  GIVEN TO YOU   AWAIT PATHOLOGY RESULTS ON POLYPS REMOVED    AWAIT PATHOLOGY RESULTS ON BIOPSIES OF STOMACH    OMEPRAZOLE 40 MG DAILY #30 WITH 11 REFILLS SENT TO YOUR PHARMACY FOR PICK UP - THIS WILL HEAL INFLAMMATION OF YOUR ESOPHAGUS AND STOMACH         YOU HAD AN ENDOSCOPIC PROCEDURE TODAY AT [place]:   Refer to the procedure report that was given to you for any specific questions about what was found during the examination.  If the procedure report does not answer your questions, please call your gastroenterologist to clarify.  If you requested that your care partner not be given the details of your procedure findings, then the procedure report has been included in a sealed envelope for you to review at your convenience later.  YOU SHOULD EXPECT: Some feelings of bloating in the abdomen. Passage of more gas than usual.  Walking can help get rid of the air that was put into your GI tract during the procedure and reduce the bloating. If you had a lower endoscopy (such as a colonoscopy or flexible sigmoidoscopy) you may notice spotting of blood in your stool or on the toilet paper. If you underwent a bowel prep for your procedure, you may not have a normal bowel movement for a few days.  Please Note:  You might notice some irritation and congestion in your nose or some drainage.  This is from the oxygen used during your procedure.  There is no need for concern and it should clear up in a day or so.  SYMPTOMS TO REPORT IMMEDIATELY:  Following lower endoscopy (colonoscopy or flexible sigmoidoscopy):  Excessive amounts of blood in the stool  Significant tenderness or worsening of abdominal pains  Swelling of the abdomen that is new, acute  Fever of 100F or higher  Following upper endoscopy (EGD)  Vomiting of blood or coffee ground  material  New chest pain or pain under the shoulder blades  Painful or persistently difficult swallowing  New shortness of breath  Fever of 100F or higher  Black, tarry-looking stools  For urgent or emergent issues, a gastroenterologist can be reached at any hour by calling [phone]. Do not use MyChart messaging for urgent concerns.    DIET:  We do recommend a small meal at first, but then you may proceed to your regular diet.  Drink plenty of fluids but you should avoid alcoholic beverages for 24 hours.  ACTIVITY:  You should plan to take it easy for the rest of today and you should NOT DRIVE or use heavy machinery until tomorrow (because of the sedation medicines used during the test).    FOLLOW UP: Our staff will call the number listed on your records 24-72 hours following your procedure to check on you and address any questions or concerns that you may have regarding the information given to you following your procedure. If we do not reach you, we will leave a message.  We will attempt to reach you two times.  During this call, we will ask if you have developed any symptoms of COVID 19. If you develop any symptoms (ie: fever, flu-like symptoms, shortness of breath, cough etc.) before then, please call [phone].  If you test positive for Covid 19 in the 2 weeks post procedure, please call and report this  information to [place].    If any biopsies were taken you will be contacted by phone or by letter within the next 1-3 weeks.  Please call us at [phone] if you have not heard about the biopsies in 3 weeks.    SIGNATURES/CONFIDENTIALITY: You and/or your care partner have signed paperwork which will be entered into your electronic medical record.  These signatures attest to the fact that that the information above on your After Visit Summary has been reviewed and is understood.  Full responsibility of the confidentiality of this discharge information lies with you and/or your  care-partner.

## 2022-03-09 NOTE — Progress Notes (Signed)
Pt in recovery with monitors in place, VSS. Report given to receiving RN. Bite guard was placed with pt awake to ensure comfort. No dental or soft tissue damage noted. 

## 2022-03-09 NOTE — Progress Notes (Signed)
HISTORY OF PRESENT ILLNESS:   [name] is a 81 y.o. female, wife of Dr. Helmut Muster, who sent today by her primary care provider regarding Hemoccult positive stool.  The patient has a history of atrial fibrillation for which she is on chronic Eliquis therapy.  I have reviewed outside records.  As part of the patient's annual evaluation she underwent blood work September 09, 2021.  Comprehensive metabolic panel and CBC were normal.  Hemoglobin 13.5.  Routine Hemoccult testing performed September 13, 2021 returned positive.  She did have a history of perianal abscess treated in October 2022.  CT imaging from 2016 of the abdomen and pelvis without contrast revealed hydronephrosis secondary to obstructing calculus.  Her last complete colonoscopy was performed with Dr. Verl Blalock November 2013.  She was found to have nonbleeding cecal AVM.  No other abnormalities.  Follow-up in 10 years recommended.  Careful review of the patient's GI review of systems is entirely negative.  No melena, hematochezia, heartburn, abdominal pain, change in bowel habits, or unexplained weight loss.   REVIEW OF SYSTEMS:   All non-GI ROS negative unless otherwise stated in the HPI except for hematuria       Past Medical History:  Diagnosis Date   Abnormal Pap smear dysplasiai mild   Aortic atherosclerosis (H[facility])     Atrial fibrillation ([place])     Cancer ([place]) 2017    melanoma removed from back of left arm sees dermatology q 6 months   Drug therapy      Flecainide therapy started March, 2011, treadmill March, 2011, no exercise-induced ectopy   Hematuria     History of kidney stones     Hyperlipidemia     Melanoma ([place])      left arm   Mitral regurgitation      Mild, echo, October, 2009   MVP (mitral valve prolapse)      per pt "mild"   Perirectal abscess     Varicose veins of left lower extremity             Past Surgical History:  Procedure Laterality Date   COLONOSCOPY   yrs ago    CYSTOSCOPY/URETEROSCOPY/HOLMIUM LASER/STENT PLACEMENT Bilateral 12/31/2020    Procedure: CYSTOSCOPY/URETEROSCOPY/STENT PLACEMENT/basketing of stone;  Surgeon: Alexis Frock, MD;  Location: [facility];  Service: Urology;  Laterality: Bilateral;   DILATATION & CURRETTAGE/HYSTEROSCOPY WITH RESECTOCOPE   08/20/2012    Procedure: [place];  Surgeon: Lyman Speller, MD;  Location: [facility];  Service: Gynecology;  Laterality: N/A;   GYNECOLOGIC CRYOSURGERY   1980's   HYSTEROSCOPY   11/3    polyp resection   TONSILLECTOMY AND ADENOIDECTOMY   ahe 5 and 1/2   varicose vein ablation   yrs ago    left      Social History NIAOMI CARTAYA  reports that she has never smoked. She has never used smokeless tobacco. She reports current alcohol use. She reports that she does not use drugs.   family history includes Breast cancer in her sister; Breast cancer (age of onset: 71) in her maternal grandmother; Parkinsonism in her father.   No Known Allergies       PHYSICAL EXAMINATION: Vital signs: BP 128/66   Pulse 76   Ht 5' 4.5" (1.638 m)   Wt 149 lb (67.6 kg)   LMP 10/02/1997   BMI 25.18 kg/m   Constitutional: generally well-appearing, no acute distress Psychiatric: alert and oriented x3, cooperative Eyes: extraocular  movements intact, anicteric, conjunctiva pink Mouth: oral pharynx moist, no lesions Neck: supple no lymphadenopathy Cardiovascular: heart regular rate and rhythm, no murmur Lungs: clear to auscultation bilaterally Abdomen: soft, nontender, nondistended, no obvious ascites, no peritoneal signs, normal bowel sounds, no organomegaly Rectal: Deferred until colonoscopy Extremities: no clubbing, cyanosis, or lower extremity edema bilaterally Skin: no lesions on visible extremities Neuro: No focal deficits.  Cranial nerves intact   ASSESSMENT:   1.  Heme positive stool.  Asymptomatic.  Normal hemoglobin. 2.  Chronic  anticoagulation for atrial fibrillation 3.  Colonoscopy November 2013 revealed cecal AVM.  This could cause heme positive stool.     PLAN:   1.  Colonoscopy to evaluate Hemoccult-positive stool.  The patient is high risk given her chronic anticoagulation status.The nature of the procedure, as well as the risks, benefits, and alternatives were carefully and thoroughly reviewed with the patient. Ample time for discussion and questions allowed. The patient understood, was satisfied, and agreed to proceed.  2.  Upper endoscopy to evaluate Hemoccult-positive stool in somebody on chronic anticoagulation therapy and no PPI.  High risk as above.The nature of the procedure, as well as the risks, benefits, and alternatives were carefully and thoroughly reviewed with the patient. Ample time for discussion and questions allowed. The patient understood, was satisfied, and agreed to proceed.  3.  Hold Eliquis the day prior to the procedure and the day of the procedure.  Anticipate resumption immediately post procedure pending procedure results.  The pros and cons of interruption of anticoagulation therapy reviewed.  She is in favor, particular given bleeding issues with urologic intervention on anticoagulation. 4.  May benefit from empiric PPI therapy.  We discussed the

## 2022-03-10 ENCOUNTER — Telehealth: Payer: Self-pay

## 2022-03-10 NOTE — Telephone Encounter (Signed)
Attempted f/u call. No answer, left VM. 

## 2022-03-10 NOTE — Telephone Encounter (Signed)
  Follow up Call-     03/09/2022    8:54 AM  Call back number  Post procedure Call Back phone  # [phone]  Permission to leave phone message Yes     Patient questions:  Do you have a fever, pain , or abdominal swelling? No. Pain Score  0 *  Have you tolerated food without any problems? Yes.    Have you been able to return to your normal activities? Yes.    Do you have any questions about your discharge instructions: Diet   No. Medications  No. Follow up visit  No.  Do you have questions or concerns about your Care? No.  Actions: * If pain score is 4 or above: No action needed, pain <4.

## 2022-03-13 ENCOUNTER — Encounter: Payer: Self-pay | Admitting: Internal Medicine

## 2022-03-20 ENCOUNTER — Ambulatory Visit
Admission: RE | Admit: 2022-03-20 | Discharge: 2022-03-20 | Disposition: A | Discharge status: Home / Self Care | Payer: PPO | Source: Ambulatory Visit | Attending: Internal Medicine | Admitting: Internal Medicine

## 2022-03-20 DIAGNOSIS — Z1231 Encounter for screening mammogram for malignant neoplasm of breast: Secondary | ICD-10-CM | POA: Diagnosis not present

## 2022-03-22 ENCOUNTER — Other Ambulatory Visit: Payer: Self-pay | Admitting: Cardiovascular Disease

## 2022-04-07 ENCOUNTER — Encounter: Payer: Self-pay | Admitting: Cardiovascular Disease

## 2022-04-07 ENCOUNTER — Other Ambulatory Visit: Payer: Self-pay | Admitting: Cardiovascular Disease

## 2022-04-07 ENCOUNTER — Ambulatory Visit: Payer: PPO | Admitting: Cardiovascular Disease

## 2022-04-07 VITALS — BP 132/78 | HR 70 | Ht 65.5 in | Wt 144.4 lb

## 2022-04-07 DIAGNOSIS — I341 Nonrheumatic mitral (valve) prolapse: Secondary | ICD-10-CM

## 2022-04-07 MED ORDER — ROSUVASTATIN CALCIUM 10 MG PO TABS: 10.0000 mg | ORAL_TABLET | Freq: Every evening | ORAL | 3 refills | Status: DC

## 2022-04-07 MED ORDER — FLECAINIDE ACETATE 100 MG PO TABS: 100.0000 mg | ORAL_TABLET | Freq: Two times a day (BID) | ORAL | 3 refills | Status: DC

## 2022-04-07 MED ORDER — DILTIAZEM HCL ER COATED BEADS 120 MG PO CP24: ORAL_CAPSULE | ORAL | 3 refills | Status: DC

## 2022-04-07 MED ORDER — APIXABAN 5 MG PO TABS: 5.0000 mg | ORAL_TABLET | Freq: Two times a day (BID) | ORAL | 3 refills | Status: DC

## 2022-04-07 NOTE — Patient Instructions (Signed)
Medication Instructions:  REFILLED Rosuvastatin, Flecainide, Eliquis, Diltiazem *If you need a refill on your cardiac medications before your next appointment, please call your pharmacy*   Lab Work: NONE If you have labs (blood work) drawn today and your tests are completely normal, you will receive your results only by: [place] (if you have MyChart) OR A paper copy in the mail If you have any lab test that is abnormal or we need to change your treatment, we will call you to review the results.   Testing/Procedures: NONE   Follow-Up: At [facility], you and your health needs are our priority.  As part of our continuing mission to provide you with exceptional heart care, we have created designated Provider Care Teams.  These Care Teams include your primary Cardiologist (physician) and Advanced Practice Providers (APPs -  Physician Assistants and Nurse Practitioners) who all work together to provide you with the care you need, when you need it.  Your next appointment:   1 year(s)  The format for your next appointment:   In Person  Provider:   Ronn Melena or Nah[place] {     Important Information About Sugar

## 2022-04-07 NOTE — Progress Notes (Signed)
Cardiology Office Note   Date:  04/07/2022   ID:  [name], DOB 10-12-1940, MRN [id number]  PCP:  Crist Infante, MD  Cardiologist:   Jenna Moores, MD   (former Ron Parker patient )   Chief Complaint  Patient presents with   Atrial Fibrillation        Problem List 1. Paroxysmal atrial fib  2. Hyperlipidemia 3. Mitral valve prolapse with mild MR     [name] is a 81 y.o. female who presents to establish care. She has seen Dr. Ron Parker in the past. Has a hx of paroxysmal atrial fib   She has been stable on Flecainide  Oct. 5, 2017:  [name] is seen for follow up of her PAF  Spent the summer in [place]  Has been keeping up with her BP.  January 25, 2017  Seen for follow up of her atrial fib. Presented to the ER with rapid atrial fib.   Received IV Dilt and was cardioverted in the ER .  Was seen in the atrial fib clinic - Flecainide was increased to 100 mg BID  Has felt well since then  We discussed Afib ablation   Mar 01, 2018:  [name] is doing well.  She has maintained normal sinus rhythm.  She is tolerating flecainide 100 mg twice a day. She remains very active and exercises regularly. Watches her salt .  March 11, 2020: [name] is seen today for follow-up of her paroxysmal atrial fibrillation. She has been fairly well controlled  Has been active.  Goes to [employer] regularly  Husband [name] is being treated for pulmonary fibrosis   March 22, 2021: [name] is seen today for follow up of her PAF.  Has a house at [employer].  No CP Had some hematura recently - required ureteral stenting . Reduced eliquis for several days.   April 07, 2022: [name] is seen today for follow-up of her paroxysmal atrial fibrillation. Husband [name] has been treated for lymphoma and prostate cancer   They have moved to [place] They have a 36 month grandson.    Past Medical History:  Diagnosis Date   Abnormal Pap smear dysplasiai mild   Aortic  atherosclerosis (HCC)    Atrial fibrillation ([place])    Cancer ([facility]) 2017   melanoma removed from back of left arm sees dermatology q 6 months   Drug therapy    Flecainide therapy started March, 2011, treadmill March, 2011, no exercise-induced ectopy   Hematuria    History of kidney stones    Hyperlipidemia    Melanoma ([facility])    left arm   Mitral regurgitation    Mild, echo, October, 2009   MVP (mitral valve prolapse)    per pt "mild"   Perirectal abscess    Varicose veins of left lower extremity     Past Surgical History:  Procedure Laterality Date   COLONOSCOPY  yrs ago   CYSTOSCOPY/URETEROSCOPY/HOLMIUM LASER/STENT PLACEMENT Bilateral 12/31/2020   Procedure: CYSTOSCOPY/URETEROSCOPY/STENT PLACEMENT/basketing of stone;  Surgeon: Alexis Frock, MD;  Location: [facility];  Service: Urology;  Laterality: Bilateral;   DILATATION & CURRETTAGE/HYSTEROSCOPY WITH RESECTOCOPE  08/20/2012   Procedure: [place];  Surgeon: Lyman Speller, MD;  Location: [facility];  Service: Gynecology;  Laterality: N/A;   GYNECOLOGIC CRYOSURGERY  1980's   HYSTEROSCOPY  11/3   polyp resection   TONSILLECTOMY AND ADENOIDECTOMY  ahe 5 and 1/2   varicose vein ablation  yrs ago  left     Current Outpatient Medications  Medication Sig Dispense Refill   acetaminophen (TYLENOL) 500 MG tablet Take 500-1,000 mg by mouth every 6 (six) hours as needed for moderate pain or headache.     B Complex Vitamins (VITAMIN B COMPLEX) TABS Take 1 tablet by mouth at bedtime.     estradiol (ESTRING) 2 MG vaginal ring Place 2 mg vaginally every 3 (three) months. 1 each 4   Magnesium 500 MG TABS Take 1 tablet by mouth every other day. In PM     Multiple Vitamin (MULTIVITAMIN) capsule Take 1 capsule by mouth at bedtime.     Multiple Vitamins-Minerals (ICAPS AREDS 2 PO) Take by mouth 2 (two) times daily.     omeprazole (PRILOSEC) 40 MG capsule Take 1 capsule (40 mg total)  by mouth daily. 30 capsule 11   PREVIDENT 5000 BOOSTER PLUS 1.1 % PSTE Uses q week  4   Vitamin D, Ergocalciferol, (DRISDOL) 1.25 MG (50000 UNIT) CAPS capsule Take 1 capsule (50,000 Units total) by mouth every 7 (seven) days. 12 capsule 4   apixaban (ELIQUIS) 5 MG TABS tablet Take 1 tablet (5 mg total) by mouth 2 (two) times daily. 180 tablet 3   diltiazem (CARDIZEM CD) 120 MG 24 hr capsule TAKE 1 CAPSULE BY MOUTH EVERY DAY 90 capsule 3   flecainide (TAMBOCOR) 100 MG tablet Take 1 tablet (100 mg total) by mouth every 12 (twelve) hours. 180 tablet 3   rosuvastatin (CRESTOR) 10 MG tablet Take 1 tablet (10 mg total) by mouth every evening. 90 tablet 3   No current facility-administered medications for this visit.    Allergies:   Patient has no known allergies.    Social History:  The patient  reports that she has never smoked. She has never used smokeless tobacco. She reports current alcohol use. She reports that she does not use drugs.   Family History:  The patient's family history includes Breast cancer in her sister; Breast cancer (age of onset: 77) in her maternal grandmother; Parkinsonism in her father.   ROS: Noted in current history, otherwise review of systems is negative.   Physical Exam: Blood pressure 132/78, pulse 70, height 5' 5.5" (1.664 m), weight 144 lb 6.4 oz (65.5 kg), last menstrual period 10/02/1997, SpO2 96 %.  GEN:  Well nourished, well developed in no acute distress HEENT: Normal NECK: No JVD; No carotid bruits LYMPHATICS: No lymphadenopathy CARDIAC: RRR ,  soft systolic murmur  RESPIRATORY:  Clear to auscultation without rales, wheezing or rhonchi  ABDOMEN: Soft, non-tender, non-distended MUSCULOSKELETAL:  No edema; No deformity  SKIN: Warm and dry NEUROLOGIC:  Alert and oriented x 3    EKG:     April 07, 2022:  NSR at 70.      Recent Labs: No results found for requested labs within last 365 days.    Lipid Panel No results found for: "CHOL", "TRIG",  "HDL", "CHOLHDL", "VLDL", "LDLCALC", "LDLDIRECT"    Wt Readings from Last 3 Encounters:  04/07/22 144 lb 6.4 oz (65.5 kg)  03/09/22 149 lb (67.6 kg)  01/02/22 142 lb 9.6 oz (64.7 kg)      Other studies Reviewed: Additional studies/ records that were reviewed today include: . Review of the above records demonstrates:    ASSESSMENT AND PLAN:  1. Paroxysmal atrial fib -    is in NSR today  Cont flecainide and cardizem 120 mg a day ,  cont eliquis    2. Hyperlipidemia-  3. Mitral valve prolapse with mild MR -    Very stable , soft systoli murmur  Cont current meds / plan       Current medicines are reviewed at length with the patient today.  The patient does not have concerns regarding medicines.  The following changes have been made:  no change  Labs/ tests ordered today include:   Orders Placed This Encounter  Procedures   EKG 12-Lead      Disposition:   FU in 1 year       Jenna Moores, MD  04/07/2022 1:19 PM    [facility] [place], [place], [place]  [address] Phone: [phone]; Fax: [phone]

## 2022-06-06 DIAGNOSIS — H353132 Nonexudative age-related macular degeneration, bilateral, intermediate dry stage: Secondary | ICD-10-CM | POA: Diagnosis not present

## 2022-06-06 DIAGNOSIS — H2513 Age-related nuclear cataract, bilateral: Secondary | ICD-10-CM | POA: Diagnosis not present

## 2022-06-06 DIAGNOSIS — H35352 Cystoid macular degeneration, left eye: Secondary | ICD-10-CM | POA: Diagnosis not present

## 2022-06-06 DIAGNOSIS — H35373 Puckering of macula, bilateral: Secondary | ICD-10-CM | POA: Diagnosis not present

## 2022-06-06 DIAGNOSIS — H52203 Unspecified astigmatism, bilateral: Secondary | ICD-10-CM | POA: Diagnosis not present

## 2022-06-29 DIAGNOSIS — H2512 Age-related nuclear cataract, left eye: Secondary | ICD-10-CM | POA: Diagnosis not present

## 2022-06-29 DIAGNOSIS — H269 Unspecified cataract: Secondary | ICD-10-CM | POA: Diagnosis not present

## 2022-07-08 DIAGNOSIS — Z23 Encounter for immunization: Secondary | ICD-10-CM | POA: Diagnosis not present

## 2022-08-17 DIAGNOSIS — H269 Unspecified cataract: Secondary | ICD-10-CM | POA: Diagnosis not present

## 2022-08-17 DIAGNOSIS — H2511 Age-related nuclear cataract, right eye: Secondary | ICD-10-CM | POA: Diagnosis not present

## 2022-08-28 DIAGNOSIS — D3002 Benign neoplasm of left kidney: Secondary | ICD-10-CM | POA: Diagnosis not present

## 2022-08-28 DIAGNOSIS — N200 Calculus of kidney: Secondary | ICD-10-CM | POA: Diagnosis not present

## 2022-08-28 DIAGNOSIS — R310 Gross hematuria: Secondary | ICD-10-CM | POA: Diagnosis not present

## 2022-09-19 DIAGNOSIS — H35373 Puckering of macula, bilateral: Secondary | ICD-10-CM | POA: Diagnosis not present

## 2022-09-19 DIAGNOSIS — H353132 Nonexudative age-related macular degeneration, bilateral, intermediate dry stage: Secondary | ICD-10-CM | POA: Diagnosis not present

## 2022-09-19 DIAGNOSIS — Z961 Presence of intraocular lens: Secondary | ICD-10-CM | POA: Diagnosis not present

## 2022-10-05 DIAGNOSIS — E559 Vitamin D deficiency, unspecified: Secondary | ICD-10-CM | POA: Diagnosis not present

## 2022-10-05 DIAGNOSIS — I700 Atherosclerosis of aorta: Secondary | ICD-10-CM | POA: Diagnosis not present

## 2022-10-05 DIAGNOSIS — R7301 Impaired fasting glucose: Secondary | ICD-10-CM | POA: Diagnosis not present

## 2022-10-05 DIAGNOSIS — E785 Hyperlipidemia, unspecified: Secondary | ICD-10-CM | POA: Diagnosis not present

## 2022-10-09 DIAGNOSIS — R82998 Other abnormal findings in urine: Secondary | ICD-10-CM | POA: Diagnosis not present

## 2022-10-09 DIAGNOSIS — E559 Vitamin D deficiency, unspecified: Secondary | ICD-10-CM | POA: Diagnosis not present

## 2022-10-09 DIAGNOSIS — I700 Atherosclerosis of aorta: Secondary | ICD-10-CM | POA: Diagnosis not present

## 2022-10-09 DIAGNOSIS — M8580 Other specified disorders of bone density and structure, unspecified site: Secondary | ICD-10-CM | POA: Diagnosis not present

## 2022-10-09 DIAGNOSIS — I480 Paroxysmal atrial fibrillation: Secondary | ICD-10-CM | POA: Diagnosis not present

## 2022-10-09 DIAGNOSIS — E785 Hyperlipidemia, unspecified: Secondary | ICD-10-CM | POA: Diagnosis not present

## 2022-10-09 DIAGNOSIS — Z1331 Encounter for screening for depression: Secondary | ICD-10-CM | POA: Diagnosis not present

## 2022-10-09 DIAGNOSIS — Z1389 Encounter for screening for other disorder: Secondary | ICD-10-CM | POA: Diagnosis not present

## 2022-10-09 DIAGNOSIS — R3121 Asymptomatic microscopic hematuria: Secondary | ICD-10-CM | POA: Diagnosis not present

## 2022-10-09 DIAGNOSIS — I340 Nonrheumatic mitral (valve) insufficiency: Secondary | ICD-10-CM | POA: Diagnosis not present

## 2022-10-09 DIAGNOSIS — R7301 Impaired fasting glucose: Secondary | ICD-10-CM | POA: Diagnosis not present

## 2022-10-09 DIAGNOSIS — D6869 Other thrombophilia: Secondary | ICD-10-CM | POA: Diagnosis not present

## 2022-10-09 DIAGNOSIS — Z0000 Encounter for general adult medical examination without abnormal findings: Secondary | ICD-10-CM | POA: Diagnosis not present

## 2022-10-31 DIAGNOSIS — M8589 Other specified disorders of bone density and structure, multiple sites: Secondary | ICD-10-CM | POA: Diagnosis not present

## 2022-11-17 DIAGNOSIS — R311 Benign essential microscopic hematuria: Secondary | ICD-10-CM | POA: Diagnosis not present

## 2022-11-17 DIAGNOSIS — R310 Gross hematuria: Secondary | ICD-10-CM | POA: Diagnosis not present

## 2022-11-17 DIAGNOSIS — D3002 Benign neoplasm of left kidney: Secondary | ICD-10-CM | POA: Diagnosis not present

## 2022-11-17 DIAGNOSIS — N200 Calculus of kidney: Secondary | ICD-10-CM | POA: Diagnosis not present

## 2022-12-18 DIAGNOSIS — R310 Gross hematuria: Secondary | ICD-10-CM | POA: Diagnosis not present

## 2022-12-18 DIAGNOSIS — D3002 Benign neoplasm of left kidney: Secondary | ICD-10-CM | POA: Diagnosis not present

## 2022-12-18 DIAGNOSIS — N200 Calculus of kidney: Secondary | ICD-10-CM | POA: Diagnosis not present

## 2023-01-05 ENCOUNTER — Other Ambulatory Visit: Payer: Self-pay | Admitting: Internal Medicine

## 2023-01-16 ENCOUNTER — Telehealth (HOSPITAL_BASED_OUTPATIENT_CLINIC_OR_DEPARTMENT_OTHER): Payer: Self-pay | Admitting: Obstetrics & Gynecology

## 2023-01-16 NOTE — Telephone Encounter (Signed)
Patient called and stated she would like to come in she having a lot of discharge .

## 2023-01-16 NOTE — Telephone Encounter (Signed)
Patient called and stated she would like to be seen for discharge .

## 2023-01-17 ENCOUNTER — Other Ambulatory Visit (HOSPITAL_COMMUNITY)
Admission: RE | Admit: 2023-01-17 | Discharge: 2023-01-17 | Disposition: A | Discharge status: Home / Self Care | Payer: PPO | Source: Ambulatory Visit | Attending: Obstetrics & Gynecology | Admitting: Obstetrics & Gynecology

## 2023-01-17 ENCOUNTER — Ambulatory Visit (HOSPITAL_BASED_OUTPATIENT_CLINIC_OR_DEPARTMENT_OTHER): Payer: PPO

## 2023-01-17 DIAGNOSIS — N898 Other specified noninflammatory disorders of vagina: Secondary | ICD-10-CM | POA: Diagnosis not present

## 2023-01-17 NOTE — Progress Notes (Signed)
Patient came in today to give an aptima self swab. Patient is complaining of vaginal discharge. tbw

## 2023-01-18 NOTE — Telephone Encounter (Signed)
Pt came into office for appt 

## 2023-01-19 ENCOUNTER — Telehealth (HOSPITAL_BASED_OUTPATIENT_CLINIC_OR_DEPARTMENT_OTHER): Payer: Self-pay | Admitting: Obstetrics & Gynecology

## 2023-01-19 LAB — CERVICOVAGINAL ANCILLARY ONLY
Bacterial Vaginitis (gardnerella): POSITIVE — AB
Candida Glabrata: NEGATIVE
Candida Vaginitis: NEGATIVE
Comment: NEGATIVE
Comment: NEGATIVE
Comment: NEGATIVE

## 2023-01-19 NOTE — Telephone Encounter (Signed)
Patient  called and would like for Tonya to give her call back,

## 2023-01-20 MED ORDER — METRONIDAZOLE 0.75 % VA GEL: 1.0000 | Freq: Every day | VAGINAL | 0 refills | Status: DC

## 2023-01-20 NOTE — Addendum Note (Signed)
Addended by: Jerene Bears on: 01/20/2023 06:39 AM   Modules accepted: Orders

## 2023-01-22 ENCOUNTER — Telehealth (HOSPITAL_BASED_OUTPATIENT_CLINIC_OR_DEPARTMENT_OTHER): Payer: Self-pay

## 2023-01-22 ENCOUNTER — Other Ambulatory Visit: Payer: Self-pay | Admitting: Cardiovascular Disease

## 2023-01-22 DIAGNOSIS — I341 Nonrheumatic mitral (valve) prolapse: Secondary | ICD-10-CM

## 2023-01-22 NOTE — Telephone Encounter (Signed)
Entered in Error

## 2023-01-23 ENCOUNTER — Other Ambulatory Visit (HOSPITAL_BASED_OUTPATIENT_CLINIC_OR_DEPARTMENT_OTHER): Payer: Self-pay | Admitting: Obstetrics & Gynecology

## 2023-01-23 DIAGNOSIS — E559 Vitamin D deficiency, unspecified: Secondary | ICD-10-CM

## 2023-01-23 MED ORDER — VITAMIN D (ERGOCALCIFEROL) 1.25 MG (50000 UNIT) PO CAPS: 50000.0000 [IU] | ORAL_CAPSULE | ORAL | 4 refills | Status: DC

## 2023-01-29 ENCOUNTER — Other Ambulatory Visit (HOSPITAL_COMMUNITY)
Admission: RE | Admit: 2023-01-29 | Discharge: 2023-01-29 | Disposition: A | Discharge status: Home / Self Care | Payer: PPO | Source: Ambulatory Visit | Attending: Obstetrics & Gynecology | Admitting: Obstetrics & Gynecology

## 2023-01-29 ENCOUNTER — Ambulatory Visit (INDEPENDENT_AMBULATORY_CARE_PROVIDER_SITE_OTHER): Payer: PPO | Admitting: *Deleted

## 2023-01-29 VITALS — BP 154/82 | HR 74 | Ht 64.0 in | Wt 144.0 lb

## 2023-01-29 DIAGNOSIS — N898 Other specified noninflammatory disorders of vagina: Secondary | ICD-10-CM

## 2023-01-29 NOTE — Progress Notes (Signed)
Pt presents to office with complaints of vaginal discharge. She states that she took the metrogel and the symptoms cleared but then came back after 2 days. Pt instructed on and performed self swab. Advised that she would be contacted once test has resulted.

## 2023-01-31 LAB — CERVICOVAGINAL ANCILLARY ONLY
Bacterial Vaginitis (gardnerella): NEGATIVE
Candida Glabrata: NEGATIVE
Candida Vaginitis: NEGATIVE
Comment: NEGATIVE
Comment: NEGATIVE
Comment: NEGATIVE

## 2023-02-12 ENCOUNTER — Other Ambulatory Visit: Payer: Self-pay | Admitting: Cardiovascular Disease

## 2023-02-12 DIAGNOSIS — I341 Nonrheumatic mitral (valve) prolapse: Secondary | ICD-10-CM

## 2023-02-27 ENCOUNTER — Other Ambulatory Visit: Payer: Self-pay | Admitting: Internal Medicine

## 2023-02-27 DIAGNOSIS — Z1231 Encounter for screening mammogram for malignant neoplasm of breast: Secondary | ICD-10-CM

## 2023-03-14 DIAGNOSIS — H353132 Nonexudative age-related macular degeneration, bilateral, intermediate dry stage: Secondary | ICD-10-CM | POA: Diagnosis not present

## 2023-03-14 DIAGNOSIS — H35373 Puckering of macula, bilateral: Secondary | ICD-10-CM | POA: Diagnosis not present

## 2023-04-09 ENCOUNTER — Ambulatory Visit (INDEPENDENT_AMBULATORY_CARE_PROVIDER_SITE_OTHER): Payer: PPO | Admitting: *Deleted

## 2023-04-09 ENCOUNTER — Other Ambulatory Visit (HOSPITAL_BASED_OUTPATIENT_CLINIC_OR_DEPARTMENT_OTHER): Payer: Self-pay | Admitting: Obstetrics & Gynecology

## 2023-04-09 VITALS — BP 142/74 | Wt 147.2 lb

## 2023-04-09 DIAGNOSIS — R319 Hematuria, unspecified: Secondary | ICD-10-CM | POA: Diagnosis not present

## 2023-04-09 DIAGNOSIS — R300 Dysuria: Secondary | ICD-10-CM

## 2023-04-09 DIAGNOSIS — N3090 Cystitis, unspecified without hematuria: Secondary | ICD-10-CM

## 2023-04-09 LAB — POCT URINALYSIS DIPSTICK
Bilirubin, UA: NEGATIVE
Glucose, UA: NEGATIVE
Ketones, UA: NEGATIVE
Nitrite, UA: NEGATIVE
Protein, UA: POSITIVE — AB
Spec Grav, UA: 1.015 (ref 1.010–1.025)
Urobilinogen, UA: 0.2 E.U./dL
pH, UA: 6.5 (ref 5.0–8.0)

## 2023-04-09 MED ORDER — NITROFURANTOIN MONOHYD MACRO 100 MG PO CAPS
100.0000 mg | ORAL_CAPSULE | Freq: Two times a day (BID) | ORAL | 0 refills | Status: DC
Start: 2023-04-09 — End: 2023-05-08

## 2023-04-09 MED ORDER — PHENAZOPYRIDINE HCL 100 MG PO TABS: 100.0000 mg | ORAL_TABLET | Freq: Three times a day (TID) | ORAL | 0 refills | Status: DC | PRN

## 2023-04-09 NOTE — Progress Notes (Signed)
Pt presents to office with complaints of burning with urination. Clean catch urine obtained. POCT positive for blood. Urine pink in color. Urine sent for culture and Rx sent to pharmacy for treatment. Pt to let us know if symptoms do not improve some over the next 24 hours.

## 2023-04-12 ENCOUNTER — Ambulatory Visit
Admission: RE | Admit: 2023-04-12 | Discharge: 2023-04-12 | Disposition: A | Discharge status: Home / Self Care | Payer: PPO | Source: Ambulatory Visit | Attending: Internal Medicine | Admitting: Internal Medicine

## 2023-04-12 DIAGNOSIS — Z1231 Encounter for screening mammogram for malignant neoplasm of breast: Secondary | ICD-10-CM | POA: Diagnosis not present

## 2023-04-12 LAB — URINE CULTURE

## 2023-04-13 ENCOUNTER — Other Ambulatory Visit (HOSPITAL_BASED_OUTPATIENT_CLINIC_OR_DEPARTMENT_OTHER): Payer: PPO

## 2023-04-13 NOTE — Addendum Note (Signed)
Addended by: Jerene Bears on: 04/13/2023 06:49 AM   Modules accepted: Orders

## 2023-04-16 ENCOUNTER — Other Ambulatory Visit (HOSPITAL_BASED_OUTPATIENT_CLINIC_OR_DEPARTMENT_OTHER): Payer: PPO

## 2023-04-16 DIAGNOSIS — N3090 Cystitis, unspecified without hematuria: Secondary | ICD-10-CM | POA: Diagnosis not present

## 2023-04-18 LAB — UA/M W/RFLX CULTURE, ROUTINE
Bilirubin, UA: NEGATIVE
Glucose, UA: NEGATIVE
Ketones, UA: NEGATIVE
Nitrite, UA: NEGATIVE
Specific Gravity, UA: 1.018 (ref 1.005–1.030)
Urobilinogen, Ur: 0.2 mg/dL (ref 0.2–1.0)
pH, UA: 5.5 (ref 5.0–7.5)

## 2023-04-18 LAB — URINE CULTURE, REFLEX: Organism ID, Bacteria: NO GROWTH

## 2023-04-18 LAB — MICROSCOPIC EXAMINATION
Bacteria, UA: NONE SEEN
Casts: NONE SEEN /lpf
RBC, Urine: >30 /hpf — AB (ref 0–2)
WBC, UA: >30 /hpf — AB (ref 0–5)

## 2023-04-21 ENCOUNTER — Other Ambulatory Visit: Payer: Self-pay | Admitting: Cardiovascular Disease

## 2023-04-21 DIAGNOSIS — I341 Nonrheumatic mitral (valve) prolapse: Secondary | ICD-10-CM

## 2023-04-23 NOTE — Telephone Encounter (Signed)
Prescription refill request for Eliquis received. Indication:afib Last office visit:7/23 ZOX:WRUEA labs Age: 82 Weight:66.8  kg  Prescription refilled

## 2023-05-07 ENCOUNTER — Encounter: Payer: Self-pay | Admitting: Cardiovascular Disease

## 2023-05-07 NOTE — Progress Notes (Unsigned)
Cardiology Office Note   Date:  05/08/2023   ID:  [name], [name] April 13, 1941, MRN [id number]  PCP:  Rodrigo Ran, MD  Cardiologist:   Kristeen Miss, MD   (former Myrtis Ser patient )   Chief Complaint  Patient presents with   Mitral Valve Prolapse   Atrial Fibrillation        Problem List 1. Paroxysmal atrial fib  2. Hyperlipidemia 3. Mitral valve prolapse with mild MR     [name] is a 82 y.o. female who presents to establish care. She has seen Dr. Myrtis Ser in the past. Has a hx of paroxysmal atrial fib   She has been stable on Flecainide  Oct. 5, 2017:  [name] is seen for follow up of her PAF  Spent the summer in [place]  Has been keeping up with her BP.  January 25, 2017  Seen for follow up of her atrial fib. Presented to the ER with rapid atrial fib.   Received IV Dilt and was cardioverted in the ER .  Was seen in the atrial fib clinic - Flecainide was increased to 100 mg BID  Has felt well since then  We discussed Afib ablation   Mar 01, 2018:  [name] is doing well.  She has maintained normal sinus rhythm.  She is tolerating flecainide 100 mg twice a day. She remains very active and exercises regularly. Watches her salt .  March 11, 2020: [name] is seen today for follow-up of her paroxysmal atrial fibrillation. She has been fairly well controlled  Has been active.  Goes to [employer] regularly  Husband Ronaldo Miyamoto is being treated for pulmonary fibrosis   March 22, 2021: [name] is seen today for follow up of her PAF.  Has a house at [employer].  No CP Had some hematura recently - required ureteral stenting . Reduced eliquis for several days.   April 07, 2022: [name] is seen today for follow-up of her paroxysmal atrial fibrillation. Husband Ronaldo Miyamoto has been treated for lymphoma and prostate cancer   They have moved to [place] They have a 8 month grandson.  Aug. 6, 2024 [name] is seen today for follow up of her PAF Husband Ronaldo Miyamoto was   treated for lymphoma and prostate cancer , Now has developed pulmonary fibrosis    Moved to [place] last year  Still gets up to [employer] fairly often  She just turned 81 this year.  Her weight is 68 kg.  Creatinine is 0.8.  Eliquis dose is still 5 mg twice a day.  Refill her cardiac meds  Check Eliquis labs today ( BMP, CBC )   Past Medical History:  Diagnosis Date   Abnormal Pap smear dysplasiai mild   Aortic atherosclerosis (HCC)    Atrial fibrillation (HCC)    Cancer (HCC) 2017   melanoma removed from back of left arm sees dermatology q 6 months   Drug therapy    Flecainide therapy started March, 2011, treadmill March, 2011, no exercise-induced ectopy   Hematuria    History of kidney stones    Hyperlipidemia    Melanoma (HCC)    left arm   Mitral regurgitation    Mild, echo, October, 2009   MVP (mitral valve prolapse)    per pt "mild"   Perirectal abscess    Varicose veins of left lower extremity     Past Surgical History:  Procedure Laterality Date   COLONOSCOPY  yrs ago   CYSTOSCOPY/URETEROSCOPY/HOLMIUM LASER/STENT PLACEMENT Bilateral  12/31/2020   Procedure: CYSTOSCOPY/URETEROSCOPY/STENT PLACEMENT/basketing of stone;  Surgeon: Sebastian Ache, MD;  Location: [facility];  Service: Urology;  Laterality: Bilateral;   DILATATION & CURRETTAGE/HYSTEROSCOPY WITH RESECTOCOPE  08/20/2012   Procedure: DILATATION & CURETTAGE/HYSTEROSCOPY WITH RESECTOCOPE;  Surgeon: Annamaria Boots, MD;  Location: [facility];  Service: Gynecology;  Laterality: N/A;   GYNECOLOGIC CRYOSURGERY  1980's   HYSTEROSCOPY  11/3   polyp resection   TONSILLECTOMY AND ADENOIDECTOMY  ahe 5 and 1/2   varicose vein ablation  yrs ago   left     Current Outpatient Medications  Medication Sig Dispense Refill   acetaminophen (TYLENOL) 500 MG tablet Take 500-1,000 mg by mouth every 6 (six) hours as needed for moderate pain or headache.     B Complex Vitamins (VITAMIN B COMPLEX) TABS  Take 1 tablet by mouth at bedtime.     ibandronate (BONIVA) 150 MG tablet Take 150 mg by mouth every 30 (thirty) days. Take in the morning with a full glass of water, on an empty stomach, and do not take anything else by mouth or lie down for the next 30 min.     Magnesium 500 MG TABS Take 1 tablet by mouth every other day. In PM     Multiple Vitamin (MULTIVITAMIN) capsule Take 1 capsule by mouth at bedtime.     Multiple Vitamins-Minerals (ICAPS AREDS 2 PO) Take by mouth 2 (two) times daily.     omeprazole (PRILOSEC) 40 MG capsule TAKE 1 CAPSULE (40 MG TOTAL) BY MOUTH DAILY. 90 capsule 3   phenazopyridine (PYRIDIUM) 100 MG tablet Take 1 tablet (100 mg total) by mouth 3 (three) times daily as needed for pain. 9 tablet 0   PREVIDENT 5000 BOOSTER PLUS 1.1 % PSTE Uses q week  4   Vitamin D, Ergocalciferol, (DRISDOL) 1.25 MG (50000 UNIT) CAPS capsule Take 1 capsule (50,000 Units total) by mouth every 7 (seven) days. 12 capsule 4   apixaban (ELIQUIS) 5 MG TABS tablet Take 1 tablet (5 mg total) by mouth 2 (two) times daily. 90 tablet 3   diltiazem (CARDIZEM CD) 120 MG 24 hr capsule TAKE 1 CAPSULE BY MOUTH EVERY DAY 90 capsule 3   flecainide (TAMBOCOR) 100 MG tablet Take 1 tablet (100 mg total) by mouth every 12 (twelve) hours. 180 tablet 3   rosuvastatin (CRESTOR) 20 MG tablet Take 1 tablet (20 mg total) by mouth daily. 90 tablet 3   No current facility-administered medications for this visit.    Allergies:   Patient has no known allergies.    Social History:  The patient  reports that she has never smoked. She has never used smokeless tobacco. She reports current alcohol use. She reports that she does not use drugs.   Family History:  The patient's family history includes Breast cancer in her sister; Breast cancer (age of onset: 75) in her maternal grandmother; Parkinsonism in her father.   ROS: Noted in current history, otherwise review of systems is negative.   Physical Exam: Blood pressure  126/84, pulse 70, height 5\' 4"  (1.626 m), weight 150 lb (68 kg), last menstrual period 10/02/1997, SpO2 96%.       GEN:  Well nourished, well developed in no acute distress HEENT: Normal NECK: No JVD; No carotid bruits LYMPHATICS: No lymphadenopathy CARDIAC: RRR , soft systolic murmur  RESPIRATORY:  Clear to auscultation without rales, wheezing or rhonchi  ABDOMEN: Soft, non-tender, non-distended MUSCULOSKELETAL:  No edema; No deformity  SKIN: Warm and dry  NEUROLOGIC:  Alert and oriented x 3    EKG:       EKG Interpretation Date/Time:  Tuesday May 08 2023 15:28:46 EDT Ventricular Rate:  66 PR Interval:  186 QRS Duration:  88 QT Interval:  410 QTC Calculation: 429 R Axis:   66  Text Interpretation: Normal sinus rhythm Normal ECG When compared with ECG of 08-Jan-2017 09:43, T wave amplitude has decreased in Lateral leads Confirmed by Kristeen Miss ([id number]) on 05/08/2023 4:01:10 PM      Recent Labs: No results found for requested labs within last 365 days.    Lipid Panel No results found for: "CHOL", "TRIG", "HDL", "CHOLHDL", "VLDL", "LDLCALC", "LDLDIRECT"    Wt Readings from Last 3 Encounters:  05/08/23 150 lb (68 kg)  04/09/23 147 lb 3.2 oz (66.8 kg)  01/29/23 144 lb (65.3 kg)      Other studies Reviewed: Additional studies/ records that were reviewed today include: . Review of the above records demonstrates:    ASSESSMENT AND PLAN:  1. Paroxysmal atrial fib -     continue diltiazem and flecainide.  Continue Eliquis at current dose.  2. Hyperlipidemia-     continue current medications.  3. Mitral valve prolapse with mild MR -   Very soft systolic murmur.  Unchanged from previous exams.      Current medicines are reviewed at length with the patient today.  The patient does not have concerns regarding medicines.  The following changes have been made:  no change  Labs/ tests ordered today include:   Orders Placed This Encounter  Procedures   CBC    Basic metabolic panel   EKG 12-Lead      Disposition:   FU in 1 year       Kristeen Miss, MD  05/08/2023 4:02 PM    [facility] [address] [place], [place], [place]  [address] Phone: [phone]; Fax: [phone]

## 2023-05-08 ENCOUNTER — Encounter: Payer: Self-pay | Admitting: Cardiovascular Disease

## 2023-05-08 ENCOUNTER — Ambulatory Visit: Payer: PPO | Admitting: Cardiovascular Disease

## 2023-05-08 VITALS — BP 126/84 | HR 70 | Ht 64.0 in | Wt 150.0 lb

## 2023-05-08 DIAGNOSIS — Z79899 Other long term (current) drug therapy: Secondary | ICD-10-CM

## 2023-05-08 DIAGNOSIS — I480 Paroxysmal atrial fibrillation: Secondary | ICD-10-CM | POA: Diagnosis not present

## 2023-05-08 DIAGNOSIS — I341 Nonrheumatic mitral (valve) prolapse: Secondary | ICD-10-CM

## 2023-05-08 MED ORDER — APIXABAN 5 MG PO TABS
5.0000 mg | ORAL_TABLET | Freq: Two times a day (BID) | ORAL | 3 refills | Status: DC
Start: 2023-05-08 — End: 2023-06-05

## 2023-05-08 MED ORDER — FLECAINIDE ACETATE 100 MG PO TABS
100.0000 mg | ORAL_TABLET | Freq: Two times a day (BID) | ORAL | 3 refills | Status: DC
Start: 2023-05-08 — End: 2024-07-07

## 2023-05-08 MED ORDER — DILTIAZEM HCL ER COATED BEADS 120 MG PO CP24
ORAL_CAPSULE | ORAL | 3 refills | Status: DC
Start: 2023-05-08 — End: 2024-07-07

## 2023-05-08 MED ORDER — ROSUVASTATIN CALCIUM 20 MG PO TABS: 20.0000 mg | ORAL_TABLET | Freq: Every day | ORAL | 3 refills | Status: DC

## 2023-05-08 NOTE — Patient Instructions (Signed)
Medication Instructions:  Your physician recommends that you continue on your current medications as directed. Please refer to the Current Medication list given to you today.  *If you need a refill on your cardiac medications before your next appointment, please call your pharmacy*   Lab Work: CBC, BMET today If you have labs (blood work) drawn today and your tests are completely normal, you will receive your results only by: MyChart Message (if you have MyChart) OR A paper copy in the mail If you have any lab test that is abnormal or we need to change your treatment, we will call you to review the results.   Testing/Procedures: NONE   Follow-Up: At [facility], you and your health needs are our priority.  As part of our continuing mission to provide you with exceptional heart care, we have created designated Provider Care Teams.  These Care Teams include your primary Cardiologist (physician) and Advanced Practice Providers (APPs -  Physician Assistants and Nurse Practitioners) who all work together to provide you with the care you need, when you need it.  We recommend signing up for the patient portal called "MyChart".  Sign up information is provided on this After Visit Summary.  MyChart is used to connect with patients for Virtual Visits (Telemedicine).  Patients are able to view lab/test results, encounter notes, upcoming appointments, etc.  Non-urgent messages can be sent to your provider as well.   To learn more about what you can do with MyChart, go to [email or web address].    Your next appointment:   1 year(s)  Provider:   Kristeen Miss, MD

## 2023-06-02 ENCOUNTER — Encounter: Payer: Self-pay | Admitting: Cardiovascular Disease

## 2023-06-02 DIAGNOSIS — I341 Nonrheumatic mitral (valve) prolapse: Secondary | ICD-10-CM

## 2023-06-05 MED ORDER — APIXABAN 5 MG PO TABS: 5.0000 mg | ORAL_TABLET | Freq: Two times a day (BID) | ORAL | 1 refills | Status: DC

## 2023-06-18 DIAGNOSIS — E559 Vitamin D deficiency, unspecified: Secondary | ICD-10-CM | POA: Diagnosis not present

## 2023-06-18 DIAGNOSIS — E785 Hyperlipidemia, unspecified: Secondary | ICD-10-CM | POA: Diagnosis not present

## 2023-06-18 DIAGNOSIS — I700 Atherosclerosis of aorta: Secondary | ICD-10-CM | POA: Diagnosis not present

## 2023-06-19 DIAGNOSIS — Z23 Encounter for immunization: Secondary | ICD-10-CM | POA: Diagnosis not present

## 2023-06-26 DIAGNOSIS — I340 Nonrheumatic mitral (valve) insufficiency: Secondary | ICD-10-CM | POA: Diagnosis not present

## 2023-06-26 DIAGNOSIS — R3121 Asymptomatic microscopic hematuria: Secondary | ICD-10-CM | POA: Diagnosis not present

## 2023-06-26 DIAGNOSIS — C439 Malignant melanoma of skin, unspecified: Secondary | ICD-10-CM | POA: Diagnosis not present

## 2023-06-26 DIAGNOSIS — D6869 Other thrombophilia: Secondary | ICD-10-CM | POA: Diagnosis not present

## 2023-06-26 DIAGNOSIS — M8580 Other specified disorders of bone density and structure, unspecified site: Secondary | ICD-10-CM | POA: Diagnosis not present

## 2023-06-26 DIAGNOSIS — Z23 Encounter for immunization: Secondary | ICD-10-CM | POA: Diagnosis not present

## 2023-06-26 DIAGNOSIS — I480 Paroxysmal atrial fibrillation: Secondary | ICD-10-CM | POA: Diagnosis not present

## 2023-06-26 DIAGNOSIS — R7301 Impaired fasting glucose: Secondary | ICD-10-CM | POA: Diagnosis not present

## 2023-06-26 DIAGNOSIS — E559 Vitamin D deficiency, unspecified: Secondary | ICD-10-CM | POA: Diagnosis not present

## 2023-06-26 DIAGNOSIS — I700 Atherosclerosis of aorta: Secondary | ICD-10-CM | POA: Diagnosis not present

## 2023-06-26 DIAGNOSIS — E785 Hyperlipidemia, unspecified: Secondary | ICD-10-CM | POA: Diagnosis not present

## 2023-09-21 DIAGNOSIS — H353132 Nonexudative age-related macular degeneration, bilateral, intermediate dry stage: Secondary | ICD-10-CM | POA: Diagnosis not present

## 2023-09-21 DIAGNOSIS — J069 Acute upper respiratory infection, unspecified: Secondary | ICD-10-CM | POA: Diagnosis not present

## 2023-09-21 DIAGNOSIS — Z961 Presence of intraocular lens: Secondary | ICD-10-CM | POA: Diagnosis not present

## 2023-09-21 DIAGNOSIS — I480 Paroxysmal atrial fibrillation: Secondary | ICD-10-CM | POA: Diagnosis not present

## 2023-09-21 DIAGNOSIS — H5213 Myopia, bilateral: Secondary | ICD-10-CM | POA: Diagnosis not present

## 2023-09-21 DIAGNOSIS — R051 Acute cough: Secondary | ICD-10-CM | POA: Diagnosis not present

## 2023-10-08 DIAGNOSIS — N200 Calculus of kidney: Secondary | ICD-10-CM | POA: Diagnosis not present

## 2023-10-08 DIAGNOSIS — D3002 Benign neoplasm of left kidney: Secondary | ICD-10-CM | POA: Diagnosis not present

## 2023-10-08 DIAGNOSIS — R310 Gross hematuria: Secondary | ICD-10-CM | POA: Diagnosis not present

## 2023-12-10 DIAGNOSIS — E559 Vitamin D deficiency, unspecified: Secondary | ICD-10-CM | POA: Diagnosis not present

## 2023-12-10 DIAGNOSIS — E785 Hyperlipidemia, unspecified: Secondary | ICD-10-CM | POA: Diagnosis not present

## 2023-12-10 DIAGNOSIS — R7301 Impaired fasting glucose: Secondary | ICD-10-CM | POA: Diagnosis not present

## 2023-12-10 DIAGNOSIS — M8580 Other specified disorders of bone density and structure, unspecified site: Secondary | ICD-10-CM | POA: Diagnosis not present

## 2023-12-11 DIAGNOSIS — R946 Abnormal results of thyroid function studies: Secondary | ICD-10-CM | POA: Diagnosis not present

## 2023-12-11 DIAGNOSIS — R7989 Other specified abnormal findings of blood chemistry: Secondary | ICD-10-CM | POA: Diagnosis not present

## 2023-12-17 DIAGNOSIS — D6869 Other thrombophilia: Secondary | ICD-10-CM | POA: Diagnosis not present

## 2023-12-17 DIAGNOSIS — R7301 Impaired fasting glucose: Secondary | ICD-10-CM | POA: Diagnosis not present

## 2023-12-17 DIAGNOSIS — E785 Hyperlipidemia, unspecified: Secondary | ICD-10-CM | POA: Diagnosis not present

## 2023-12-17 DIAGNOSIS — Z0000 Encounter for general adult medical examination without abnormal findings: Secondary | ICD-10-CM | POA: Diagnosis not present

## 2023-12-17 DIAGNOSIS — I700 Atherosclerosis of aorta: Secondary | ICD-10-CM | POA: Diagnosis not present

## 2023-12-17 DIAGNOSIS — I340 Nonrheumatic mitral (valve) insufficiency: Secondary | ICD-10-CM | POA: Diagnosis not present

## 2023-12-17 DIAGNOSIS — R82998 Other abnormal findings in urine: Secondary | ICD-10-CM | POA: Diagnosis not present

## 2023-12-17 DIAGNOSIS — Z1331 Encounter for screening for depression: Secondary | ICD-10-CM | POA: Diagnosis not present

## 2023-12-17 DIAGNOSIS — R946 Abnormal results of thyroid function studies: Secondary | ICD-10-CM | POA: Diagnosis not present

## 2023-12-17 DIAGNOSIS — Z1339 Encounter for screening examination for other mental health and behavioral disorders: Secondary | ICD-10-CM | POA: Diagnosis not present

## 2023-12-17 DIAGNOSIS — R3121 Asymptomatic microscopic hematuria: Secondary | ICD-10-CM | POA: Diagnosis not present

## 2023-12-17 DIAGNOSIS — I480 Paroxysmal atrial fibrillation: Secondary | ICD-10-CM | POA: Diagnosis not present

## 2023-12-17 DIAGNOSIS — M8580 Other specified disorders of bone density and structure, unspecified site: Secondary | ICD-10-CM | POA: Diagnosis not present

## 2023-12-23 ENCOUNTER — Other Ambulatory Visit: Payer: Self-pay | Admitting: Internal Medicine

## 2024-02-23 ENCOUNTER — Other Ambulatory Visit: Payer: Self-pay | Admitting: Cardiovascular Disease

## 2024-02-23 DIAGNOSIS — I341 Nonrheumatic mitral (valve) prolapse: Secondary | ICD-10-CM

## 2024-02-26 NOTE — Telephone Encounter (Signed)
 Pt last saw Dr Alroy Aspen 05/08/23, last labs 05/08/23 Creat 0.85, age 83, weight 68kg, based on specified criteria pt is on appropriate dosage of Eliquis  5mg  BID for afib.  Will refill rx.

## 2024-03-05 ENCOUNTER — Encounter: Payer: Self-pay | Admitting: Cardiovascular Disease

## 2024-03-06 ENCOUNTER — Other Ambulatory Visit: Payer: Self-pay | Admitting: Internal Medicine

## 2024-03-06 ENCOUNTER — Other Ambulatory Visit (HOSPITAL_BASED_OUTPATIENT_CLINIC_OR_DEPARTMENT_OTHER): Payer: Self-pay | Admitting: Obstetrics & Gynecology

## 2024-03-06 DIAGNOSIS — E559 Vitamin D deficiency, unspecified: Secondary | ICD-10-CM

## 2024-03-12 ENCOUNTER — Other Ambulatory Visit: Payer: Self-pay | Admitting: Internal Medicine

## 2024-03-12 DIAGNOSIS — Z1231 Encounter for screening mammogram for malignant neoplasm of breast: Secondary | ICD-10-CM

## 2024-03-14 DIAGNOSIS — H35373 Puckering of macula, bilateral: Secondary | ICD-10-CM | POA: Diagnosis not present

## 2024-03-14 DIAGNOSIS — D23111 Other benign neoplasm of skin of right upper eyelid, including canthus: Secondary | ICD-10-CM | POA: Diagnosis not present

## 2024-03-14 DIAGNOSIS — D23122 Other benign neoplasm of skin of left lower eyelid, including canthus: Secondary | ICD-10-CM | POA: Diagnosis not present

## 2024-03-14 DIAGNOSIS — Z961 Presence of intraocular lens: Secondary | ICD-10-CM | POA: Diagnosis not present

## 2024-03-14 DIAGNOSIS — H353132 Nonexudative age-related macular degeneration, bilateral, intermediate dry stage: Secondary | ICD-10-CM | POA: Diagnosis not present

## 2024-04-28 DIAGNOSIS — L820 Inflamed seborrheic keratosis: Secondary | ICD-10-CM | POA: Diagnosis not present

## 2024-04-29 ENCOUNTER — Ambulatory Visit
Admission: RE | Admit: 2024-04-29 | Discharge: 2024-04-29 | Disposition: A | Discharge status: Home / Self Care | Source: Ambulatory Visit | Attending: Internal Medicine | Admitting: Internal Medicine

## 2024-04-29 DIAGNOSIS — Z1231 Encounter for screening mammogram for malignant neoplasm of breast: Secondary | ICD-10-CM

## 2024-07-05 DIAGNOSIS — Z23 Encounter for immunization: Secondary | ICD-10-CM | POA: Diagnosis not present

## 2024-07-07 ENCOUNTER — Ambulatory Visit (HOSPITAL_BASED_OUTPATIENT_CLINIC_OR_DEPARTMENT_OTHER): Admitting: Cardiology

## 2024-07-07 ENCOUNTER — Encounter (HOSPITAL_BASED_OUTPATIENT_CLINIC_OR_DEPARTMENT_OTHER): Payer: Self-pay | Admitting: Cardiology

## 2024-07-07 VITALS — BP 130/74 | HR 75 | Ht 64.0 in | Wt 147.5 lb

## 2024-07-07 DIAGNOSIS — Z79899 Other long term (current) drug therapy: Secondary | ICD-10-CM | POA: Diagnosis not present

## 2024-07-07 DIAGNOSIS — Z7901 Long term (current) use of anticoagulants: Secondary | ICD-10-CM

## 2024-07-07 DIAGNOSIS — I480 Paroxysmal atrial fibrillation: Secondary | ICD-10-CM

## 2024-07-07 DIAGNOSIS — E7800 Pure hypercholesterolemia, unspecified: Secondary | ICD-10-CM

## 2024-07-07 DIAGNOSIS — I341 Nonrheumatic mitral (valve) prolapse: Secondary | ICD-10-CM

## 2024-07-07 DIAGNOSIS — I340 Nonrheumatic mitral (valve) insufficiency: Secondary | ICD-10-CM

## 2024-07-07 DIAGNOSIS — D6869 Other thrombophilia: Secondary | ICD-10-CM

## 2024-07-07 MED ORDER — APIXABAN 5 MG PO TABS
5.0000 mg | ORAL_TABLET | Freq: Two times a day (BID) | ORAL | 3 refills | Status: AC
Start: 2024-07-07 — End: ?

## 2024-07-07 MED ORDER — ROSUVASTATIN CALCIUM 20 MG PO TABS: 20.0000 mg | ORAL_TABLET | Freq: Every day | ORAL | 3 refills | Status: AC

## 2024-07-07 MED ORDER — FLECAINIDE ACETATE 100 MG PO TABS: 100.0000 mg | ORAL_TABLET | Freq: Two times a day (BID) | ORAL | 3 refills | Status: AC

## 2024-07-07 MED ORDER — DILTIAZEM HCL ER COATED BEADS 120 MG PO CP24: ORAL_CAPSULE | ORAL | 3 refills | Status: AC

## 2024-07-07 NOTE — Progress Notes (Signed)
 Cardiology Office Note:  .   Date:  07/07/2024  ID:  [name], DOB [birth year], MRN [id number] PCP: Shayne Anes, MD  [facility] Providers Cardiologist:  Shelda Bruckner, MD {  History of Present Illness: .   [name] is a 83 y.o. female with PMH mitral valve prolapse, atrial fibrillation, hyperlipidemia. She was previously followed by Dr. Alveta and established care with me on 07/07/24.  Pertinent CV history: Ca score in 2021 was 0. Echo 2016 with EF 55-60%, mild MR, no mention of prolapse.  Today: Overall she is doing well.  She can feel when she has afib, has not been in it for a long while. Tolerating medications well. Lives at [place], does chairfit exercises, walks her dog regularly. Her husband has pulmonary fibrosis and is on oxygen, so she does have to assist him as well.   ROS: Denies chest pain, shortness of breath at rest or with normal exertion. No PND, orthopnea, LE edema or unexpected weight gain. No syncope or palpitations. ROS otherwise negative except as noted.   Studies Reviewed: SABRA    EKG:  EKG Interpretation Date/Time:  Monday July 07 2024 09:13:31 EDT Ventricular Rate:  78 PR Interval:  188 QRS Duration:  92 QT Interval:  392 QTC Calculation: 446 R Axis:   11  Text Interpretation: Normal sinus rhythm Normal ECG When compared with ECG of 08-May-2023 15:28, No significant change was found Confirmed by Bruckner Shelda [phone]) on 07/07/2024 9:31:25 AM    Physical Exam:   VS:  BP 130/74   Pulse 75   Ht 5' 4 (1.626 m)   Wt 147 lb 8 oz (66.9 kg)   LMP 10/02/1997   SpO2 97%   BMI 25.32 kg/m    Wt Readings from Last 3 Encounters:  07/07/24 147 lb 8 oz (66.9 kg)  05/08/23 150 lb (68 kg)  04/09/23 147 lb 3.2 oz (66.8 kg)    GEN: Well nourished, well developed in no acute distress HEENT: Normal, moist mucous membranes NECK: No JVD CARDIAC: regular rhythm, normal S1 and S2, no rubs or gallops. No murmur. VASCULAR:  Radial and DP pulses 2+ bilaterally. No carotid bruits RESPIRATORY:  Clear to auscultation without rales, wheezing or rhonchi  ABDOMEN: Soft, non-tender, non-distended MUSCULOSKELETAL:  Ambulates independently SKIN: Warm and dry, no edema. Has spider veins bilaterally. NEUROLOGIC:  Alert and oriented x 3. No focal neuro deficits noted. PSYCHIATRIC:  Normal affect    ASSESSMENT AND PLAN: .    Paroxysmal atrial fibrillation Secondary hypercoagulable state -CHA2DS2/VAS Stroke Risk Points=3  -continue apixaban  for anticoagulation -has been chronically managed with diltiazem  and flecainide  -no history of ischemic heart disease. No stress test that I can see. Ca score in 2021 was 0 -needs q6 mos visits with ECG for flecainide , high risk medication use  Hyperlipidemia -Ca score of 0 at age 21. She has been on rosuvastatin  chronically. We discussed data on Ca score, pros/cons of keeping vs. Stopping statin. After shared decision making, she tolerates this well, and we will continue -las LDL 90, she is primary prevention, so this is at goal  Mitral valve prolapse Mild MR -last echo 2016 did not comment on prolapse, did note mild MR, EF 50-55% -no HF symptoms  Dispo: 6 mos with ECG for high risk medication monitoring  Signed, Shelda Bruckner, MD   Shelda Bruckner, MD, PhD, [facility] [place]  [facility]    Heart & Vascular at [facility] at [facility] [phone]  [address], [address] [place], [place] [address] [phone]

## 2024-07-07 NOTE — Patient Instructions (Signed)

## 2024-08-19 DIAGNOSIS — R946 Abnormal results of thyroid function studies: Secondary | ICD-10-CM | POA: Diagnosis not present

## 2025-01-12 ENCOUNTER — Ambulatory Visit (HOSPITAL_BASED_OUTPATIENT_CLINIC_OR_DEPARTMENT_OTHER): Admitting: Cardiology

## 2025-01-16 ENCOUNTER — Ambulatory Visit (HOSPITAL_BASED_OUTPATIENT_CLINIC_OR_DEPARTMENT_OTHER): Admitting: Cardiology

## 2025-01-19 ENCOUNTER — Ambulatory Visit (HOSPITAL_BASED_OUTPATIENT_CLINIC_OR_DEPARTMENT_OTHER): Admitting: Nurse Practitioner
